# Patient Record
Sex: Female | Born: 1945 | Race: White | Hispanic: No | Marital: Married | Smoking: Former smoker
Health system: Southern US, Community
[De-identification: ages and names within clinical notes are randomized; demographics above are authoritative.]

## PROBLEM LIST (undated history)

## (undated) DIAGNOSIS — I10 Essential (primary) hypertension: Secondary | ICD-10-CM

## (undated) DIAGNOSIS — Z9889 Other specified postprocedural states: Secondary | ICD-10-CM

## (undated) DIAGNOSIS — M1990 Unspecified osteoarthritis, unspecified site: Secondary | ICD-10-CM

## (undated) DIAGNOSIS — J302 Other seasonal allergic rhinitis: Secondary | ICD-10-CM

## (undated) DIAGNOSIS — G5600 Carpal tunnel syndrome, unspecified upper limb: Secondary | ICD-10-CM

## (undated) DIAGNOSIS — Z923 Personal history of irradiation: Secondary | ICD-10-CM

## (undated) DIAGNOSIS — T8859XA Other complications of anesthesia, initial encounter: Secondary | ICD-10-CM

## (undated) DIAGNOSIS — R112 Nausea with vomiting, unspecified: Secondary | ICD-10-CM

## (undated) DIAGNOSIS — G2581 Restless legs syndrome: Principal | ICD-10-CM

## (undated) DIAGNOSIS — D649 Anemia, unspecified: Secondary | ICD-10-CM

## (undated) DIAGNOSIS — M5417 Radiculopathy, lumbosacral region: Secondary | ICD-10-CM

## (undated) DIAGNOSIS — F419 Anxiety disorder, unspecified: Secondary | ICD-10-CM

## (undated) DIAGNOSIS — E785 Hyperlipidemia, unspecified: Secondary | ICD-10-CM

## (undated) DIAGNOSIS — E1140 Type 2 diabetes mellitus with diabetic neuropathy, unspecified: Principal | ICD-10-CM

## (undated) DIAGNOSIS — K219 Gastro-esophageal reflux disease without esophagitis: Secondary | ICD-10-CM

## (undated) HISTORY — DX: Hyperlipidemia, unspecified: E78.5

## (undated) HISTORY — DX: Radiculopathy, lumbosacral region: M54.17

## (undated) HISTORY — PX: KNEE SURGERY: SHX244

## (undated) HISTORY — DX: Essential (primary) hypertension: I10

## (undated) HISTORY — DX: Carpal tunnel syndrome, unspecified upper limb: G56.00

## (undated) HISTORY — PX: LUMBAR DISC SURGERY: SHX700

## (undated) HISTORY — DX: Type 2 diabetes mellitus with diabetic neuropathy, unspecified: E11.40

## (undated) HISTORY — PX: LAMINECTOMY: SHX219

## (undated) HISTORY — DX: Restless legs syndrome: G25.81

---

## 1978-08-31 HISTORY — PX: OTHER SURGICAL HISTORY: SHX169

## 1996-08-31 HISTORY — PX: FINGER SURGERY: SHX640

## 1998-05-08 ENCOUNTER — Other Ambulatory Visit: Admission: RE | Admit: 1998-05-08 | Discharge: 1998-05-08 | Payer: Self-pay | Admitting: Obstetrics and Gynecology

## 1999-05-30 ENCOUNTER — Other Ambulatory Visit: Admission: RE | Admit: 1999-05-30 | Discharge: 1999-05-30 | Payer: Self-pay | Admitting: Obstetrics and Gynecology

## 1999-06-30 ENCOUNTER — Encounter: Payer: Self-pay | Admitting: Obstetrics and Gynecology

## 1999-06-30 ENCOUNTER — Encounter: Admission: RE | Admit: 1999-06-30 | Discharge: 1999-06-30 | Payer: Self-pay | Admitting: Obstetrics and Gynecology

## 2000-06-07 ENCOUNTER — Other Ambulatory Visit: Admission: RE | Admit: 2000-06-07 | Discharge: 2000-06-07 | Payer: Self-pay | Admitting: Obstetrics and Gynecology

## 2001-06-15 ENCOUNTER — Other Ambulatory Visit: Admission: RE | Admit: 2001-06-15 | Discharge: 2001-06-15 | Payer: Self-pay | Admitting: Obstetrics and Gynecology

## 2001-06-21 ENCOUNTER — Encounter: Admission: RE | Admit: 2001-06-21 | Discharge: 2001-06-21 | Payer: Self-pay | Admitting: Obstetrics and Gynecology

## 2001-06-21 ENCOUNTER — Encounter: Payer: Self-pay | Admitting: Obstetrics and Gynecology

## 2002-07-03 ENCOUNTER — Encounter: Payer: Self-pay | Admitting: Obstetrics and Gynecology

## 2002-07-03 ENCOUNTER — Encounter: Admission: RE | Admit: 2002-07-03 | Discharge: 2002-07-03 | Payer: Self-pay | Admitting: Obstetrics and Gynecology

## 2002-08-03 ENCOUNTER — Other Ambulatory Visit: Admission: RE | Admit: 2002-08-03 | Discharge: 2002-08-03 | Payer: Self-pay | Admitting: Obstetrics and Gynecology

## 2003-09-18 ENCOUNTER — Other Ambulatory Visit: Payer: Self-pay

## 2004-01-16 ENCOUNTER — Other Ambulatory Visit: Admission: RE | Admit: 2004-01-16 | Discharge: 2004-01-16 | Payer: Self-pay | Admitting: Obstetrics and Gynecology

## 2004-02-08 ENCOUNTER — Encounter: Admission: RE | Admit: 2004-02-08 | Discharge: 2004-02-08 | Payer: Self-pay | Admitting: Obstetrics and Gynecology

## 2005-02-17 ENCOUNTER — Encounter: Admission: RE | Admit: 2005-02-17 | Discharge: 2005-02-17 | Payer: Self-pay | Admitting: Obstetrics and Gynecology

## 2005-04-09 ENCOUNTER — Encounter: Admission: RE | Admit: 2005-04-09 | Discharge: 2005-04-09 | Payer: Self-pay | Admitting: Family Medicine

## 2006-03-29 ENCOUNTER — Encounter: Admission: RE | Admit: 2006-03-29 | Discharge: 2006-03-29 | Payer: Self-pay | Admitting: Obstetrics and Gynecology

## 2007-04-08 ENCOUNTER — Encounter: Admission: RE | Admit: 2007-04-08 | Discharge: 2007-04-08 | Payer: Self-pay | Admitting: Obstetrics and Gynecology

## 2007-04-26 ENCOUNTER — Other Ambulatory Visit: Payer: Self-pay

## 2007-04-26 ENCOUNTER — Ambulatory Visit: Payer: Self-pay | Admitting: General Practice

## 2007-05-11 ENCOUNTER — Inpatient Hospital Stay: Payer: Self-pay | Admitting: General Practice

## 2008-04-09 ENCOUNTER — Encounter: Admission: RE | Admit: 2008-04-09 | Discharge: 2008-04-09 | Payer: Self-pay | Admitting: Family Medicine

## 2008-04-23 ENCOUNTER — Encounter: Admission: RE | Admit: 2008-04-23 | Discharge: 2008-04-23 | Payer: Self-pay | Admitting: Family Medicine

## 2009-04-29 ENCOUNTER — Encounter: Admission: RE | Admit: 2009-04-29 | Discharge: 2009-04-29 | Payer: Self-pay | Admitting: Obstetrics and Gynecology

## 2010-06-10 ENCOUNTER — Encounter: Admission: RE | Admit: 2010-06-10 | Discharge: 2010-06-10 | Payer: Self-pay | Admitting: Obstetrics and Gynecology

## 2010-09-21 ENCOUNTER — Encounter: Payer: Self-pay | Admitting: Family Medicine

## 2011-02-23 ENCOUNTER — Encounter: Payer: Self-pay | Admitting: Rheumatology

## 2011-03-01 ENCOUNTER — Encounter: Payer: Self-pay | Admitting: Rheumatology

## 2011-07-16 ENCOUNTER — Other Ambulatory Visit: Payer: Self-pay | Admitting: Family Medicine

## 2011-07-16 DIAGNOSIS — Z1231 Encounter for screening mammogram for malignant neoplasm of breast: Secondary | ICD-10-CM

## 2011-08-12 ENCOUNTER — Ambulatory Visit: Payer: Self-pay

## 2011-09-22 ENCOUNTER — Ambulatory Visit: Payer: Self-pay

## 2011-10-01 ENCOUNTER — Ambulatory Visit
Admission: RE | Admit: 2011-10-01 | Discharge: 2011-10-01 | Disposition: A | Discharge status: Home / Self Care | Payer: BC Managed Care – PPO | Source: Ambulatory Visit | Attending: Family Medicine | Admitting: Family Medicine

## 2011-10-01 DIAGNOSIS — Z1231 Encounter for screening mammogram for malignant neoplasm of breast: Secondary | ICD-10-CM

## 2011-10-08 ENCOUNTER — Other Ambulatory Visit: Payer: Self-pay | Admitting: Family Medicine

## 2011-10-08 DIAGNOSIS — R928 Other abnormal and inconclusive findings on diagnostic imaging of breast: Secondary | ICD-10-CM

## 2011-10-20 ENCOUNTER — Ambulatory Visit
Admission: RE | Admit: 2011-10-20 | Discharge: 2011-10-20 | Disposition: A | Discharge status: Home / Self Care | Payer: BC Managed Care – PPO | Source: Ambulatory Visit | Attending: Family Medicine | Admitting: Family Medicine

## 2011-10-20 DIAGNOSIS — R928 Other abnormal and inconclusive findings on diagnostic imaging of breast: Secondary | ICD-10-CM

## 2012-05-09 DIAGNOSIS — M47817 Spondylosis without myelopathy or radiculopathy, lumbosacral region: Secondary | ICD-10-CM | POA: Diagnosis not present

## 2012-05-09 DIAGNOSIS — M5430 Sciatica, unspecified side: Secondary | ICD-10-CM | POA: Diagnosis not present

## 2012-05-16 DIAGNOSIS — M5430 Sciatica, unspecified side: Secondary | ICD-10-CM | POA: Diagnosis not present

## 2012-05-16 DIAGNOSIS — M47817 Spondylosis without myelopathy or radiculopathy, lumbosacral region: Secondary | ICD-10-CM | POA: Diagnosis not present

## 2012-05-17 DIAGNOSIS — H4011X0 Primary open-angle glaucoma, stage unspecified: Secondary | ICD-10-CM | POA: Diagnosis not present

## 2012-07-07 ENCOUNTER — Other Ambulatory Visit: Payer: Self-pay | Admitting: Orthopedic Surgery

## 2012-07-07 DIAGNOSIS — M545 Low back pain, unspecified: Secondary | ICD-10-CM

## 2012-07-07 DIAGNOSIS — M79605 Pain in left leg: Secondary | ICD-10-CM

## 2012-07-13 DIAGNOSIS — E119 Type 2 diabetes mellitus without complications: Secondary | ICD-10-CM | POA: Diagnosis not present

## 2012-07-18 ENCOUNTER — Ambulatory Visit
Admission: RE | Admit: 2012-07-18 | Discharge: 2012-07-18 | Disposition: A | Discharge status: Home / Self Care | Payer: Medicare Other | Source: Ambulatory Visit | Attending: Orthopedic Surgery | Admitting: Orthopedic Surgery

## 2012-07-18 DIAGNOSIS — M5126 Other intervertebral disc displacement, lumbar region: Secondary | ICD-10-CM | POA: Diagnosis not present

## 2012-07-18 DIAGNOSIS — M79605 Pain in left leg: Secondary | ICD-10-CM

## 2012-07-18 DIAGNOSIS — M48061 Spinal stenosis, lumbar region without neurogenic claudication: Secondary | ICD-10-CM | POA: Diagnosis not present

## 2012-07-18 DIAGNOSIS — M545 Low back pain, unspecified: Secondary | ICD-10-CM

## 2012-07-18 MED ORDER — GADOBENATE DIMEGLUMINE 529 MG/ML IV SOLN
15.0000 mL | Freq: Once | INTRAVENOUS | Status: AC | PRN
  Administered 2012-07-18: 15 mL via INTRAVENOUS

## 2012-08-04 DIAGNOSIS — E1142 Type 2 diabetes mellitus with diabetic polyneuropathy: Secondary | ICD-10-CM | POA: Diagnosis not present

## 2012-08-04 DIAGNOSIS — M48061 Spinal stenosis, lumbar region without neurogenic claudication: Secondary | ICD-10-CM | POA: Diagnosis not present

## 2012-08-04 DIAGNOSIS — G2581 Restless legs syndrome: Secondary | ICD-10-CM | POA: Diagnosis not present

## 2012-08-12 DIAGNOSIS — M48061 Spinal stenosis, lumbar region without neurogenic claudication: Secondary | ICD-10-CM | POA: Diagnosis not present

## 2012-09-21 DIAGNOSIS — I10 Essential (primary) hypertension: Secondary | ICD-10-CM | POA: Diagnosis not present

## 2012-09-21 DIAGNOSIS — E119 Type 2 diabetes mellitus without complications: Secondary | ICD-10-CM | POA: Diagnosis not present

## 2012-09-21 DIAGNOSIS — M549 Dorsalgia, unspecified: Secondary | ICD-10-CM | POA: Diagnosis not present

## 2012-09-28 DIAGNOSIS — Z96659 Presence of unspecified artificial knee joint: Secondary | ICD-10-CM | POA: Diagnosis not present

## 2012-09-28 DIAGNOSIS — I10 Essential (primary) hypertension: Secondary | ICD-10-CM | POA: Diagnosis not present

## 2012-09-28 DIAGNOSIS — K219 Gastro-esophageal reflux disease without esophagitis: Secondary | ICD-10-CM | POA: Diagnosis not present

## 2012-09-28 DIAGNOSIS — M549 Dorsalgia, unspecified: Secondary | ICD-10-CM | POA: Diagnosis not present

## 2012-09-28 DIAGNOSIS — E119 Type 2 diabetes mellitus without complications: Secondary | ICD-10-CM | POA: Diagnosis not present

## 2012-09-28 DIAGNOSIS — M5126 Other intervertebral disc displacement, lumbar region: Secondary | ICD-10-CM | POA: Diagnosis not present

## 2012-09-28 DIAGNOSIS — Z79899 Other long term (current) drug therapy: Secondary | ICD-10-CM | POA: Diagnosis not present

## 2012-09-28 DIAGNOSIS — F411 Generalized anxiety disorder: Secondary | ICD-10-CM | POA: Diagnosis not present

## 2012-09-28 DIAGNOSIS — G609 Hereditary and idiopathic neuropathy, unspecified: Secondary | ICD-10-CM | POA: Diagnosis not present

## 2012-09-29 DIAGNOSIS — E119 Type 2 diabetes mellitus without complications: Secondary | ICD-10-CM | POA: Diagnosis not present

## 2012-09-29 DIAGNOSIS — I10 Essential (primary) hypertension: Secondary | ICD-10-CM | POA: Diagnosis not present

## 2012-09-29 DIAGNOSIS — F411 Generalized anxiety disorder: Secondary | ICD-10-CM | POA: Diagnosis not present

## 2012-09-29 DIAGNOSIS — M5126 Other intervertebral disc displacement, lumbar region: Secondary | ICD-10-CM | POA: Diagnosis not present

## 2012-09-29 DIAGNOSIS — K219 Gastro-esophageal reflux disease without esophagitis: Secondary | ICD-10-CM | POA: Diagnosis not present

## 2012-09-29 DIAGNOSIS — G609 Hereditary and idiopathic neuropathy, unspecified: Secondary | ICD-10-CM | POA: Diagnosis not present

## 2013-01-10 DIAGNOSIS — Z0000 Encounter for general adult medical examination without abnormal findings: Secondary | ICD-10-CM | POA: Diagnosis not present

## 2013-01-10 DIAGNOSIS — E1142 Type 2 diabetes mellitus with diabetic polyneuropathy: Secondary | ICD-10-CM | POA: Diagnosis not present

## 2013-01-10 DIAGNOSIS — E782 Mixed hyperlipidemia: Secondary | ICD-10-CM | POA: Diagnosis not present

## 2013-01-10 DIAGNOSIS — J309 Allergic rhinitis, unspecified: Secondary | ICD-10-CM | POA: Diagnosis not present

## 2013-01-10 DIAGNOSIS — F411 Generalized anxiety disorder: Secondary | ICD-10-CM | POA: Diagnosis not present

## 2013-01-10 DIAGNOSIS — E1149 Type 2 diabetes mellitus with other diabetic neurological complication: Secondary | ICD-10-CM | POA: Diagnosis not present

## 2013-01-10 DIAGNOSIS — I10 Essential (primary) hypertension: Secondary | ICD-10-CM | POA: Diagnosis not present

## 2013-01-17 DIAGNOSIS — K219 Gastro-esophageal reflux disease without esophagitis: Secondary | ICD-10-CM | POA: Diagnosis not present

## 2013-01-17 DIAGNOSIS — Z1211 Encounter for screening for malignant neoplasm of colon: Secondary | ICD-10-CM | POA: Diagnosis not present

## 2013-01-17 DIAGNOSIS — D649 Anemia, unspecified: Secondary | ICD-10-CM | POA: Diagnosis not present

## 2013-01-17 DIAGNOSIS — R1319 Other dysphagia: Secondary | ICD-10-CM | POA: Diagnosis not present

## 2013-01-26 ENCOUNTER — Other Ambulatory Visit: Payer: Self-pay

## 2013-01-26 DIAGNOSIS — Z1231 Encounter for screening mammogram for malignant neoplasm of breast: Secondary | ICD-10-CM

## 2013-02-02 ENCOUNTER — Encounter: Payer: Self-pay | Admitting: Neurology

## 2013-02-02 ENCOUNTER — Ambulatory Visit (INDEPENDENT_AMBULATORY_CARE_PROVIDER_SITE_OTHER): Payer: Medicare Other | Admitting: Neurology

## 2013-02-02 VITALS — BP 109/71 | HR 80 | Temp 99.2°F | Ht 65.0 in | Wt 174.0 lb

## 2013-02-02 DIAGNOSIS — E1149 Type 2 diabetes mellitus with other diabetic neurological complication: Secondary | ICD-10-CM | POA: Diagnosis not present

## 2013-02-02 DIAGNOSIS — IMO0002 Reserved for concepts with insufficient information to code with codable children: Secondary | ICD-10-CM

## 2013-02-02 DIAGNOSIS — E1142 Type 2 diabetes mellitus with diabetic polyneuropathy: Secondary | ICD-10-CM

## 2013-02-02 DIAGNOSIS — E1140 Type 2 diabetes mellitus with diabetic neuropathy, unspecified: Secondary | ICD-10-CM

## 2013-02-02 DIAGNOSIS — M5417 Radiculopathy, lumbosacral region: Secondary | ICD-10-CM

## 2013-02-02 HISTORY — DX: Radiculopathy, lumbosacral region: M54.17

## 2013-02-02 HISTORY — DX: Type 2 diabetes mellitus with diabetic neuropathy, unspecified: E11.40

## 2013-02-02 MED ORDER — GABAPENTIN 600 MG PO TABS: 600.0000 mg | ORAL_TABLET | Freq: Four times a day (QID) | ORAL | Status: DC

## 2013-02-02 NOTE — Patient Instructions (Signed)
Peripheral Neuropathy Peripheral neuropathy is a common disorder of your nerves resulting from damage. CAUSES  This disorder may be caused by a disease of the nerves or illness. Many neuropathies have well known causes such as:  Diabetes. This is one of the most common causes.   Uremia.   AIDS.   Nutritional deficiencies.   Other causes include mechanical pressures. These may be from:   Compression.   Injury.   Contusions or bruises.   Fracture or dislocated bones.   Pressure involving the nerves close to the surface. Nerves such as the ulnar, or radial can be injured by prolonged use of crutches.  Other injuries may come from:  Tumor.   Hemorrhage or bleeding into a nerve.   Exposure to cold or radiation.   Certain medicines or toxic substances (rare).   Vascular or collagen disorders such as:   Atherosclerosis.   Systemic lupus erythematosus.   Scleroderma.   Sarcoidosis.   Rheumatoid arthritis.   Polyarteritis nodosa.   A large number of cases are of unknown cause.  SYMPTOMS  Common problems include:  Weakness.   Numbness.   Abnormal sensations (paresthesia) such as:   Burning.   Tickling.   Pricking.   Tingling.   Pain in the arms, hands, legs and/or feet.  TREATMENT  Therapy for this disorder differs depending on the cause. It may vary from medical treatment with medications or physical therapy among others.   For example, therapy for this disorder caused by diabetes involves control of the diabetes.   In cases where a tumor or ruptured disc is the cause, therapy may involve surgery. This would be to remove the tumor or to repair the ruptured disc.   In entrapment or compression neuropathy, treatment may consist of splinting or surgical decompression of the ulnar or median nerves. A common example of entrapment neuropathy is carpal tunnel syndrome. This has become more common because of the increasing use of computers.   Peroneal and  radial compression neuropathies may require avoidance of pressure.   Physical therapy and/or splints may be useful in preventing contractures. This is a condition in which shortened muscles around joints cause abnormal and sometimes painful positioning of the joints.  Document Released: 08/07/2002 Document Revised: 04/29/2011 Document Reviewed: 08/17/2005 Exit[employer] Patient Information 2012 [employer]. 

## 2013-02-02 NOTE — Progress Notes (Addendum)
[facility]  Provider:  Dr Vickey Huger Referring Provider: No ref. provider found Primary Care Physician:  Astrid Divine, MD  Chief Complaint  Patient presents with  . Follow-up    polyneuropathy,rm 10    HPI:  ABBIGAILE ROCKMAN is a 67 y.o. female here as a  Revisit .  Mrs. Screws  primary neurologic diagnosis is polyneuropathy. I have seen the patient in 2004 for back pain and lumbal radiculopathy, and neeeded no follow up  after a microdiscectomy at [facility] the same year  .    She has been seen again since 2012 in this office. An EMG and nerve conduction study in early 2012 showed mild to moderate bilateral median neuropathy across the wrist,  consistent with carpal tunnel syndrome.  She also was prescribed gabapentin,and has continued to take it for  relief of symptoms. Her dose has increased. During the first  Quarter of 2013,  the patient reported that neuropathy now affected her lower extremities a little worse and  gabapentin was increased to 600 mg 4 times daily. At the time her HbA1c level was 6.2 ,   A MRI of her lower back was ordered, she was diagnosed with a bulging disc between L5 and S1 and in December 2013 had planned to see a neurosurgeon at [facility]. She underwent neurosurgery on January 29th 2014.  Her back problems have done very well since surgery. Her lower extremity neuropathy has not been affected by the surgery. The bottom of her left foot is  numb since the surgery. Since she is retired and has no longer repetitive injuries to her wrist, carpal tunnel syndrome has resolved. She still takes cup attention but for the lower extremity neuropathy..     Review of Systems: Out of a complete 14 system review, the patient complains of only the following symptoms, and all other reviewed systems are negative.  numbness , pin and needle dysesthesias, tingling : in lower extremities /feet   History   Social History  . Marital Status: Married    Spouse Name: N/A    Number of Children: 2  . Years of Education: N/A   Occupational History  . 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 Smoking status: Never Smoker   . Smokeless tobacco: Not on file  . Alcohol Use: Yes     Comment: socially  . Drug Use: No  . Sexually Active: Not on file   Other Topics Concern  . Not on file   Social History Narrative  . No narrative on file    Family History  Problem Relation Age of Onset  . Diabetes Son     Past Medical History  Diagnosis Date  . Hypertension   . Hyperlipemia   . CTS (carpal tunnel syndrome)     bil, by EMG/[place]    Past Surgical History  Procedure Laterality Date  . Kidney stones  1980  . Lumbar disc surgery    . Laminectomy      l4-5  . Knee surgery Bilateral     partial right-2008,left full replacement-2006-Dr Hooten,[facility]  . Finger surgery Right 1998    Hooten    Current Outpatient Prescriptions  Medication Sig Dispense Refill  . aspirin 81 MG tablet Take 81 mg by mouth daily.      . celecoxib (CELEBREX) 200 MG capsule Take 200 mg by mouth daily.      Marland Kitchen  escitalopram (LEXAPRO) 10 MG tablet Take 10 mg by mouth daily.      Marland Kitchen gabapentin (NEURONTIN) 800 MG tablet Take 800 mg by mouth 3 (three) times daily.      Marland Kitchen lisinopril-hydrochlorothiazide (PRINZIDE,ZESTORETIC) 20-12.5 MG per tablet Take 1 tablet by mouth daily.      . metFORMIN (GLUCOPHAGE) 500 MG tablet Take 500 mg by mouth 2 (two) times daily. At bedtime      . Multiple Vitamin (MULTIVITAMIN) capsule Take 1 capsule by mouth daily.      . pantoprazole (PROTONIX) 40 MG tablet Take 40 mg by mouth daily.      . rosuvastatin (CRESTOR) 10 MG tablet Take 10 mg by mouth daily.      . fluticasone (FLONASE) 50 MCG/ACT nasal spray        No current facility-administered medications for this visit.    Allergies as of 02/02/2013 - Review Complete 02/02/2013  Allergen Reaction Noted  . Codeine   02/02/2013  . Latex  02/02/2013  . Morphine and related  02/02/2013  . Nickel  02/02/2013  . Penicillins  02/02/2013    Vitals: BP 109/71  Pulse 80  Temp(Src) 99.2 F (37.3 C) (Oral)  Ht 5\' 5"  (1.651 m)  Wt 174 lb (78.926 kg)  BMI 28.96 kg/m2 Last Weight:  Wt Readings from Last 1 Encounters:  02/02/13 174 lb (78.926 kg)   Last Height:   Ht Readings from Last 1 Encounters:  02/02/13 5\' 5"  (1.651 m)   Vision Screening:  See vitals  Physical exam:  General: The patient is awake, alert and appears not in acute distress. The patient is well groomed. Head: Normocephalic, atraumatic. Neck is supple. Mallampati 2   Cardiovascular:  Regular rate and rhythm, without  murmurs or carotid bruit, and without distended neck veins. Respiratory: Lungs are clear to auscultation. Skin:  Without evidence of edema, or rash Trunk: BMI is elevated and patient  has normal posture.  Neurologic exam : The patient is awake and alert, oriented to place and time.  Memory subjective  described as intact. There is a normal attention span & concentration ability.  Speech is fluent without  dysarthria, dysphonia or aphasia. Mood and affect are appropriate.  Cranial nerves: Pupils are equal and briskly reactive to light. Funduscopic exam without  evidence of pallor or edema. Extraocular movements  in vertical and horizontal planes intact and without nystagmus. Visual fields by finger perimetry are intact. Hearing to finger rub intact.  Facial sensation intact to fine touch. Facial motor strength is symmetric and tongue and uvula move midline.  Motor exam:   Normal tone and normal muscle bulk and symmetric normal strength in all extremities. She had initially not provided full strength with left foot dorsi-flexion, but repeat testing was normal.  Good bilateral grip strength.   Sensory:  Fine touch, pinprick and vibration were tested in all extremities. Feet are numb at the toes, the left  Foot lateral and  plantar  is numb ( s1 )  Proprioception is tested in the upper extremities only. .  Coordination: Rapid alternating movements in the fingers/hands is tested and normal. Finger-to-nose maneuver tested and normal without evidence of ataxia, dysmetria or tremor.  Gait and station: Patient walks without assistive device-Strength within normal limits. Stance is stable and normal. Tandem gait is un fragmented. Romberg testing is normal.    Deep tendon reflexes: in the upper and lower extremities are symmetric and intact. Babinski maneuver response is down going.   Assessment:  After physical and neurologic examination, review of laboratory studies, imaging, neurophysiology testing and pre-existing records, assessment will be reviewed on the problem list.   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 Interestingly the patient will presented already 10 years ago for some peripheral neuropathy signs recovered fully until she had her right knee replacement surgery since and she has also had recurrent lower back problems and a left-sided full knee replacement. She is recovering from a repeat surgery for  L5-S1 radiculopathy very well.   Continued treatment with Neurontin-Gabapentin.     Plan:  Treatment plan and additional workup will be reviewed under Problem List.  Refills for gabapentin. Encouraged to try Metanx.  RV in 12 month .

## 2013-02-16 ENCOUNTER — Other Ambulatory Visit: Payer: Self-pay | Admitting: Obstetrics and Gynecology

## 2013-02-16 DIAGNOSIS — Z124 Encounter for screening for malignant neoplasm of cervix: Secondary | ICD-10-CM | POA: Diagnosis not present

## 2013-02-16 DIAGNOSIS — Z01419 Encounter for gynecological examination (general) (routine) without abnormal findings: Secondary | ICD-10-CM | POA: Diagnosis not present

## 2013-02-16 DIAGNOSIS — E2839 Other primary ovarian failure: Secondary | ICD-10-CM

## 2013-02-28 ENCOUNTER — Ambulatory Visit: Payer: Medicare Other

## 2013-03-06 ENCOUNTER — Ambulatory Visit
Admission: RE | Admit: 2013-03-06 | Discharge: 2013-03-06 | Disposition: A | Discharge status: Home / Self Care | Payer: Medicare Other | Source: Ambulatory Visit

## 2013-03-06 ENCOUNTER — Ambulatory Visit
Admission: RE | Admit: 2013-03-06 | Discharge: 2013-03-06 | Disposition: A | Discharge status: Home / Self Care | Payer: Medicare Other | Source: Ambulatory Visit | Attending: Obstetrics and Gynecology | Admitting: Obstetrics and Gynecology

## 2013-03-06 DIAGNOSIS — Z1231 Encounter for screening mammogram for malignant neoplasm of breast: Secondary | ICD-10-CM

## 2013-03-06 DIAGNOSIS — Z780 Asymptomatic menopausal state: Secondary | ICD-10-CM | POA: Diagnosis not present

## 2013-03-06 DIAGNOSIS — E2839 Other primary ovarian failure: Secondary | ICD-10-CM

## 2013-03-15 ENCOUNTER — Telehealth: Payer: Self-pay | Admitting: Neurology

## 2013-03-15 DIAGNOSIS — M5417 Radiculopathy, lumbosacral region: Secondary | ICD-10-CM

## 2013-03-15 DIAGNOSIS — E1140 Type 2 diabetes mellitus with diabetic neuropathy, unspecified: Secondary | ICD-10-CM

## 2013-03-15 MED ORDER — GABAPENTIN 800 MG PO TABS: 800.0000 mg | ORAL_TABLET | Freq: Four times a day (QID) | ORAL | Status: DC

## 2013-03-15 NOTE — Telephone Encounter (Signed)
Per last OV in [facility]: Continue  Neurontin but increase to 800 mg , alternating with 600mg  for a few weeks then 800mg  QID.   I called the patient.  She verified she is taking 800mg  QID.  It appears 600mg  QID was entered in Epic in error.  I have updated and resent Rx for the dose patient has been taking.  Patient is aware Rx has been sent.

## 2013-03-22 DIAGNOSIS — Z1211 Encounter for screening for malignant neoplasm of colon: Secondary | ICD-10-CM | POA: Diagnosis not present

## 2013-03-22 DIAGNOSIS — D126 Benign neoplasm of colon, unspecified: Secondary | ICD-10-CM | POA: Diagnosis not present

## 2013-06-20 DIAGNOSIS — Z23 Encounter for immunization: Secondary | ICD-10-CM | POA: Diagnosis not present

## 2013-08-01 DIAGNOSIS — E119 Type 2 diabetes mellitus without complications: Secondary | ICD-10-CM | POA: Diagnosis not present

## 2013-08-08 DIAGNOSIS — E1142 Type 2 diabetes mellitus with diabetic polyneuropathy: Secondary | ICD-10-CM | POA: Diagnosis not present

## 2013-08-08 DIAGNOSIS — I10 Essential (primary) hypertension: Secondary | ICD-10-CM | POA: Diagnosis not present

## 2013-08-08 DIAGNOSIS — E782 Mixed hyperlipidemia: Secondary | ICD-10-CM | POA: Diagnosis not present

## 2013-08-08 DIAGNOSIS — E1149 Type 2 diabetes mellitus with other diabetic neurological complication: Secondary | ICD-10-CM | POA: Diagnosis not present

## 2013-08-08 DIAGNOSIS — R809 Proteinuria, unspecified: Secondary | ICD-10-CM | POA: Diagnosis not present

## 2013-08-08 DIAGNOSIS — E1129 Type 2 diabetes mellitus with other diabetic kidney complication: Secondary | ICD-10-CM | POA: Diagnosis not present

## 2013-09-06 DIAGNOSIS — H2510 Age-related nuclear cataract, unspecified eye: Secondary | ICD-10-CM | POA: Diagnosis not present

## 2013-09-11 ENCOUNTER — Ambulatory Visit: Payer: Self-pay | Admitting: Ophthalmology

## 2013-09-11 DIAGNOSIS — Z01812 Encounter for preprocedural laboratory examination: Secondary | ICD-10-CM | POA: Diagnosis not present

## 2013-09-11 DIAGNOSIS — I10 Essential (primary) hypertension: Secondary | ICD-10-CM | POA: Diagnosis not present

## 2013-09-11 DIAGNOSIS — H2510 Age-related nuclear cataract, unspecified eye: Secondary | ICD-10-CM | POA: Diagnosis not present

## 2013-09-11 DIAGNOSIS — Z79899 Other long term (current) drug therapy: Secondary | ICD-10-CM | POA: Diagnosis not present

## 2013-09-11 DIAGNOSIS — Z01810 Encounter for preprocedural cardiovascular examination: Secondary | ICD-10-CM | POA: Diagnosis not present

## 2013-09-11 LAB — POTASSIUM: Potassium: 3.9 mmol/L (ref 3.5–5.1)

## 2013-09-18 ENCOUNTER — Ambulatory Visit: Payer: Self-pay | Admitting: Ophthalmology

## 2013-09-18 DIAGNOSIS — Z7982 Long term (current) use of aspirin: Secondary | ICD-10-CM | POA: Diagnosis not present

## 2013-09-18 DIAGNOSIS — M1990 Unspecified osteoarthritis, unspecified site: Secondary | ICD-10-CM | POA: Diagnosis not present

## 2013-09-18 DIAGNOSIS — Z96659 Presence of unspecified artificial knee joint: Secondary | ICD-10-CM | POA: Diagnosis not present

## 2013-09-18 DIAGNOSIS — E119 Type 2 diabetes mellitus without complications: Secondary | ICD-10-CM | POA: Diagnosis not present

## 2013-09-18 DIAGNOSIS — G589 Mononeuropathy, unspecified: Secondary | ICD-10-CM | POA: Diagnosis not present

## 2013-09-18 DIAGNOSIS — E7800 Pure hypercholesterolemia, unspecified: Secondary | ICD-10-CM | POA: Diagnosis not present

## 2013-09-18 DIAGNOSIS — Z880 Allergy status to penicillin: Secondary | ICD-10-CM | POA: Diagnosis not present

## 2013-09-18 DIAGNOSIS — K219 Gastro-esophageal reflux disease without esophagitis: Secondary | ICD-10-CM | POA: Diagnosis not present

## 2013-09-18 DIAGNOSIS — I10 Essential (primary) hypertension: Secondary | ICD-10-CM | POA: Diagnosis not present

## 2013-09-18 DIAGNOSIS — H2510 Age-related nuclear cataract, unspecified eye: Secondary | ICD-10-CM | POA: Diagnosis not present

## 2013-09-18 DIAGNOSIS — Z87891 Personal history of nicotine dependence: Secondary | ICD-10-CM | POA: Diagnosis not present

## 2013-09-18 DIAGNOSIS — Z91040 Latex allergy status: Secondary | ICD-10-CM | POA: Diagnosis not present

## 2013-09-18 DIAGNOSIS — Z885 Allergy status to narcotic agent status: Secondary | ICD-10-CM | POA: Diagnosis not present

## 2013-09-18 DIAGNOSIS — H269 Unspecified cataract: Secondary | ICD-10-CM | POA: Diagnosis not present

## 2013-10-16 DIAGNOSIS — E119 Type 2 diabetes mellitus without complications: Secondary | ICD-10-CM | POA: Diagnosis not present

## 2013-10-16 DIAGNOSIS — S92919A Unspecified fracture of unspecified toe(s), initial encounter for closed fracture: Secondary | ICD-10-CM | POA: Diagnosis not present

## 2013-11-30 ENCOUNTER — Telehealth: Payer: Self-pay | Admitting: *Deleted

## 2013-11-30 NOTE — Telephone Encounter (Signed)
Patient was called to change her appointment time per Dr. Brett Fairy.and left a message to call the office and ask for Dr. Edwena Felty assistant to schedule.

## 2013-12-01 NOTE — Telephone Encounter (Signed)
Patient was notified and okay with coming in at 9:00 am instead of 1 pm to see Dr. Brett Fairy.

## 2014-02-02 ENCOUNTER — Encounter (INDEPENDENT_AMBULATORY_CARE_PROVIDER_SITE_OTHER): Payer: Self-pay

## 2014-02-02 ENCOUNTER — Ambulatory Visit (INDEPENDENT_AMBULATORY_CARE_PROVIDER_SITE_OTHER): Payer: Medicare Other | Admitting: Neurology

## 2014-02-02 ENCOUNTER — Encounter: Payer: Self-pay | Admitting: Neurology

## 2014-02-02 VITALS — BP 123/75 | HR 71 | Resp 14 | Ht 65.5 in | Wt 170.0 lb

## 2014-02-02 DIAGNOSIS — E1149 Type 2 diabetes mellitus with other diabetic neurological complication: Secondary | ICD-10-CM | POA: Diagnosis not present

## 2014-02-02 DIAGNOSIS — E1140 Type 2 diabetes mellitus with diabetic neuropathy, unspecified: Secondary | ICD-10-CM

## 2014-02-02 DIAGNOSIS — M5136 Other intervertebral disc degeneration, lumbar region: Secondary | ICD-10-CM | POA: Insufficient documentation

## 2014-02-02 DIAGNOSIS — M51369 Other intervertebral disc degeneration, lumbar region without mention of lumbar back pain or lower extremity pain: Secondary | ICD-10-CM

## 2014-02-02 DIAGNOSIS — E1142 Type 2 diabetes mellitus with diabetic polyneuropathy: Secondary | ICD-10-CM

## 2014-02-02 DIAGNOSIS — M5137 Other intervertebral disc degeneration, lumbosacral region: Secondary | ICD-10-CM | POA: Diagnosis not present

## 2014-02-02 DIAGNOSIS — IMO0002 Reserved for concepts with insufficient information to code with codable children: Secondary | ICD-10-CM | POA: Diagnosis not present

## 2014-02-02 DIAGNOSIS — M5417 Radiculopathy, lumbosacral region: Secondary | ICD-10-CM

## 2014-02-02 MED ORDER — GABAPENTIN 800 MG PO TABS: 800.0000 mg | ORAL_TABLET | Freq: Four times a day (QID) | ORAL | Status: DC

## 2014-02-02 NOTE — Progress Notes (Signed)
[facility]  Provider:  Dr Mckaila Duffus Referring Provider: Kelton Pillar, MD Primary Care Physician:  Osborne Casco, MD  Chief Complaint  Patient presents with  . Follow-up    Room 11  . Peripheral Neuropathy  . Back Pain    HPI:  Theresa Ferguson is a 68 y.o. female here as a  Revisit .  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 I have seen the patient in 2004 for back pain and lumbal radiculopathy, and neeeded no follow up for a while  after a microdiscectomy at [place] the same year  .    She has been seen again since 2012 in this office.  Meanwhile she had been diagnosed with diabetes in 2005 . An EMG and nerve conduction study in early 2012 showed mild to moderate bilateral median neuropathy across the wrist,  consistent with carpal tunnel syndrome. She also was prescribed gabapentin,and has continued to take it for relief of symptoms. Her dose has increased to 600 mg 4 times daily. At the time her HbA1c level was 6.2 ,  A MRI of her lower back was ordered, she was diagnosed with a bulging disc between L5 and S1 and referred neurosurgeon at [facility].  She underwent neurosurgery on January 29th 2014.  Her back problems have improved  well since surgery . She has no new symptoms.  Her lower extremity neuropathy has not been affected by the surgery.  The bottom of her left foot is  numb since the surgery. 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 .      Review of Systems: Out of a complete 14 system review, the patient complains of only the following symptoms, and all other reviewed systems are negative.  numbness , pin and needle dysesthesias, tingling : in lower extremities /feet   History   Social History  . Marital Status: Married    Spouse Name: Jeneen Rinks    Number of Children: 2  . Years of Education: College    Occupational History  . 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 Smoking status: Former Research scientist (life sciences)  . Smokeless tobacco: Never Used  . Alcohol Use: Yes     Comment: socially  . Drug Use: No  . Sexual Activity: Not on file   Other Topics Concern  . Not on file   Social History Narrative   Patient is married Jeneen Rinks) and lives at home with her husband.   Patient has two adult children.   Patient is retired.   Patient has a college education.   Patient is right-handed.   Patient drinks two cups of coffee daily.    Family History  Problem Relation Age of Onset  . Diabetes Son     Past Medical History  Diagnosis Date  . Hypertension   . Hyperlipemia   . CTS (carpal tunnel syndrome)     bil, by EMG/[place]  . Neuropathy in diabetes 02/02/2013  . Radiculopathy of lumbosacral region 02/02/2013    Past Surgical History  Procedure Laterality Date  . Kidney stones  1980  . Lumbar disc surgery    . Laminectomy      l4-5  . Knee surgery Bilateral     partial right-2008,left full replacement-2006-Dr Hooten,[facility]  . Finger surgery Right 1998  [facility]    Current Outpatient Prescriptions  Medication Sig Dispense Refill  . aspirin 81 MG tablet Take 81 mg by mouth daily.      . celecoxib (CELEBREX) 200 MG capsule Take 200 mg by mouth daily.      Marland Kitchen escitalopram (LEXAPRO) 10 MG tablet Take 10 mg by mouth daily.      . fluticasone (FLONASE) 50 MCG/ACT nasal spray       . gabapentin (NEURONTIN) 800 MG tablet Take 1 tablet (800 mg total) by mouth QID.  360 tablet  3  . lisinopril-hydrochlorothiazide (PRINZIDE,ZESTORETIC) 20-12.5 MG per tablet Take 1 tablet by mouth daily.      . metFORMIN (GLUCOPHAGE) 500 MG tablet Take 500 mg by mouth 2 (two) times daily. At bedtime      . Multiple Vitamin (MULTIVITAMIN) capsule Take 1 capsule by mouth daily.      . pantoprazole (PROTONIX) 40 MG tablet Take 40 mg by mouth daily.      .  rosuvastatin (CRESTOR) 10 MG tablet Take 10 mg by mouth daily.       No current facility-administered medications for this visit.    Allergies as of 02/02/2014 - Review Complete 02/02/2014  Allergen Reaction Noted  . Codeine  02/02/2013  . Latex  02/02/2013  . Morphine and related  02/02/2013  . Nickel  02/02/2013  . Penicillins  02/02/2013    Vitals: BP 123/75  Pulse 71  Resp 14  Ht 5' 5.5" (1.664 m)  Wt 170 lb (77.111 kg)  BMI 27.85 kg/m2 Last Weight:  Wt Readings from Last 1 Encounters:  02/02/14 170 lb (77.111 kg)   Last Height:   Ht Readings from Last 1 Encounters:  02/02/14 5' 5.5" (1.664 m)   Vision Screening:  See vitals  Physical exam:  General: The patient is awake, alert and appears not in acute distress. The patient is well groomed. Head: Normocephalic, atraumatic. Neck is supple. Mallampati 2 , 16 inches neck.   Cardiovascular:  Regular rate and rhythm, without murmurs or carotid bruit, and without distended neck veins. Respiratory: Lungs are clear to auscultation. Skin:  Without evidence of edema, or rash Trunk: BMI is elevated and patient  has normal posture.  Neurologic exam : The patient is awake and alert, oriented to place and time.   Memory subjective  described as intact. There is a normal attention span & concentration ability.  Speech is fluent without  dysarthria, dysphonia or aphasia. Mood and affect are appropriate.  Cranial nerves: Pupils are equal and briskly reactive to light. Funduscopic exam without  evidence of pallor or edema. Extraocular movements  in vertical and horizontal planes intact and without nystagmus. Visual fields by finger perimetry are intact. Hearing to finger rub intact.  Facial sensation intact to fine touch. Facial motor strength is symmetric and tongue and uvula move midline.  Motor exam:   Normal tone and normal muscle bulk and symmetric normal strength in all extremities. She had initially not provided full strength  with left foot dorsi-flexion, but repeat testing was normal.  Good bilateral grip strength.   Sensory:  Fine touch, pinprick and vibration were tested in all extremities. Feet are numb at the toes, the left foot's lateral aspect  and plantar area is numb ( S1 ) . Coordination: Rapid alternating movements in the fingers/hands is tested and normal. Finger-to-nose maneuver tested and normal without evidence of ataxia, dysmetria or tremor.  Gait and station: Patient walks without assistive device-Strength within normal limits.  Stance is stable and normal. Tandem gait is un fragmented. Romberg testing is normal.  Deep tendon reflexes: in the upper and lower extremities are symmetric and intact. Babinski maneuver response is down going.  Assessment:  After physical and neurologic examination, review of laboratory studies, imaging, neurophysiology testing and pre-existing records, assessment will be reviewed on the problem list.   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 Interestingly the patient presented already 10 years ago for some peripheral neuropathy signs , at that time recovered fully -until she had her right knee replacement surgery . RLS questionnaire  endorsed mild impairment . GDS zero, Epworth 4 and FSS 23.    Continued treatment with Neurontin-Gabapentin.   Plan:  Treatment plan and additional workup :    Refills for gabapentin. Encouraged to continue B complex.  RV in 12 month .

## 2014-02-07 DIAGNOSIS — E1149 Type 2 diabetes mellitus with other diabetic neurological complication: Secondary | ICD-10-CM | POA: Diagnosis not present

## 2014-02-15 DIAGNOSIS — Z0000 Encounter for general adult medical examination without abnormal findings: Secondary | ICD-10-CM | POA: Diagnosis not present

## 2014-02-15 DIAGNOSIS — Z23 Encounter for immunization: Secondary | ICD-10-CM | POA: Diagnosis not present

## 2014-02-15 DIAGNOSIS — E1142 Type 2 diabetes mellitus with diabetic polyneuropathy: Secondary | ICD-10-CM | POA: Diagnosis not present

## 2014-02-15 DIAGNOSIS — E782 Mixed hyperlipidemia: Secondary | ICD-10-CM | POA: Diagnosis not present

## 2014-02-15 DIAGNOSIS — E1129 Type 2 diabetes mellitus with other diabetic kidney complication: Secondary | ICD-10-CM | POA: Diagnosis not present

## 2014-02-15 DIAGNOSIS — J309 Allergic rhinitis, unspecified: Secondary | ICD-10-CM | POA: Diagnosis not present

## 2014-02-15 DIAGNOSIS — E1149 Type 2 diabetes mellitus with other diabetic neurological complication: Secondary | ICD-10-CM | POA: Diagnosis not present

## 2014-02-15 DIAGNOSIS — R809 Proteinuria, unspecified: Secondary | ICD-10-CM | POA: Diagnosis not present

## 2014-02-15 DIAGNOSIS — I10 Essential (primary) hypertension: Secondary | ICD-10-CM | POA: Diagnosis not present

## 2014-03-15 DIAGNOSIS — H02839 Dermatochalasis of unspecified eye, unspecified eyelid: Secondary | ICD-10-CM | POA: Diagnosis not present

## 2014-05-10 DIAGNOSIS — H02839 Dermatochalasis of unspecified eye, unspecified eyelid: Secondary | ICD-10-CM | POA: Diagnosis not present

## 2014-07-12 DIAGNOSIS — M65341 Trigger finger, right ring finger: Secondary | ICD-10-CM | POA: Diagnosis not present

## 2014-07-12 DIAGNOSIS — M67431 Ganglion, right wrist: Secondary | ICD-10-CM | POA: Diagnosis not present

## 2014-07-18 DIAGNOSIS — S61219A Laceration without foreign body of unspecified finger without damage to nail, initial encounter: Secondary | ICD-10-CM | POA: Diagnosis not present

## 2014-08-06 ENCOUNTER — Other Ambulatory Visit: Payer: Self-pay

## 2014-08-06 DIAGNOSIS — Z1231 Encounter for screening mammogram for malignant neoplasm of breast: Secondary | ICD-10-CM

## 2014-08-10 ENCOUNTER — Ambulatory Visit
Admission: RE | Admit: 2014-08-10 | Discharge: 2014-08-10 | Disposition: A | Discharge status: Home / Self Care | Payer: Medicare Other | Source: Ambulatory Visit

## 2014-08-10 ENCOUNTER — Encounter (INDEPENDENT_AMBULATORY_CARE_PROVIDER_SITE_OTHER): Payer: Self-pay

## 2014-08-10 DIAGNOSIS — Z1231 Encounter for screening mammogram for malignant neoplasm of breast: Secondary | ICD-10-CM | POA: Diagnosis not present

## 2014-08-13 DIAGNOSIS — E1149 Type 2 diabetes mellitus with other diabetic neurological complication: Secondary | ICD-10-CM | POA: Diagnosis not present

## 2014-08-13 DIAGNOSIS — Z23 Encounter for immunization: Secondary | ICD-10-CM | POA: Diagnosis not present

## 2014-08-13 DIAGNOSIS — E782 Mixed hyperlipidemia: Secondary | ICD-10-CM | POA: Diagnosis not present

## 2014-08-13 DIAGNOSIS — N182 Chronic kidney disease, stage 2 (mild): Secondary | ICD-10-CM | POA: Diagnosis not present

## 2014-08-13 DIAGNOSIS — E1121 Type 2 diabetes mellitus with diabetic nephropathy: Secondary | ICD-10-CM | POA: Diagnosis not present

## 2014-08-13 DIAGNOSIS — I129 Hypertensive chronic kidney disease with stage 1 through stage 4 chronic kidney disease, or unspecified chronic kidney disease: Secondary | ICD-10-CM | POA: Diagnosis not present

## 2014-08-17 DIAGNOSIS — H40003 Preglaucoma, unspecified, bilateral: Secondary | ICD-10-CM | POA: Diagnosis not present

## 2014-08-21 DIAGNOSIS — H2512 Age-related nuclear cataract, left eye: Secondary | ICD-10-CM | POA: Diagnosis not present

## 2014-09-20 ENCOUNTER — Ambulatory Visit: Payer: Self-pay | Admitting: Ophthalmology

## 2014-09-20 DIAGNOSIS — Z01812 Encounter for preprocedural laboratory examination: Secondary | ICD-10-CM | POA: Diagnosis not present

## 2014-09-20 DIAGNOSIS — I10 Essential (primary) hypertension: Secondary | ICD-10-CM | POA: Diagnosis not present

## 2014-09-20 DIAGNOSIS — Z01810 Encounter for preprocedural cardiovascular examination: Secondary | ICD-10-CM | POA: Diagnosis not present

## 2014-09-20 DIAGNOSIS — H2512 Age-related nuclear cataract, left eye: Secondary | ICD-10-CM | POA: Diagnosis not present

## 2014-09-20 LAB — POTASSIUM: Potassium: 4 mmol/L (ref 3.5–5.1)

## 2014-09-24 ENCOUNTER — Ambulatory Visit: Payer: Self-pay | Admitting: Ophthalmology

## 2014-09-24 DIAGNOSIS — Z87442 Personal history of urinary calculi: Secondary | ICD-10-CM | POA: Diagnosis not present

## 2014-09-24 DIAGNOSIS — Z880 Allergy status to penicillin: Secondary | ICD-10-CM | POA: Diagnosis not present

## 2014-09-24 DIAGNOSIS — E780 Pure hypercholesterolemia: Secondary | ICD-10-CM | POA: Diagnosis not present

## 2014-09-24 DIAGNOSIS — M1990 Unspecified osteoarthritis, unspecified site: Secondary | ICD-10-CM | POA: Diagnosis not present

## 2014-09-24 DIAGNOSIS — G629 Polyneuropathy, unspecified: Secondary | ICD-10-CM | POA: Diagnosis not present

## 2014-09-24 DIAGNOSIS — H2512 Age-related nuclear cataract, left eye: Secondary | ICD-10-CM | POA: Diagnosis not present

## 2014-09-24 DIAGNOSIS — Z885 Allergy status to narcotic agent status: Secondary | ICD-10-CM | POA: Diagnosis not present

## 2014-09-24 DIAGNOSIS — I10 Essential (primary) hypertension: Secondary | ICD-10-CM | POA: Diagnosis not present

## 2014-09-24 DIAGNOSIS — K219 Gastro-esophageal reflux disease without esophagitis: Secondary | ICD-10-CM | POA: Diagnosis not present

## 2014-09-24 DIAGNOSIS — E119 Type 2 diabetes mellitus without complications: Secondary | ICD-10-CM | POA: Diagnosis not present

## 2014-11-21 DIAGNOSIS — S59901A Unspecified injury of right elbow, initial encounter: Secondary | ICD-10-CM | POA: Diagnosis not present

## 2014-11-21 DIAGNOSIS — M25521 Pain in right elbow: Secondary | ICD-10-CM | POA: Diagnosis not present

## 2014-11-21 DIAGNOSIS — M25421 Effusion, right elbow: Secondary | ICD-10-CM | POA: Diagnosis not present

## 2014-12-08 DIAGNOSIS — M25531 Pain in right wrist: Secondary | ICD-10-CM | POA: Diagnosis not present

## 2014-12-22 NOTE — Op Note (Signed)
PATIENT NAME:  Theresa Ferguson, Theresa Ferguson MR#:  [id number] DATE OF BIRTH:  14-Mar-1946  DATE OF PROCEDURE:  09/18/2013  PREOPERATIVE DIAGNOSIS:  Cataract, right eye.   POSTOPERATIVE DIAGNOSIS:  Cataract, right eye.   PROCEDURE PERFORMED:  Extracapsular cataract extraction using phacoemulsification with placement of Alcon SN6CWS 14.5-diopter posterior chamber lens, serial number [id number].   ANESTHESIA:  4% lidocaine, 0.75% Marcaine 50-50 mixture with 10 units/mL of Hylenex added given as peribulbar.   ANESTHESIOLOGIST:  Dr. Myra Gianotti.   COMPLICATIONS:  None.   ESTIMATED BLOOD LOSS:  Less than 1 mL.   DESCRIPTION OF PROCEDURE:  The patient was brought to the operating room and was given IV sedation and a peribulbar block. She was prepped and draped in the usual fashion. The vertical rectus muscles were imbricated using 5-0 silk sutures, bridle sutures. A limbal peritomy was carried out. Hemostasis was obtained at the superior limbus, and a partial-thickness scleral groove was made. This was dissected anteriorly in clear cornea with an Alcon crescent knife. The anterior chamber was entered superonasally through clear cornea with a paracentesis knife and through the lamellar dissection with a 2.6-mm keratome. DisCoVisc was used to replace the aqueous, and continuous tear circular capsulorrhexis was carried out. Hydrodissection was used to loosen the nucleus, and phacoemulsification was carried out in divide and conquer technique. Ultrasound time was 1 minute 45 seconds with average power of 25.8%, CDE 47.40. Irrigation and aspiration was used to remove the residual cortex. The capsular bag was inflated with DisCoVisc, and the intraocular lens was inserted into the bag using a Librarian, academic. Irrigation and aspiration was used to remove the residual DisCoVisc. The wound was inflated with balanced salt, and Miochol was injected via the paracentesis tract. The wound was checked for leaks. None were found. One-tenth of  a milliliter of Vigamox was injected in the anterior chamber. The Vigamox had been cut one-tenth of a milliliter to four-tenths of a milliliter of balanced salt and contained a tenth of a milligram of drug. The wound was checked again for leaks. None were found. The bridle sutures were removed. Two drops of Vigamox were placed in the eye, and the patient was discharged to the recovery room in good condition.    ____________________________ Loura Back Melba Araki, MD sad:ms D: 09/18/2013 10:40:34 ET T: 09/18/2013 18:41:04 ET JOB#: [id number]  cc: Remo Lipps A. Eudelia Hiltunen, MD, <Dictator> Martie Lee MD ELECTRONICALLY SIGNED 09/25/2013 13:35

## 2014-12-24 DIAGNOSIS — S52131A Displaced fracture of neck of right radius, initial encounter for closed fracture: Secondary | ICD-10-CM | POA: Diagnosis not present

## 2014-12-30 NOTE — Op Note (Signed)
PATIENT NAME:  Theresa Ferguson, Theresa Ferguson MR#:  [id number] DATE OF BIRTH:  08-08-1946  DATE OF PROCEDURE:  09/24/2014  PREOPERATIVE DIAGNOSIS: Nuclear sclerotic cataract, left eye.    POSTOPERATIVE DIAGNOSIS: Nuclear sclerotic cataract, left eye.    PROCEDURE PERFORMED: Extracapsular cataract extraction using phacoemulsification with placement of Alcon SN6CWS 15.5 diopter posterior chamber lens, serial number [id number].   ANESTHESIA: 4% lidocaine and 0.75% Marcaine in a 50/50 mixture with 10 units/mL of Hylenex added given as a peribulbar.   ANESTHESIOLOGIST: Dr. Ronelle Nigh.   COMPLICATIONS: None.   ESTIMATED BLOOD LOSS: Less than 1 mL.   DESCRIPTION OF PROCEDURE:  The patient was brought to the operating room and given a peribulbar block.  The patient was then prepped and draped in the usual fashion.  The vertical rectus muscles were imbricated using 5-0 silk sutures.  These sutures were then clamped to the sterile drapes as bridle sutures.  A limbal peritomy was performed extending two clock hours and hemostasis was obtained with cautery.  A partial thickness scleral groove was made at the surgical limbus and dissected anteriorly in a lamellar dissection using an Alcon crescent knife.  The anterior chamber was entered supero-temporally with a Superblade and through the lamellar dissection with a 2.6 mm keratome.  DisCoVisc was used to replace the aqueous and a continuous tear capsulorrhexis was carried out.  Hydrodissection and hydrodelineation were carried out with balanced salt and a 27 gauge canula.  The nucleus was rotated to confirm the effectiveness of the hydrodissection.  Phacoemulsification was carried out using a divide-and-conquer technique.  Total ultrasound time was 1 minute and 25 seconds with an average power of 24.8 percent. CDE of 39.02.    Irrigation/aspiration was used to remove the residual cortex.  DisCoVisc was used to inflate the capsule and the internal incision was enlarged to 3  mm with the crescent knife.  The intraocular lens was folded and inserted into the capsular bag using an AcrySert delivery system.  Irrigation/aspiration was used to remove the residual DisCoVisc.  Miostat was injected into the anterior chamber through the paracentesis track to inflate the anterior chamber and induce miosis.  0.1 mL of Vigamox containing 0.1 mg of drug was injected via the paracentesis tract. The wound was checked for leaks and none were found. The conjunctiva was closed with cautery and the bridle sutures were removed.  Two drops of 0.3% Vigamox were placed on the eye.   An eye shield was placed on the eye.  The patient was discharged to the recovery room in good condition.   ____________________________ Theresa Back Carel Carrier, MD sad:bu D: 09/24/2014 12:53:56 ET T: 09/24/2014 14:27:53 ET JOB#: [id number]  cc: Theresa Lipps A. Evalisse Prajapati, MD, <Dictator> Theresa Lee MD ELECTRONICALLY SIGNED 10/01/2014 11:42

## 2015-02-05 ENCOUNTER — Encounter: Payer: Self-pay | Admitting: Neurology

## 2015-02-05 ENCOUNTER — Ambulatory Visit (INDEPENDENT_AMBULATORY_CARE_PROVIDER_SITE_OTHER): Payer: Medicare Other | Admitting: Neurology

## 2015-02-05 VITALS — BP 118/60 | HR 80 | Resp 20 | Ht 64.96 in | Wt 163.0 lb

## 2015-02-05 DIAGNOSIS — D509 Iron deficiency anemia, unspecified: Secondary | ICD-10-CM

## 2015-02-05 DIAGNOSIS — G2581 Restless legs syndrome: Secondary | ICD-10-CM | POA: Diagnosis not present

## 2015-02-05 DIAGNOSIS — G629 Polyneuropathy, unspecified: Secondary | ICD-10-CM

## 2015-02-05 DIAGNOSIS — M5417 Radiculopathy, lumbosacral region: Secondary | ICD-10-CM | POA: Diagnosis not present

## 2015-02-05 DIAGNOSIS — E0849 Diabetes mellitus due to underlying condition with other diabetic neurological complication: Secondary | ICD-10-CM | POA: Diagnosis not present

## 2015-02-05 HISTORY — DX: Restless legs syndrome: G25.81

## 2015-02-05 MED ORDER — GABAPENTIN 800 MG PO TABS: 800.0000 mg | ORAL_TABLET | Freq: Four times a day (QID) | ORAL | Status: DC

## 2015-02-05 NOTE — Progress Notes (Signed)
[facility]  SLEEP MEDICINE CLINIC   Provider:  Dr Jasiyah Poland Referring Provider: Kelton Pillar, MD Primary Care Physician:  Osborne Casco, MD  Chief Complaint  Patient presents with  . Follow-up    neuropathy, rm 11, alone    HPI:  Theresa Ferguson is a 69 y.o. female here as a  Revisit .  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 I have seen the patient in 2004 for back pain and lumbal radiculopathy, and neeeded no follow up for a while  after a microdiscectomy at [facility] the same year . She has been seen again since 2012 in this office.   Meanwhile she had been diagnosed with diabetes in 2005 . An EMG and nerve conduction study in early 2012 showed mild to moderate bilateral median neuropathy across the wrist,  consistent with carpal tunnel syndrome. She also was prescribed gabapentin,and has continued to take it for relief of symptoms. Her dose has increased to 600 mg 4 times daily. At the time her HbA1c level was 6.2 ,  A MRI of her lower back was ordered, she was diagnosed with a bulging disc between L5 and S1 and referred neurosurgeon at [facility].  She underwent neurosurgery on January 29th 2014. Her back problems have improved  well since surgery . She has no new symptoms.  Her lower extremity neuropathy has not been affected by the surgery.  The bottom of her left foot is  numb since the surgery. 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  Interval history from 02-05-15, Theresa Ferguson reports that she again has symptoms of foot and leg pain that keep her from sleeping at night. She feels as if there is constantly some sandpaper between her toes but it has even manifested as vacuuming the floors at night. She also often rubs or feet or massages her legs and that might sometimes even keeps her husband up. This sounds like there  is to conditions present one is a polyneuropathy peripheral neuropathy in her case and another one is probably a component of restless legs. Her husband suffers from restless legs as well. She reports that when she sits for a while without moving she feels uncomfortably stiff and has discomfort that there is discomfort with initiating movement. Her sleep takes place between 9 Pm and takes a while to go to sleep, usually sleeping after 11 Pm and gets up at 7.30,  spontaneously . She sleeps through alarms, but her dog will wake her.   If she naps during the day,  frequently her night time sleep was interrupted.       Review of Systems: Out of a complete 14 system review, the patient complains of only the following symptoms, and all other reviewed systems are negative.  numbness , pin and needle dysesthesias, tingling : in lower extremities /feet  Sand paper feeling. Some times has an irresistible urge to move.   History   Social History  . Marital Status: Married    Spouse Name: Theresa Ferguson  . Number of Children: 2  . Years of Education: College   Occupational History  . 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 Smoking status: Former Research scientist (life sciences)  . Smokeless tobacco: Never Used  . Alcohol Use: Yes     Comment:  socially  . Drug Use: No  . Sexual Activity: Not on file   Other Topics Concern  . Not on file   Social History Narrative   Patient is married Theresa Ferguson) and lives at home with her husband.   Patient has two adult children.   Patient is retired.   Patient has a college education.   Patient is right-handed.   Patient drinks two cups of coffee daily.    Family History  Problem Relation Age of Onset  . Diabetes Son     Past Medical History  Diagnosis Date  . Hypertension   . Hyperlipemia   . CTS (carpal tunnel syndrome)     bil, by EMG/[place]  . Neuropathy in diabetes 02/02/2013  . Radiculopathy of lumbosacral region  02/02/2013  . RLS (restless legs syndrome) 02/05/2015    Past Surgical History  Procedure Laterality Date  . Kidney stones  1980  . Lumbar disc surgery    . Laminectomy      l4-5  . Knee surgery Bilateral     partial right-2008,left full replacement-2006-Dr Hooten,[facility]  . Finger surgery Right 1998    Hooten    Current Outpatient Prescriptions  Medication Sig Dispense Refill  . aspirin 81 MG tablet Take 81 mg by mouth daily.    . celecoxib (CELEBREX) 200 MG capsule Take 200 mg by mouth daily.    Marland Kitchen escitalopram (LEXAPRO) 10 MG tablet Take 10 mg by mouth daily.    . fluticasone (FLONASE) 50 MCG/ACT nasal spray     . gabapentin (NEURONTIN) 800 MG tablet Take 1 tablet (800 mg total) by mouth QID. 360 tablet 3  . Liraglutide 18 MG/3ML SOPN Inject 0.2 mLs into the skin.    Marland Kitchen lisinopril-hydrochlorothiazide (PRINZIDE,ZESTORETIC) 20-12.5 MG per tablet Take 1 tablet by mouth daily.    . metFORMIN (GLUCOPHAGE) 500 MG tablet Take 500 mg by mouth 2 (two) times daily. At bedtime    . Multiple Vitamin (MULTIVITAMIN) capsule Take 1 capsule by mouth daily.    . pantoprazole (PROTONIX) 40 MG tablet Take 40 mg by mouth daily.    . rosuvastatin (CRESTOR) 10 MG tablet Take 10 mg by mouth daily.     No current facility-administered medications for this visit.    Allergies as of 02/05/2015 - Review Complete 02/05/2015  Allergen Reaction Noted  . Codeine  02/02/2013  . Latex  02/02/2013  . Morphine and related  02/02/2013  . Nickel  02/02/2013  . Penicillins  02/02/2013    Vitals: BP 118/60 mmHg  Pulse 80  Resp 20  Ht 5' 4.96" (1.65 m)  Wt 163 lb (73.936 kg)  BMI 27.16 kg/m2 Last Weight:  Wt Readings from Last 1 Encounters:  02/05/15 163 lb (73.936 kg)   Last Height:   Ht Readings from Last 1 Encounters:  02/05/15 5' 4.96" (1.65 m)   Vision Screening:  See vitals  Physical exam:  General: The patient is awake, alert and appears not in acute distress. The patient is well  groomed. Head: Normocephalic, atraumatic. Neck is supple. Mallampati 2 , 16 inches neck.   Cardiovascular:  Regular rate and rhythm, without murmurs or carotid bruit, and without distended neck veins. Respiratory: Lungs are clear to auscultation. Skin:  Without evidence of edema, or rash Trunk: BMI is elevated and patient  has normal posture.  Neurologic exam : The patient is awake and alert, oriented to place and time.   Memory subjective  described as intact. There is a normal attention  span & concentration ability.  Speech is fluent without  dysarthria, dysphonia or aphasia. Mood and affect are appropriate.  Cranial nerves: Pupils are equal and briskly reactive to light. Funduscopic exam without  evidence of pallor or edema. Extraocular movements  in vertical and horizontal planes intact and without nystagmus. Visual fields by finger perimetry are intact.Hearing to finger rub intact.  Facial sensation intact to fine touch. Facial motor strength is symmetric and tongue and uvula move midline. Motor exam:  Normal tone and normal muscle bulk and symmetric normal strength in all extremities. She had initially not provided full strength with left foot dorsi-flexion, but repeat testing was normal.  Good bilateral grip strength.  Sensory:  Fine touch, pinprick and vibration were tested in all extremities. Feet are numb at the toes, the left foot's lateral aspect  and plantar area is numb ( S1 ) . Coordination: Rapid alternating movements in the fingers/hands is tested and normal. Finger-to-nose maneuver tested and normal without evidence of ataxia, dysmetria or tremor. Gait and station: Patient walks without assistive device-Strength within normal limits. Stance is stable and normal.  Tandem gait is un fragmented. Romberg testing is normal. Deep tendon reflexes: in the upper and lower extremities are symmetric and intact. Babinski maneuver response is down going.  Assessment:  After physical and  neurologic examination, review of laboratory studies, imaging, neurophysiology testing and pre-existing records, assessment will be reviewed on the problem list.   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 Interestingly the patient presented already 10 years ago for some peripheral neuropathy signs , at that time recovered fully -until she had her right knee replacement surgery . RLS questionnaire  endorsed mild impairment . GDS zero, Epworth 4 and FSS 23.   Continued treatment with Neurontin-Gabapentin. I will obtain Ferritin, TIBC and Sed rate. i have asked her to send her husband my way for RLS study.   Plan:  Treatment plan and additional workup :  Refills for gabapentin. Encouraged to continue B complex.  RV in 12 month .

## 2015-02-05 NOTE — Addendum Note (Signed)
Addended by: Larey Seat on: 02/05/2015 02:13 PM   Modules accepted: Orders

## 2015-02-06 LAB — IRON AND TIBC
Iron Saturation: 22 % (ref 15–55)
Iron: 71 ug/dL (ref 27–139)
Total Iron Binding Capacity: 322 ug/dL (ref 250–450)
UIBC: 251 ug/dL (ref 118–369)

## 2015-02-06 LAB — FERRITIN: Ferritin: 93 ng/mL (ref 15–150)

## 2015-02-11 ENCOUNTER — Telehealth: Payer: Self-pay

## 2015-02-11 NOTE — Telephone Encounter (Signed)
-----   Message from Larey Seat, MD sent at 02/07/2015  5:05 PM EDT ----- Erasmo Downer, Normal iron and ferritin level. Not to be enrolled in RLS study with IV iron use. Please keep a list of patients that were in or out.

## 2015-02-11 NOTE — Telephone Encounter (Signed)
Patient called back. I relayed to her the lab work was normal.

## 2015-02-11 NOTE — Telephone Encounter (Signed)
Called pt to tell her that her labs were normal. No answer. Left a message asking her to call back.

## 2015-02-12 DIAGNOSIS — Z0000 Encounter for general adult medical examination without abnormal findings: Secondary | ICD-10-CM | POA: Diagnosis not present

## 2015-02-12 DIAGNOSIS — F39 Unspecified mood [affective] disorder: Secondary | ICD-10-CM | POA: Diagnosis not present

## 2015-02-12 DIAGNOSIS — K219 Gastro-esophageal reflux disease without esophagitis: Secondary | ICD-10-CM | POA: Diagnosis not present

## 2015-02-12 DIAGNOSIS — Z1389 Encounter for screening for other disorder: Secondary | ICD-10-CM | POA: Diagnosis not present

## 2015-02-12 DIAGNOSIS — E1149 Type 2 diabetes mellitus with other diabetic neurological complication: Secondary | ICD-10-CM | POA: Diagnosis not present

## 2015-02-12 DIAGNOSIS — N182 Chronic kidney disease, stage 2 (mild): Secondary | ICD-10-CM | POA: Diagnosis not present

## 2015-02-12 DIAGNOSIS — M129 Arthropathy, unspecified: Secondary | ICD-10-CM | POA: Diagnosis not present

## 2015-02-12 DIAGNOSIS — J309 Allergic rhinitis, unspecified: Secondary | ICD-10-CM | POA: Diagnosis not present

## 2015-02-12 DIAGNOSIS — I129 Hypertensive chronic kidney disease with stage 1 through stage 4 chronic kidney disease, or unspecified chronic kidney disease: Secondary | ICD-10-CM | POA: Diagnosis not present

## 2015-02-12 DIAGNOSIS — E782 Mixed hyperlipidemia: Secondary | ICD-10-CM | POA: Diagnosis not present

## 2015-02-12 DIAGNOSIS — E1121 Type 2 diabetes mellitus with diabetic nephropathy: Secondary | ICD-10-CM | POA: Diagnosis not present

## 2015-02-12 DIAGNOSIS — Z794 Long term (current) use of insulin: Secondary | ICD-10-CM | POA: Diagnosis not present

## 2015-03-20 DIAGNOSIS — N760 Acute vaginitis: Secondary | ICD-10-CM | POA: Diagnosis not present

## 2015-03-20 DIAGNOSIS — Z124 Encounter for screening for malignant neoplasm of cervix: Secondary | ICD-10-CM | POA: Diagnosis not present

## 2015-03-20 DIAGNOSIS — N950 Postmenopausal bleeding: Secondary | ICD-10-CM | POA: Diagnosis not present

## 2015-03-20 DIAGNOSIS — Z01419 Encounter for gynecological examination (general) (routine) without abnormal findings: Secondary | ICD-10-CM | POA: Diagnosis not present

## 2015-03-20 DIAGNOSIS — N930 Postcoital and contact bleeding: Secondary | ICD-10-CM | POA: Diagnosis not present

## 2015-05-13 ENCOUNTER — Encounter: Payer: Self-pay | Admitting: Neurology

## 2015-05-13 ENCOUNTER — Ambulatory Visit (INDEPENDENT_AMBULATORY_CARE_PROVIDER_SITE_OTHER): Payer: Medicare Other | Admitting: Neurology

## 2015-05-13 VITALS — BP 146/82 | HR 78 | Resp 20 | Ht 64.0 in | Wt 165.0 lb

## 2015-05-13 DIAGNOSIS — E0849 Diabetes mellitus due to underlying condition with other diabetic neurological complication: Secondary | ICD-10-CM | POA: Diagnosis not present

## 2015-05-13 DIAGNOSIS — J302 Other seasonal allergic rhinitis: Secondary | ICD-10-CM | POA: Diagnosis not present

## 2015-05-13 DIAGNOSIS — M5417 Radiculopathy, lumbosacral region: Secondary | ICD-10-CM

## 2015-05-13 DIAGNOSIS — E0842 Diabetes mellitus due to underlying condition with diabetic polyneuropathy: Secondary | ICD-10-CM | POA: Insufficient documentation

## 2015-05-13 DIAGNOSIS — G2581 Restless legs syndrome: Secondary | ICD-10-CM | POA: Diagnosis not present

## 2015-05-13 MED ORDER — GABAPENTIN 800 MG PO TABS: 800.0000 mg | ORAL_TABLET | Freq: Four times a day (QID) | ORAL | Status: DC

## 2015-05-13 NOTE — Progress Notes (Signed)
[facility]  SLEEP MEDICINE CLINIC   Provider:  Dr Zarin Knupp Referring Provider: Kelton Pillar, MD Primary Care Physician:  Osborne Casco, MD  Chief Complaint  Patient presents with  . Follow-up    RLS, rm 11, alone    HPI:  Theresa Ferguson is a 69 y.o. female here as a  Revisit .  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 I have seen the patient in 2004 for back pain and lumbal radiculopathy, and she neeeded no follow up for a while  after a microdiscectomy at [place] the same year . She has been seen again since 2012 in this office.   Meanwhile she had been diagnosed with diabetes in 2005 . An EMG and nerve conduction study in early 2012 showed mild to moderate bilateral median neuropathy across the wrist,  consistent with carpal tunnel syndrome. She also was prescribed gabapentin,and has continued to take it for relief of symptoms. Her dose has increased to 600 mg 4 times daily. At the time her HbA1c level was 6.2 ,  A MRI of her lower back was ordered, she was diagnosed with a bulging disc between L5 and S1 and referred neurosurgeon at [facility].  She underwent another neurosurgery on January 29th 2014. Her back problems have improved  well since surgery . She has no new symptoms.  Her lower extremity neuropathy has not been affected by the surgery. The bottom of her left foot is  numb since the surgery. 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  Interval history from 02-05-15, Theresa Ferguson reports that she again has symptoms of foot and leg pain that keep her from sleeping at night. She feels as if there is constantly some sandpaper between her toes but it has even manifested as vacuuming the floors at night. She also often rubs or feet or massages her legs and that might sometimes even keeps her husband up. This sounds like  there is to conditions present one is a polyneuropathy peripheral neuropathy in her case and another one is probably a component of restless legs. Her husband suffers from restless legs as well. She reports that when she sits for a while without moving she feels uncomfortably stiff and has discomfort that there is discomfort with initiating movement. Her sleep takes place between 9 Pm and takes a while to go to sleep, usually sleeping after 11 Pm and gets up at 7.30,  spontaneously . She sleeps through alarms, but her dog will wake her.   If she naps during the day,  frequently her night time sleep was interrupted.  05-13-15 Theresa Ferguson is here today to follow-up on her restless leg condition, she underwent iron and ferritin level studies on 07 February 2015. Ferritin was 93 so not critically low her total iron binding capacity was 322, iron saturation was 22% and iron content was 71 mcg/dL. She also endorsed today the [facility] restless leg questionnaire which is a quality of life assessment. She states that she has a mild impairment if at all. She's had satisfied with her sleep during the last 7 days. Me today refills for her medications. She described a new problem, a muscle spasm- tightness in the right lower leg, anterior-lateral shin.       Review of Systems: Out of a complete 14 system review, the patient complains of only the following symptoms,  and all other reviewed systems are negative.  numbness , pin and needle dysesthesias, tingling :  in lower extremities /feet  Sand paper feeling. Some times has an irresistible urge to move, but much better on medication..   Social History   Social History  . Marital Status: Married    Spouse Name: Jeneen Rinks  . Number of Children: 2  . Years of Education: College   Occupational History  . 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 Smoking status: Former Research scientist (life sciences)  . Smokeless tobacco: Never  Used  . Alcohol Use: Yes     Comment: socially  . Drug Use: No  . Sexual Activity: Not on file   Other Topics Concern  . Not on file   Social History Narrative   Patient is married Jeneen Rinks) and lives at home with her husband.   Patient has two adult children.   Patient is retired.   Patient has a college education.   Patient is right-handed.   Patient drinks two cups of coffee daily.    Family History  Problem Relation Age of Onset  . Diabetes Son     Past Medical History  Diagnosis Date  . Hypertension   . Hyperlipemia   . CTS (carpal tunnel syndrome)     bil, by EMG/[place]  . Neuropathy in diabetes 02/02/2013  . Radiculopathy of lumbosacral region 02/02/2013  . RLS (restless legs syndrome) 02/05/2015    Past Surgical History  Procedure Laterality Date  . Kidney stones  1980  . Lumbar disc surgery    . Laminectomy      l4-5  . Knee surgery Bilateral     partial right-2008,left full replacement-2006-Dr Hooten,[facility]  . Finger surgery Right 1998    Hooten    Current Outpatient Prescriptions  Medication Sig Dispense Refill  . aspirin 81 MG tablet Take 81 mg by mouth daily.    . celecoxib (CELEBREX) 200 MG capsule Take 200 mg by mouth daily.    Marland Kitchen escitalopram (LEXAPRO) 10 MG tablet Take 10 mg by mouth daily.    . fluticasone (FLONASE) 50 MCG/ACT nasal spray     . gabapentin (NEURONTIN) 800 MG tablet Take 1 tablet (800 mg total) by mouth QID. 360 tablet 3  . Liraglutide 18 MG/3ML SOPN Inject 0.2 mLs into the skin.    Marland Kitchen lisinopril-hydrochlorothiazide (PRINZIDE,ZESTORETIC) 20-12.5 MG per tablet Take 1 tablet by mouth daily.    . metFORMIN (GLUCOPHAGE) 500 MG tablet Take 500 mg by mouth 2 (two) times daily. At bedtime    . Multiple Vitamin (MULTIVITAMIN) capsule Take 1 capsule by mouth daily.    . pantoprazole (PROTONIX) 40 MG tablet Take 40 mg by mouth daily.    . rosuvastatin (CRESTOR) 10 MG tablet Take 10 mg by mouth daily.     No current facility-administered  medications for this visit.    Allergies as of 05/13/2015 - Review Complete 05/13/2015  Allergen Reaction Noted  . Codeine  02/02/2013  . Latex  02/02/2013  . Morphine and related  02/02/2013  . Nickel  02/02/2013  . Penicillins  02/02/2013    Vitals: BP 146/82 mmHg  Pulse 78  Resp 20  Ht 5\' 4"  (1.626 m)  Wt 165 lb (74.844 kg)  BMI 28.31 kg/m2 Last Weight:  Wt Readings from Last 1 Encounters:  05/13/15 165 lb (74.844 kg)   Last Height:   Ht Readings from Last  1 Encounters:  05/13/15 5\' 4"  (1.626 m)   Vision Screening:  See vitals  Physical exam:  General: The patient is awake, alert and appears not in acute distress. The patient is well groomed. Head: Normocephalic, atraumatic. Neck is supple. Mallampati 2 , 16 inches neck.   Cardiovascular:  Regular rate and rhythm, without murmurs or carotid bruit, and without distended neck veins. Respiratory: Lungs are clear to auscultation. Skin:  Without evidence of edema, or rash Trunk: The patient  has normal posture.  Neurologic exam : The patient is awake and alert, oriented to place and time.   Memory subjective  described as intact. There is a normal attention span & concentration ability.  Speech is fluent without  dysarthria, dysphonia or aphasia. Mood and affect are appropriate.  Cranial nerves: Pupils are equal and briskly reactive to light. Funduscopic exam without  evidence of pallor or edema.  Extraocular movements  in vertical and horizontal planes intact and without nystagmus. Visual fields by finger perimetry are intact.Hearing to finger rub intact.  Facial sensation intact to fine touch. Facial motor strength is symmetric and tongue and uvula move midline. Motor exam:  Normal tone and normal muscle bulk and symmetric normal strength in all extremities.  She had initially not provided full strength with left foot dorsi-flexion, but repeat testing was normal. No foot drop.  Good bilateral grip strength. Equal  shoulder shrug. Sensory:  Fine touch, pinprick and vibration were tested in all extremities. Feet are numb at the toes, the left foot's lateral aspect and plantar area is numb ( S1 ) .lateral shin right leg "tension feeling" . Coordination: Rapid alternating movements in the fingers/hands is tested and normal. Finger-to-nose maneuver tested and normal without evidence of ataxia, dysmetria or tremor. Gait and station: Patient walks without assistive device-Strength within normal limits. Stance is stable and normal.  Tandem gait is un fragmented. Romberg testing is normal. Deep tendon reflexes: in the upper and lower extremities are symmetric and intact. Babinski maneuver response is down going.  Assessment:  This is a  20 minute RV with more than 50% of the face to face time dedicated to discussion of the diagnosis, continued therapies and side effects.  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 Interestingly the patient presented already 10 years ago for some peripheral neuropathy signs , at that time recovered fully -until she had her right knee replacement surgery . RLS questionnaire  endorsed mild impairment . GDS 1 , Epworth 5 and FSS 26.   Continued treatment with Neurontin-Gabapentin. I have asked her to send her husband my way for RLS study ( she described his RLS symptoms as disturbing her sleep, Dr Erling Cruz ordered a compound medication and magnesium supplement ) .   Plan:  Treatment plan and additional workup :  Refills for gabapentin in the treatment of neuropathy and restless legs. Iron studies were compared today the patient has low normal iron. Encouraged to continue B complex.  Rhinitis Ms. irritated mucous lining. I recommend when necessary use of Nasacort or Nasonex. RV in 12 month with me , Dr.  Brett Fairy.  Cc: Dr. Kelton Pillar , MD

## 2015-05-29 DIAGNOSIS — Z961 Presence of intraocular lens: Secondary | ICD-10-CM | POA: Diagnosis not present

## 2015-06-07 DIAGNOSIS — Z23 Encounter for immunization: Secondary | ICD-10-CM | POA: Diagnosis not present

## 2015-08-14 DIAGNOSIS — I129 Hypertensive chronic kidney disease with stage 1 through stage 4 chronic kidney disease, or unspecified chronic kidney disease: Secondary | ICD-10-CM | POA: Diagnosis not present

## 2015-08-14 DIAGNOSIS — N182 Chronic kidney disease, stage 2 (mild): Secondary | ICD-10-CM | POA: Diagnosis not present

## 2015-08-14 DIAGNOSIS — Z7984 Long term (current) use of oral hypoglycemic drugs: Secondary | ICD-10-CM | POA: Diagnosis not present

## 2015-08-14 DIAGNOSIS — E782 Mixed hyperlipidemia: Secondary | ICD-10-CM | POA: Diagnosis not present

## 2015-08-14 DIAGNOSIS — E1121 Type 2 diabetes mellitus with diabetic nephropathy: Secondary | ICD-10-CM | POA: Diagnosis not present

## 2016-02-04 DIAGNOSIS — M1612 Unilateral primary osteoarthritis, left hip: Secondary | ICD-10-CM | POA: Diagnosis not present

## 2016-02-04 DIAGNOSIS — M47816 Spondylosis without myelopathy or radiculopathy, lumbar region: Secondary | ICD-10-CM | POA: Diagnosis not present

## 2016-02-20 ENCOUNTER — Other Ambulatory Visit: Payer: Self-pay | Admitting: Orthopedic Surgery

## 2016-02-20 DIAGNOSIS — M545 Low back pain: Secondary | ICD-10-CM

## 2016-02-28 ENCOUNTER — Ambulatory Visit
Admission: RE | Admit: 2016-02-28 | Discharge: 2016-02-28 | Disposition: A | Discharge status: Home / Self Care | Payer: Medicare Other | Source: Ambulatory Visit | Attending: Orthopedic Surgery | Admitting: Orthopedic Surgery

## 2016-02-28 DIAGNOSIS — M4806 Spinal stenosis, lumbar region: Secondary | ICD-10-CM | POA: Diagnosis not present

## 2016-02-28 DIAGNOSIS — M545 Low back pain: Secondary | ICD-10-CM

## 2016-03-24 DIAGNOSIS — M25552 Pain in left hip: Secondary | ICD-10-CM | POA: Diagnosis not present

## 2016-03-24 DIAGNOSIS — M1612 Unilateral primary osteoarthritis, left hip: Secondary | ICD-10-CM | POA: Diagnosis not present

## 2016-03-24 DIAGNOSIS — M47816 Spondylosis without myelopathy or radiculopathy, lumbar region: Secondary | ICD-10-CM | POA: Diagnosis not present

## 2016-03-26 DIAGNOSIS — M1612 Unilateral primary osteoarthritis, left hip: Secondary | ICD-10-CM | POA: Diagnosis not present

## 2016-03-26 DIAGNOSIS — M25552 Pain in left hip: Secondary | ICD-10-CM | POA: Diagnosis not present

## 2016-04-09 DIAGNOSIS — M25552 Pain in left hip: Secondary | ICD-10-CM | POA: Diagnosis not present

## 2016-04-17 DIAGNOSIS — E782 Mixed hyperlipidemia: Secondary | ICD-10-CM | POA: Diagnosis not present

## 2016-04-17 DIAGNOSIS — N182 Chronic kidney disease, stage 2 (mild): Secondary | ICD-10-CM | POA: Diagnosis not present

## 2016-04-17 DIAGNOSIS — F39 Unspecified mood [affective] disorder: Secondary | ICD-10-CM | POA: Diagnosis not present

## 2016-04-17 DIAGNOSIS — M129 Arthropathy, unspecified: Secondary | ICD-10-CM | POA: Diagnosis not present

## 2016-04-17 DIAGNOSIS — Z7984 Long term (current) use of oral hypoglycemic drugs: Secondary | ICD-10-CM | POA: Diagnosis not present

## 2016-04-17 DIAGNOSIS — K219 Gastro-esophageal reflux disease without esophagitis: Secondary | ICD-10-CM | POA: Diagnosis not present

## 2016-04-17 DIAGNOSIS — Z0000 Encounter for general adult medical examination without abnormal findings: Secondary | ICD-10-CM | POA: Diagnosis not present

## 2016-04-17 DIAGNOSIS — E1149 Type 2 diabetes mellitus with other diabetic neurological complication: Secondary | ICD-10-CM | POA: Diagnosis not present

## 2016-04-17 DIAGNOSIS — J309 Allergic rhinitis, unspecified: Secondary | ICD-10-CM | POA: Diagnosis not present

## 2016-04-17 DIAGNOSIS — I129 Hypertensive chronic kidney disease with stage 1 through stage 4 chronic kidney disease, or unspecified chronic kidney disease: Secondary | ICD-10-CM | POA: Diagnosis not present

## 2016-04-17 DIAGNOSIS — E1121 Type 2 diabetes mellitus with diabetic nephropathy: Secondary | ICD-10-CM | POA: Diagnosis not present

## 2016-05-12 ENCOUNTER — Ambulatory Visit: Payer: Medicare Other | Admitting: Neurology

## 2016-05-12 DIAGNOSIS — Z124 Encounter for screening for malignant neoplasm of cervix: Secondary | ICD-10-CM | POA: Diagnosis not present

## 2016-05-12 DIAGNOSIS — Z1231 Encounter for screening mammogram for malignant neoplasm of breast: Secondary | ICD-10-CM | POA: Diagnosis not present

## 2016-05-12 DIAGNOSIS — Z01419 Encounter for gynecological examination (general) (routine) without abnormal findings: Secondary | ICD-10-CM | POA: Diagnosis not present

## 2016-05-14 ENCOUNTER — Other Ambulatory Visit: Payer: Self-pay | Admitting: Neurology

## 2016-05-14 DIAGNOSIS — E0849 Diabetes mellitus due to underlying condition with other diabetic neurological complication: Secondary | ICD-10-CM

## 2016-05-14 DIAGNOSIS — M5417 Radiculopathy, lumbosacral region: Secondary | ICD-10-CM

## 2016-05-19 ENCOUNTER — Ambulatory Visit (INDEPENDENT_AMBULATORY_CARE_PROVIDER_SITE_OTHER): Payer: Medicare Other | Admitting: Neurology

## 2016-05-19 ENCOUNTER — Encounter: Payer: Self-pay | Admitting: Neurology

## 2016-05-19 DIAGNOSIS — M5417 Radiculopathy, lumbosacral region: Secondary | ICD-10-CM | POA: Diagnosis not present

## 2016-05-19 DIAGNOSIS — E0849 Diabetes mellitus due to underlying condition with other diabetic neurological complication: Secondary | ICD-10-CM

## 2016-05-19 MED ORDER — GABAPENTIN 800 MG PO TABS: ORAL_TABLET | ORAL | 3 refills | Status: DC

## 2016-05-19 NOTE — Patient Instructions (Signed)
Microdiskectomy  Your spine is made up of bones called vertebrae. Oval-shaped disks filled with a thick liquid sit between the vertebrae. The disks function like cushions and keep the bones from rubbing together. However, an injury or normal aging can cause a disk to bulge out (herniated disk). The herniated disk can press on a nerve root, which is painful. The pain may be in the lower back, or it might shoot down a leg. There also may be tingling or numbness. One way to stop the pain is with a minimally invasive surgery called microdiskectomy. The part of the disk that sticks out from the spine is removed. Only a small surgical cut (incision) is made in the back. Microdiskectomy is easier on the muscles and other tissues than traditional surgery.  LET YOUR CAREGIVER KNOW ABOUT:  Any allergies.  The last time you had anything to eat or drink. This includes water, gum, and candy.  All medicines you are taking, including herbs, eyedrops, over-the-counter medicines and creams.  Blood thinners (anticoagulants), aspirin, or other drugs that could affect blood clotting.  Any history of blood clots.  Any history of bleeding or other blood problems.  Use of steroids (by mouth or as creams).  Previous problems with anesthesia.  Family history of anesthetic complications.  Possibility of pregnancy, if this applies.  Previous surgery.  Smoking history.  Any recent symptoms of colds or infections.  Other health problems. RISKS AND COMPLICATIONS   Leaking of spinal fluid. If this happens, you will need to stay in bed for a few days.  Bleeding.  Pain.  Infection near the incision.  Nerve damage.  Blood clot in a leg. The clot can move to the lungs. This can be very serious.  A herniated disk might come back (recur) and require additional surgery. BEFORE THE PROCEDURE   A medical evaluation will be done. This may include:  A physical exam.  Blood tests.  A test that checks  heart rhythm (electrocardiography).  Imaging tests such as a myelogram, chest X-ray, or MRI.  Ask your caregiver about changing or stopping your regular medicines.  You may need to stop using aspirin and nonsteroidal anti-inflammatory drugs (NSAIDs). This includes prescription drugs and over-the-counter drugs. If possible, do this 10 to 14 days before the procedure or as directed. You may also need to stop taking vitamin E.  If you take blood thinners, ask your caregiver when you should stop taking them.  You may need a stool-softening medicine a few days before the procedure.  Stop smoking 2 weeks before the procedure if you smoke. Smoking can slow down the healing process.  The day before the procedure, eat only a light dinner. Do not eat or drink anything for at least 8 hours before the procedure. Ask if it is okay to take any needed medicines with a sip of water.  You may talk with the person who will be in charge of the anesthetic medicine during the procedure.  Arrive at least 1 to 2 hours before the procedure or as directed. PROCEDURE  Small monitors will be put on your body. They are used to check your heart, blood pressure, and oxygen level.  An intravenous (IV) access tube will be inserted in your vein. Medicine will be able to flow directly into your body through the IV.  You might be given a medicine to help you relax (sedative).  You will be given a medicine that makes you sleep (general anesthetic).  Your back will  be cleaned with a special solution to kill germs on the skin.  Once you are asleep, the surgeon will make a small incision in the middle of your lower back. It is often just 1 to 2 inches long.  Muscles in the back are not cut. They are moved to the side.  Often, a small piece of bone needs to be removed. This creates a better "window" so the surgeon can see the herniated disk.  The surgeon uses a microscope to check the disk and nerves near it.  The  part of the disk that is causing problems is removed.  The area under the skin is closed with stitches. In time, these will go away on their own.  The skin is closed with small stitches (sutures) or staples.  A small bandage (dressing) is put over the incision.  The procedure usually takes about 1 hour. AFTER THE PROCEDURE   You will stay in a recovery area until the anesthesia has worn off. Your blood pressure and pulse will be checked every so often.  Some pain is normal after this surgery. You will probably be given pain medicine while in the recovery area.  You may be able to go home the same day as the surgery. Once you get up and start walking, you will be able to go home. Sometimes, an overnight stay is needed.   This information is not intended to replace advice given to you by your health care provider. Make sure you discuss any questions you have with your health care provider.   Document Released: 08/05/2009 Document Revised: 11/09/2011 Document Reviewed: 01/30/2015 Elsevier Interactive Patient Education [employer].

## 2016-05-19 NOTE — Progress Notes (Signed)
[facility]  SLEEP MEDICINE CLINIC   Provider:  Dr Marselino Slayton Referring Provider: Kelton Pillar, MD Primary Care Physician:  Osborne Casco, MD  Chief Complaint  Patient presents with  . Follow-up    having back issues    HPI:  Theresa Ferguson is a 70 y.o. female here as a  Revisit .  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 I have seen the patient in 2004 for back pain and lumbal radiculopathy, and she neeeded no follow up for a while  after a microdiscectomy at [place] the same year . She has been seen again since 2012 in this office.   Meanwhile she had been diagnosed with diabetes in 2005 . An EMG and nerve conduction study in early 2012 showed mild to moderate bilateral median neuropathy across the wrist,  consistent with carpal tunnel syndrome. She also was prescribed gabapentin,and has continued to take it for relief of symptoms. Her dose has increased to 600 mg 4 times daily. At the time her HbA1c level was 6.2 ,  A MRI of her lower back was ordered, she was diagnosed with a bulging disc between L5 and S1 and referred neurosurgeon at [facility].  She underwent another neurosurgery on January 29th 2014. Her back problems have improved  well since surgery . She has no new symptoms.  Her lower extremity neuropathy has not been affected by the surgery. The bottom of her left foot is  numb since the surgery. 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 Interval history from 05/19/2016. Theresa Ferguson, who has undergone 2 microdiscectomy with Dr. Pamala Hurry at [facility] is scheduled to see her neurosurgeon tomorrow ( Dr Lacinda Axon)  for a third intervention. She states that her back pain was aggravated after a journey to [place]. A journey she truly enjoyed that she was physically very active climbing on uneven ground etc. Most interesting  however, her feet bother her one bit during that trip. She used the same shoes that she would wear at home, she keeps active here in [place] as well. She is diabetic and is well controlled.   Interval history from 02-05-15, Theresa Ferguson reports that she again has symptoms of foot and leg pain that keep her from sleeping at night. She feels as if there is constantly some sandpaper between her toes but it has even manifested as vacuuming the floors at night. She also often rubs or feet or massages her legs and that might sometimes even keeps her husband up. This sounds like there is to conditions present one is a polyneuropathy peripheral neuropathy in her case and another one is probably a component of restless legs. Her husband suffers from restless legs as well. She reports that when she sits for a while without moving she feels uncomfortably stiff and has discomfort that there is discomfort with initiating movement. Her sleep takes place between 9 Pm and takes a while to go to sleep, usually sleeping after 11 Pm and gets up at 7.30,  spontaneously . She sleeps through alarms, but her dog will wake her.   If she naps during the day,  frequently her night time sleep was interrupted.  05-13-15 Theresa Ferguson is here today to follow-up on her restless leg condition, she underwent iron and ferritin level studies on 07 February 2015. Ferritin was 93 so not critically low her total iron  binding capacity was 322, iron saturation was 22% and iron content was 71 mcg/dL. She also endorsed today the [facility] restless leg questionnaire which is a quality of life assessment. She states that she has a mild impairment if at all. She's had satisfied with her sleep during the last 7 days. Me today refills for her medications. She described a new problem, a muscle spasm- tightness in the right lower leg, anterior-lateral shin.   Review of Systems: Out of a complete 14 system review, the patient complains of only the following  symptoms, and all other reviewed systems are negative. Numbness , pin and needle dysesthesias, tingling :  in lower extremities /feet  Sand paper feeling. Some times has an irresistible urge to move, but much better on medication..   Social History   Social History  . Marital status: Married    Spouse name: Jeneen Rinks  . Number of children: 2  . Years of education: College   Occupational History  . 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 Smoking status: Former Research scientist (life sciences)  . Smokeless tobacco: Never Used  . Alcohol use Yes     Comment: socially  . Drug use: No  . Sexual activity: Not on file   Other Topics Concern  . Not on file   Social History Narrative   Patient is married Jeneen Rinks) and lives at home with her husband.   Patient has two adult children.   Patient is retired.   Patient has a college education.   Patient is right-handed.   Patient drinks two cups of coffee daily.    Family History  Problem Relation Age of Onset  . Diabetes Son     Past Medical History:  Diagnosis Date  . CTS (carpal tunnel syndrome)    bil, by EMG/[place]  . Hyperlipemia   . Hypertension   . Neuropathy in diabetes ([place]) 02/02/2013  . Radiculopathy of lumbosacral region 02/02/2013  . RLS (restless legs syndrome) 02/05/2015    Past Surgical History:  Procedure Laterality Date  . FINGER SURGERY Right 1998   [facility]  . kidney stones  1980  . KNEE SURGERY Bilateral    partial right-2008,left full replacement-2006-Dr [facility]  . LAMINECTOMY     l4-5  . LUMBAR DISC SURGERY      Current Outpatient Prescriptions  Medication Sig Dispense Refill  . aspirin 81 MG tablet Take 81 mg by mouth daily.    . celecoxib (CELEBREX) 200 MG capsule Take 200 mg by mouth daily.    Marland Kitchen escitalopram (LEXAPRO) 10 MG tablet Take 10 mg by mouth daily.    . fluticasone (FLONASE) 50 MCG/ACT nasal spray     . gabapentin (NEURONTIN) 800 MG tablet TAKE ONE TABLET  FOUR TIMES A DAY 360 tablet 3  . Liraglutide 18 MG/3ML SOPN Inject 0.2 mLs into the skin.    Marland Kitchen lisinopril-hydrochlorothiazide (PRINZIDE,ZESTORETIC) 20-12.5 MG per tablet Take 1 tablet by mouth daily.    . metFORMIN (GLUCOPHAGE) 500 MG tablet Take 500 mg by mouth 2 (two) times daily. At bedtime    . Multiple Vitamin (MULTIVITAMIN) capsule Take 1 capsule by mouth daily.    . pantoprazole (PROTONIX) 40 MG tablet Take 40 mg by mouth daily.    . rosuvastatin (CRESTOR) 10 MG tablet Take 10 mg by mouth daily.     No current facility-administered medications for this visit.     Allergies as of  05/19/2016 - Review Complete 05/19/2016  Allergen Reaction Noted  . Codeine  02/02/2013  . Latex  02/02/2013  . Morphine and related  02/02/2013  . Nickel  02/02/2013  . Penicillins  02/02/2013    Vitals: BP 132/70   Pulse 64   Resp 20   Ht 5\' 5"  (1.651 m)   Wt 158 lb (71.7 kg)   BMI 26.29 kg/m  Last Weight:  Wt Readings from Last 1 Encounters:  05/19/16 158 lb (71.7 kg)   Last Height:   Ht Readings from Last 1 Encounters:  05/19/16 5\' 5"  (1.651 m)   Vision Screening:  See vitals  Physical exam:  General: The patient is awake, alert and appears not in acute distress. The patient is well groomed. Head: Normocephalic, atraumatic. Neck is supple. Mallampati 2 , 16 inches neck.   Cardiovascular:  Regular rate and rhythm, without murmurs or carotid bruit, and without distended neck veins. Respiratory: Lungs are clear to auscultation. Skin:  Without evidence of edema, or rash Trunk: The patient  has normal posture.  Neurologic exam : The patient is awake and alert, oriented to place and time.   Memory subjective  described as intact. There is a normal attention span & concentration ability.  Speech is fluent without  dysarthria, dysphonia or aphasia. Mood and affect are appropriate.  Cranial nerves: Pupils are equal and briskly reactive to light. Funduscopic exam without  evidence of  pallor or edema.  Extraocular movements  in vertical and horizontal planes intact and without nystagmus. Visual fields by finger perimetry are intact.Hearing to finger rub intact.  Facial sensation intact to fine touch. Facial motor strength is symmetric and tongue and uvula move midline. Motor exam:  Normal tone and  muscle bulk and symmetric normal strength in all extremities.  She had initially not provided full strength with left foot dorsi-flexion, but repeat testing was normal. No foot drop.  Good bilateral grip strength. Equal shoulder shrug. Sensory:  Fine touch, pinprick and vibration were tested in all extremities. Feet are numb at the toes, the left foot's lateral aspect and plantar area is numb ( S1 ) .lateral shin right leg "tension feeling" .  Coordination: Rapid alternating movements in the fingers/hands is tested and normal. Finger-to-nose maneuver tested and normal without evidence of ataxia, dysmetria or tremor. Gait and station: Patient walks without assistive device-Strength within normal limits. Stance is stable and normal.  Tandem gait is un fragmented. Romberg testing is normal. Deep tendon reflexes: in the upper and lower extremities are symmetric and intact.   Assessment:  This is a  20 minute RV with more than 50% of the face to face time dedicated to discussion of the diagnosis, continued therapies and side effects.  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 Interestingly the patient presented already 10 years ago for some peripheral neuropathy signs , at that time recovered fully -until she had her right knee replacement surgery . RLS questionnaire  endorsed mild impairment .  Continued treatment with Neurontin-Gabapentin. I have asked her to send her husband my way for RLS study ( she  described his RLS symptoms as disturbing her sleep, Dr Erling Cruz ordered a compound medication and magnesium supplement ) .   Plan:  Treatment plan and additional workup :  She will see Dr Pamala Hurry and Marsa Aris at [facility] neurosurgery.  Refills for gabapentin in the treatment of  neuropathy and restless legs. Iron studies were compared today the patient has low normal iron. Encouraged to continue B complex.  Rhinitis Ms. irritated mucous lining. I recommend when necessary use of Nasacort or Nasonex. RV in 12 month with me , Dr. Brett Fairy.  Cc: Dr. Kelton Pillar , MD

## 2016-05-20 DIAGNOSIS — M545 Low back pain: Secondary | ICD-10-CM | POA: Diagnosis not present

## 2016-05-26 DIAGNOSIS — Z23 Encounter for immunization: Secondary | ICD-10-CM | POA: Diagnosis not present

## 2016-06-10 DIAGNOSIS — M5440 Lumbago with sciatica, unspecified side: Secondary | ICD-10-CM | POA: Diagnosis not present

## 2016-06-10 DIAGNOSIS — M5136 Other intervertebral disc degeneration, lumbar region: Secondary | ICD-10-CM | POA: Diagnosis not present

## 2016-06-29 DIAGNOSIS — M25552 Pain in left hip: Secondary | ICD-10-CM | POA: Diagnosis not present

## 2016-10-19 DIAGNOSIS — M5136 Other intervertebral disc degeneration, lumbar region: Secondary | ICD-10-CM | POA: Diagnosis not present

## 2016-10-19 DIAGNOSIS — N182 Chronic kidney disease, stage 2 (mild): Secondary | ICD-10-CM | POA: Diagnosis not present

## 2016-10-19 DIAGNOSIS — E1121 Type 2 diabetes mellitus with diabetic nephropathy: Secondary | ICD-10-CM | POA: Diagnosis not present

## 2016-10-19 DIAGNOSIS — E782 Mixed hyperlipidemia: Secondary | ICD-10-CM | POA: Diagnosis not present

## 2016-10-19 DIAGNOSIS — I129 Hypertensive chronic kidney disease with stage 1 through stage 4 chronic kidney disease, or unspecified chronic kidney disease: Secondary | ICD-10-CM | POA: Diagnosis not present

## 2016-11-16 DIAGNOSIS — M25552 Pain in left hip: Secondary | ICD-10-CM | POA: Diagnosis not present

## 2016-11-24 DIAGNOSIS — M546 Pain in thoracic spine: Secondary | ICD-10-CM | POA: Diagnosis not present

## 2016-11-24 DIAGNOSIS — R0781 Pleurodynia: Secondary | ICD-10-CM | POA: Diagnosis not present

## 2016-12-07 ENCOUNTER — Other Ambulatory Visit: Payer: Self-pay | Admitting: Family Medicine

## 2016-12-07 DIAGNOSIS — I129 Hypertensive chronic kidney disease with stage 1 through stage 4 chronic kidney disease, or unspecified chronic kidney disease: Secondary | ICD-10-CM | POA: Diagnosis not present

## 2016-12-07 DIAGNOSIS — R109 Unspecified abdominal pain: Secondary | ICD-10-CM | POA: Diagnosis not present

## 2016-12-07 DIAGNOSIS — R1084 Generalized abdominal pain: Secondary | ICD-10-CM

## 2016-12-11 ENCOUNTER — Ambulatory Visit
Admission: RE | Admit: 2016-12-11 | Discharge: 2016-12-11 | Disposition: A | Discharge status: Home / Self Care | Payer: Medicare Other | Source: Ambulatory Visit | Attending: Family Medicine | Admitting: Family Medicine

## 2016-12-11 DIAGNOSIS — R1084 Generalized abdominal pain: Secondary | ICD-10-CM

## 2016-12-11 MED ORDER — IOPAMIDOL (ISOVUE-300) INJECTION 61%
100.0000 mL | Freq: Once | INTRAVENOUS | Status: AC | PRN
  Administered 2016-12-11: 100 mL via INTRAVENOUS

## 2016-12-23 DIAGNOSIS — E119 Type 2 diabetes mellitus without complications: Secondary | ICD-10-CM | POA: Diagnosis not present

## 2016-12-23 DIAGNOSIS — M7662 Achilles tendinitis, left leg: Secondary | ICD-10-CM | POA: Diagnosis not present

## 2016-12-23 DIAGNOSIS — M79672 Pain in left foot: Secondary | ICD-10-CM | POA: Diagnosis not present

## 2016-12-23 DIAGNOSIS — M7732 Calcaneal spur, left foot: Secondary | ICD-10-CM | POA: Diagnosis not present

## 2017-01-18 DIAGNOSIS — I129 Hypertensive chronic kidney disease with stage 1 through stage 4 chronic kidney disease, or unspecified chronic kidney disease: Secondary | ICD-10-CM | POA: Diagnosis not present

## 2017-01-18 DIAGNOSIS — N182 Chronic kidney disease, stage 2 (mild): Secondary | ICD-10-CM | POA: Diagnosis not present

## 2017-01-18 DIAGNOSIS — D509 Iron deficiency anemia, unspecified: Secondary | ICD-10-CM | POA: Diagnosis not present

## 2017-01-20 DIAGNOSIS — M7662 Achilles tendinitis, left leg: Secondary | ICD-10-CM | POA: Diagnosis not present

## 2017-01-21 DIAGNOSIS — D509 Iron deficiency anemia, unspecified: Secondary | ICD-10-CM | POA: Diagnosis not present

## 2017-01-28 DIAGNOSIS — M7662 Achilles tendinitis, left leg: Secondary | ICD-10-CM | POA: Diagnosis not present

## 2017-02-01 DIAGNOSIS — M7662 Achilles tendinitis, left leg: Secondary | ICD-10-CM | POA: Diagnosis not present

## 2017-02-04 DIAGNOSIS — M7662 Achilles tendinitis, left leg: Secondary | ICD-10-CM | POA: Diagnosis not present

## 2017-02-10 DIAGNOSIS — M7662 Achilles tendinitis, left leg: Secondary | ICD-10-CM | POA: Diagnosis not present

## 2017-02-15 DIAGNOSIS — M7662 Achilles tendinitis, left leg: Secondary | ICD-10-CM | POA: Diagnosis not present

## 2017-02-18 DIAGNOSIS — M7662 Achilles tendinitis, left leg: Secondary | ICD-10-CM | POA: Diagnosis not present

## 2017-02-22 DIAGNOSIS — M7662 Achilles tendinitis, left leg: Secondary | ICD-10-CM | POA: Diagnosis not present

## 2017-02-22 DIAGNOSIS — M7732 Calcaneal spur, left foot: Secondary | ICD-10-CM | POA: Diagnosis not present

## 2017-04-12 DIAGNOSIS — S82845A Nondisplaced bimalleolar fracture of left lower leg, initial encounter for closed fracture: Secondary | ICD-10-CM | POA: Diagnosis not present

## 2017-04-13 DIAGNOSIS — S82845A Nondisplaced bimalleolar fracture of left lower leg, initial encounter for closed fracture: Secondary | ICD-10-CM | POA: Diagnosis not present

## 2017-04-19 DIAGNOSIS — S82845A Nondisplaced bimalleolar fracture of left lower leg, initial encounter for closed fracture: Secondary | ICD-10-CM | POA: Diagnosis not present

## 2017-05-05 DIAGNOSIS — S82845D Nondisplaced bimalleolar fracture of left lower leg, subsequent encounter for closed fracture with routine healing: Secondary | ICD-10-CM | POA: Diagnosis not present

## 2017-05-19 ENCOUNTER — Ambulatory Visit: Payer: Medicare Other | Admitting: Adult Health

## 2017-05-26 DIAGNOSIS — S82845D Nondisplaced bimalleolar fracture of left lower leg, subsequent encounter for closed fracture with routine healing: Secondary | ICD-10-CM | POA: Diagnosis not present

## 2017-06-09 DIAGNOSIS — S82845D Nondisplaced bimalleolar fracture of left lower leg, subsequent encounter for closed fracture with routine healing: Secondary | ICD-10-CM | POA: Diagnosis not present

## 2017-06-14 ENCOUNTER — Encounter: Payer: Self-pay | Admitting: Adult Health

## 2017-06-14 ENCOUNTER — Ambulatory Visit (INDEPENDENT_AMBULATORY_CARE_PROVIDER_SITE_OTHER): Payer: Medicare Other | Admitting: Adult Health

## 2017-06-14 VITALS — BP 136/74 | HR 84 | Ht 65.0 in | Wt 162.0 lb

## 2017-06-14 DIAGNOSIS — G2581 Restless legs syndrome: Secondary | ICD-10-CM | POA: Diagnosis not present

## 2017-06-14 DIAGNOSIS — G629 Polyneuropathy, unspecified: Secondary | ICD-10-CM | POA: Diagnosis not present

## 2017-06-14 MED ORDER — GABAPENTIN 800 MG PO TABS: ORAL_TABLET | ORAL | 3 refills | Status: DC

## 2017-06-14 NOTE — Patient Instructions (Signed)
Your Plan:  Continue Gabapentin 800 mg in the morning and at lunch. Take 1600 mg at bedtime Use compound cream for breakthrough pain If your symptoms worsen or you develop new symptoms please let us know.    Thank you for coming to see [place] at [facility]. I hope we have been able to provide you high quality care today.  You may receive a patient satisfaction survey over the next few weeks. We would appreciate your feedback and comments so that we may continue to improve ourselves and the health of our patients.

## 2017-06-14 NOTE — Progress Notes (Signed)
PATIENT: JOSETTA WIGAL DOB: [birth year]  REASON FOR VISIT: follow up HISTORY FROM: patient  HISTORY OF PRESENT ILLNESS: Today 06/14/17 Ms. Guzzetta is a 71 year old female with a history of neuropathy and restless leg syndrome. She returns today for follow-up. She is currently taking gabapentin 800 mg in the morning and at noon and 600 mg in the evening. She reports that some weeks her pain is horrible and other weeks she does not even notice it. She states that she has numbness in the feet that extends to the ankle. She describes her discomfort as a burning tingling sensation in the feet. She describes it as if she is being stung by a bee. She states that she did break her left ankle and just recently got out of her boot. She states that she was coming downstairs and her left hip gave out and she fell. She denies any significant changes to her gait or balance due to her neuropathy. She does not use a cane or walker. She returns today for an evaluation.   REVIEW OF SYSTEMS: Out of a complete 14 system review of symptoms, the patient complains only of the following symptoms, and all other reviewed systems are negative.  Muscle cramps, runny nose  ALLERGIES: Allergies  Allergen Reactions  . Codeine   . Latex   . Morphine And Related   . Nickel   . Penicillins     HOME MEDICATIONS: Outpatient Medications Prior to Visit  Medication Sig Dispense Refill  . aspirin 81 MG tablet Take 81 mg by mouth daily.    . celecoxib (CELEBREX) 200 MG capsule Take 200 mg by mouth daily.    Marland Kitchen escitalopram (LEXAPRO) 10 MG tablet Take 10 mg by mouth daily.    . fluticasone (FLONASE) 50 MCG/ACT nasal spray     . gabapentin (NEURONTIN) 800 MG tablet Take one by mouth at morning . Lunch and 2 at night. 360 tablet 3  . lisinopril-hydrochlorothiazide (PRINZIDE,ZESTORETIC) 20-12.5 MG per tablet Take 1 tablet by mouth daily.    . metFORMIN (GLUCOPHAGE) 500 MG tablet Take 500 mg by mouth 2 (two) times daily. At  bedtime    . Multiple Vitamin (MULTIVITAMIN) capsule Take 1 capsule by mouth daily.    . pantoprazole (PROTONIX) 40 MG tablet Take 40 mg by mouth daily.    . rosuvastatin (CRESTOR) 10 MG tablet Take 10 mg by mouth daily.    . Liraglutide 18 MG/3ML SOPN Inject 0.2 mLs into the skin.     No facility-administered medications prior to visit.     PAST MEDICAL HISTORY: Past Medical History:  Diagnosis Date  . CTS (carpal tunnel syndrome)    bil, by EMG/[place]  . Hyperlipemia   . Hypertension   . Neuropathy in diabetes ([place]) 02/02/2013  . Radiculopathy of lumbosacral region 02/02/2013  . RLS (restless legs syndrome) 02/05/2015    PAST SURGICAL HISTORY: Past Surgical History:  Procedure Laterality Date  . FINGER SURGERY Right 1998   [facility]  . kidney stones  1980  . KNEE SURGERY Bilateral    partial right-2008,left full replacement-2006-Dr [facility]  . LAMINECTOMY     l4-5  . LUMBAR DISC SURGERY      FAMILY HISTORY: Family History  Problem Relation Age of Onset  . Diabetes Son     SOCIAL HISTORY: Social History   Social History  . Marital status: Married    Spouse name: Jeneen Rinks  . Number of children: 2  . Years of education: [employer]  Occupational History  . 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 Smoking status: Former Research scientist (life sciences)  . Smokeless tobacco: Never Used  . Alcohol use Yes     Comment: socially  . Drug use: No  . Sexual activity: Not on file   Other Topics Concern  . Not on file   Social History Narrative   Patient is married Jeneen Rinks) and lives at home with her husband.   Patient has two adult children.   Patient is retired.   Patient has a college education.   Patient is right-handed.   Patient drinks two cups of coffee daily.      PHYSICAL EXAM  Vitals:   06/14/17 0823  BP: 136/74  Pulse: 84  Weight: 162 lb (73.5 kg)  Height: 5\' 5"  (1.651 m)   Body mass index is 26.96  kg/m.  Generalized: Well developed, in no acute distress   Neurological examination  Mentation: Alert oriented to time, place, history taking. Follows all commands speech and language fluent Cranial nerve II-XII: Pupils were equal round reactive to light. Extraocular movements were full, visual field were full on confrontational test. Facial sensation and strength were normal. Uvula tongue midline. Head turning and shoulder shrug  were normal and symmetric. Motor: The motor testing reveals 5 over 5 strength of all 4 extremities. Good symmetric motor tone is noted throughout.  Sensory: Sensory testing is intact to soft touch on all 4 extremities. Pinprick sensation decreased in the extremities in a stocking like pattern. No evidence of extinction is noted.  Coordination: Cerebellar testing reveals good finger-nose-finger and heel-to-shin bilaterally.  Gait and station: Gait is normal. Tandem gait is normal. Romberg is negative. No drift is seen.  Reflexes: Deep tendon reflexes are symmetric and normal bilaterally.   DIAGNOSTIC DATA (LABS, IMAGING, TESTING) - I reviewed patient records, labs, notes, testing and imaging myself where available.     ASSESSMENT AND PLAN 71 y.o. year old female  has a past medical history of CTS (carpal tunnel syndrome); Hyperlipemia; Hypertension; Neuropathy in diabetes ([place]) (02/02/2013); Radiculopathy of lumbosacral region (02/02/2013); and RLS (restless legs syndrome) (02/05/2015). here with:  1. Restless leg syndrome 2. Neuropathy  The patient will continue on gabapentin 800 mg in the morning and at lunch and 600 mg at bedtime. I will give her a prescription for compound cream that she can use  for breakthrough pain. She is advised that if her symptoms worsen or she develops new symptoms she should let us know. She will follow-up in 6 months or sooner if needed.     Ward Givens, MSN, NP-C 06/14/2017, 8:38 AM [facility] [address],  [place], [place] [address] [phone]

## 2017-06-16 DIAGNOSIS — Z23 Encounter for immunization: Secondary | ICD-10-CM | POA: Diagnosis not present

## 2017-07-27 DIAGNOSIS — E1121 Type 2 diabetes mellitus with diabetic nephropathy: Secondary | ICD-10-CM | POA: Diagnosis not present

## 2017-07-27 DIAGNOSIS — J309 Allergic rhinitis, unspecified: Secondary | ICD-10-CM | POA: Diagnosis not present

## 2017-07-27 DIAGNOSIS — Z0000 Encounter for general adult medical examination without abnormal findings: Secondary | ICD-10-CM | POA: Diagnosis not present

## 2017-07-27 DIAGNOSIS — I129 Hypertensive chronic kidney disease with stage 1 through stage 4 chronic kidney disease, or unspecified chronic kidney disease: Secondary | ICD-10-CM | POA: Diagnosis not present

## 2017-07-27 DIAGNOSIS — Z1389 Encounter for screening for other disorder: Secondary | ICD-10-CM | POA: Diagnosis not present

## 2017-07-27 DIAGNOSIS — D509 Iron deficiency anemia, unspecified: Secondary | ICD-10-CM | POA: Diagnosis not present

## 2017-07-27 DIAGNOSIS — E1149 Type 2 diabetes mellitus with other diabetic neurological complication: Secondary | ICD-10-CM | POA: Diagnosis not present

## 2017-07-27 DIAGNOSIS — K219 Gastro-esophageal reflux disease without esophagitis: Secondary | ICD-10-CM | POA: Diagnosis not present

## 2017-07-27 DIAGNOSIS — M129 Arthropathy, unspecified: Secondary | ICD-10-CM | POA: Diagnosis not present

## 2017-07-27 DIAGNOSIS — F39 Unspecified mood [affective] disorder: Secondary | ICD-10-CM | POA: Diagnosis not present

## 2017-07-27 DIAGNOSIS — E782 Mixed hyperlipidemia: Secondary | ICD-10-CM | POA: Diagnosis not present

## 2017-07-27 DIAGNOSIS — N182 Chronic kidney disease, stage 2 (mild): Secondary | ICD-10-CM | POA: Diagnosis not present

## 2017-08-11 ENCOUNTER — Other Ambulatory Visit: Payer: Self-pay | Admitting: Neurology

## 2017-08-11 DIAGNOSIS — M5417 Radiculopathy, lumbosacral region: Secondary | ICD-10-CM

## 2017-08-11 DIAGNOSIS — E0849 Diabetes mellitus due to underlying condition with other diabetic neurological complication: Secondary | ICD-10-CM

## 2017-08-12 ENCOUNTER — Telehealth: Payer: Self-pay | Admitting: Adult Health

## 2017-08-12 ENCOUNTER — Other Ambulatory Visit: Payer: Self-pay | Admitting: Neurology

## 2017-08-12 DIAGNOSIS — G629 Polyneuropathy, unspecified: Secondary | ICD-10-CM

## 2017-08-12 DIAGNOSIS — G2581 Restless legs syndrome: Secondary | ICD-10-CM

## 2017-08-12 MED ORDER — GABAPENTIN 800 MG PO TABS: ORAL_TABLET | ORAL | 3 refills | Status: DC

## 2017-08-12 NOTE — Telephone Encounter (Signed)
Prescription was sent today

## 2017-08-12 NOTE — Telephone Encounter (Signed)
Nicole Kindred from [facility] calling stating she was told that a 1 year prescription was called in for pt's gabapentin (NEURONTIN) 800 MG tablet but the pharmacy never received it.  Nicole Kindred @[facility]  pharmacy is asking for a new prescription to be called in

## 2017-10-21 DIAGNOSIS — M25552 Pain in left hip: Secondary | ICD-10-CM | POA: Diagnosis not present

## 2017-10-21 DIAGNOSIS — M1612 Unilateral primary osteoarthritis, left hip: Secondary | ICD-10-CM | POA: Diagnosis not present

## 2017-12-27 DIAGNOSIS — H16223 Keratoconjunctivitis sicca, not specified as Sjogren's, bilateral: Secondary | ICD-10-CM | POA: Diagnosis not present

## 2017-12-27 DIAGNOSIS — E119 Type 2 diabetes mellitus without complications: Secondary | ICD-10-CM | POA: Diagnosis not present

## 2018-01-11 DIAGNOSIS — H16223 Keratoconjunctivitis sicca, not specified as Sjogren's, bilateral: Secondary | ICD-10-CM | POA: Diagnosis not present

## 2018-01-25 DIAGNOSIS — Z7984 Long term (current) use of oral hypoglycemic drugs: Secondary | ICD-10-CM | POA: Diagnosis not present

## 2018-01-25 DIAGNOSIS — E1121 Type 2 diabetes mellitus with diabetic nephropathy: Secondary | ICD-10-CM | POA: Diagnosis not present

## 2018-01-25 DIAGNOSIS — E119 Type 2 diabetes mellitus without complications: Secondary | ICD-10-CM | POA: Diagnosis not present

## 2018-01-25 DIAGNOSIS — I129 Hypertensive chronic kidney disease with stage 1 through stage 4 chronic kidney disease, or unspecified chronic kidney disease: Secondary | ICD-10-CM | POA: Diagnosis not present

## 2018-01-25 DIAGNOSIS — E1149 Type 2 diabetes mellitus with other diabetic neurological complication: Secondary | ICD-10-CM | POA: Diagnosis not present

## 2018-01-25 DIAGNOSIS — E782 Mixed hyperlipidemia: Secondary | ICD-10-CM | POA: Diagnosis not present

## 2018-01-25 DIAGNOSIS — N182 Chronic kidney disease, stage 2 (mild): Secondary | ICD-10-CM | POA: Diagnosis not present

## 2018-01-25 DIAGNOSIS — R300 Dysuria: Secondary | ICD-10-CM | POA: Diagnosis not present

## 2018-02-11 DIAGNOSIS — Z1231 Encounter for screening mammogram for malignant neoplasm of breast: Secondary | ICD-10-CM | POA: Diagnosis not present

## 2018-02-11 DIAGNOSIS — Z01419 Encounter for gynecological examination (general) (routine) without abnormal findings: Secondary | ICD-10-CM | POA: Diagnosis not present

## 2018-02-11 DIAGNOSIS — Z124 Encounter for screening for malignant neoplasm of cervix: Secondary | ICD-10-CM | POA: Diagnosis not present

## 2018-03-15 DIAGNOSIS — D509 Iron deficiency anemia, unspecified: Secondary | ICD-10-CM | POA: Diagnosis not present

## 2018-03-15 DIAGNOSIS — Z1211 Encounter for screening for malignant neoplasm of colon: Secondary | ICD-10-CM | POA: Diagnosis not present

## 2018-03-15 DIAGNOSIS — Z86010 Personal history of colonic polyps: Secondary | ICD-10-CM | POA: Diagnosis not present

## 2018-03-18 DIAGNOSIS — D509 Iron deficiency anemia, unspecified: Secondary | ICD-10-CM | POA: Diagnosis not present

## 2018-04-22 DIAGNOSIS — Z1211 Encounter for screening for malignant neoplasm of colon: Secondary | ICD-10-CM | POA: Diagnosis not present

## 2018-04-22 DIAGNOSIS — Z86010 Personal history of colonic polyps: Secondary | ICD-10-CM | POA: Diagnosis not present

## 2018-05-29 DIAGNOSIS — M7989 Other specified soft tissue disorders: Secondary | ICD-10-CM | POA: Diagnosis not present

## 2018-05-29 DIAGNOSIS — M79674 Pain in right toe(s): Secondary | ICD-10-CM | POA: Diagnosis not present

## 2018-06-14 ENCOUNTER — Ambulatory Visit (INDEPENDENT_AMBULATORY_CARE_PROVIDER_SITE_OTHER): Payer: Medicare Other | Admitting: Adult Health

## 2018-06-14 ENCOUNTER — Encounter: Payer: Self-pay | Admitting: Adult Health

## 2018-06-14 VITALS — BP 161/78 | HR 70 | Ht 65.0 in | Wt 163.4 lb

## 2018-06-14 DIAGNOSIS — G2581 Restless legs syndrome: Secondary | ICD-10-CM

## 2018-06-14 DIAGNOSIS — G629 Polyneuropathy, unspecified: Secondary | ICD-10-CM | POA: Diagnosis not present

## 2018-06-14 MED ORDER — NONFORMULARY OR COMPOUNDED ITEM: 5 refills | Status: DC

## 2018-06-14 MED ORDER — GABAPENTIN 800 MG PO TABS: ORAL_TABLET | ORAL | 3 refills | Status: DC

## 2018-06-14 NOTE — Patient Instructions (Signed)
Your Plan:  Continue Gabapentin  [facility] If your symptoms worsen or you develop new symptoms please let us know.   Thank you for coming to see [place] at [facility]. I hope we have been able to provide you high quality care today.  You may receive a patient satisfaction survey over the next few weeks. We would appreciate your feedback and comments so that we may continue to improve ourselves and the health of our patients.

## 2018-06-14 NOTE — Progress Notes (Signed)
PATIENT: Theresa Ferguson DOB: 06-Jan-1946  REASON FOR VISIT: follow up HISTORY FROM: patient  HISTORY OF PRESENT ILLNESS: Today 06/14/18:  Theresa Ferguson is a 72 year old female with a history of neuropathy and restless leg syndrome.  She returns today for follow-up.  She is taking 800 mg of gabapentin in the morning and at noon and 1600 mg at bedtime.  She reports that she has good days and bad days.  She states that she has about 4 bad days as of this.  She states sometimes been tingling sensation travels up the leg.  She denies any significant changes with her gait or balance.  She reports that she was unable to get the compounded cream from [facility] because of the cost  She states that her pharmacy does compounded cream and is asking that I sent her prescription there.  She returns today for follow-up.  HISTORY 06/14/17 Theresa Ferguson is a 72 year old female with a history of neuropathy and restless leg syndrome. She returns today for follow-up. She is currently taking gabapentin 800 mg in the morning and at noon and 600 mg in the evening. She reports that some weeks her pain is horrible and other weeks she does not even notice it. She states that she has numbness in the feet that extends to the ankle. She describes her discomfort as a burning tingling sensation in the feet. She describes it as if she is being stung by a bee. She states that she did break her left ankle and just recently got out of her boot. She states that she was coming downstairs and her left hip gave out and she fell. She denies any significant changes to her gait or balance due to her neuropathy. She does not use a cane or walker. She returns today for an evaluation.  REVIEW OF SYSTEMS: Out of a complete 14 system review of symptoms, the patient complains only of the following symptoms, and all other reviewed systems are negative.  ALLERGIES: Allergies  Allergen Reactions  . Codeine   . Latex   . Morphine And  Related   . Nickel   . Penicillins     HOME MEDICATIONS: Outpatient Medications Prior to Visit  Medication Sig Dispense Refill  . aspirin 81 MG tablet Take 81 mg by mouth daily.    . celecoxib (CELEBREX) 200 MG capsule Take 200 mg by mouth daily.    Marland Kitchen escitalopram (LEXAPRO) 10 MG tablet Take 10 mg by mouth daily.    . fluticasone (FLONASE) 50 MCG/ACT nasal spray Place 1 spray into both nostrils as needed.     . gabapentin (NEURONTIN) 800 MG tablet Take one by mouth at morning . Lunch and 2 at night. 360 tablet 3  . lisinopril-hydrochlorothiazide (PRINZIDE,ZESTORETIC) 20-12.5 MG per tablet Take 1 tablet by mouth daily.    . metFORMIN (GLUCOPHAGE) 500 MG tablet Take 500 mg by mouth 2 (two) times daily. At bedtime    . Multiple Vitamin (MULTIVITAMIN) capsule Take 1 capsule by mouth daily.    . pantoprazole (PROTONIX) 40 MG tablet Take 40 mg by mouth daily.    . rosuvastatin (CRESTOR) 10 MG tablet Take 10 mg by mouth daily.     No facility-administered medications prior to visit.     PAST MEDICAL HISTORY: Past Medical History:  Diagnosis Date  . CTS (carpal tunnel syndrome)    bil, by EM[facility]/[place]  . Hyperlipemia   . Hypertension   . 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  Radiculopathy of lumbosacral region 02/02/2013  . RLS (restless legs syndrome) 02/05/2015    PAST SURGICAL HISTORY: Past Surgical History:  Procedure Laterality Date  . FINGER SURGERY Right 1998   [facility]  . kidney stones  1980  . KNEE SURGERY Bilateral    partial right-2008,left full replacement-2006-Dr [facility]oot[facility]  . LAMINECTOMY     l4-5  . LUMBAR DISC SURGERY      FAMILY HISTORY: Family History  Problem Relation Age of Onset  . Diabetes Son     SOCIAL HISTORY: Social History   Socioeconomic History  . Marital status: Married    Spouse name: Jeneen Rinks  . Number of children: 2  . Years of education: College  . Highest education level: Not on file  Occupational History  . 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 Financial resource strain: Not on file  . Food insecurity:    Worry: Not on file    Inability: Not on file  . Transportation needs:    Medical: Not on file    Non-medical: Not on file  Tobacco Use  . Smoking status: Former Research scientist (life sciences)  . Smokeless tobacco: Never Used  Substance and Sexual Activity  . Alcohol use: Yes    Comment: socially  . Drug use: No  . Sexual activity: Not on file  Lifestyle  . Physical activity:    Days per week: Not on file    Minutes per session: Not on file  . Stress: Not on file  Relationships  . Social connections:    Talks on phone: Not on file    Gets together: Not on file    Attends religious service: Not on file    Active member of club or organization: Not on file    Attends meetings of clubs or organizations: Not on file    Relationship status: Not on file  . Intimate partner violence:    Fear of current or ex partner: Not on file    Emotionally abused: Not on file    Physically abused: Not on file    Forced sexual activity: Not on file  Other Topics Concern  . Not on file  Social History Narrative   Patient is married Jeneen Rinks) and lives at home with her husband.   Patient has two adult children.   Patient is retired.   Patient has a college education.   Patient is right-handed.   Patient drinks two cups of coffee daily.      PHYSICAL EXAM  Vitals:   06/14/18 1009  BP: (!) 161/78  Pulse: 70  Weight: 163 lb 6.4 oz (74.1 kg)  Height: 5\' 5"  (1.651 m)   Body mass index is 27.19 kg/m.  Generalized: Well developed, in no acute distress   Neurological examination  Mentation: Alert oriented to time, place, history taking. Follows all commands speech and language fluent Cranial nerve II-XII: Pupils were equal round reactive to light. Extraocular movements were full, visual field were full on confrontational test. Facial sensation and strength were normal.  Uvula tongue midline. Head turning and shoulder shrug  were normal and symmetric. Motor: The motor testing reveals 5 over 5 strength of all 4 extremities. Good symmetric motor tone is noted throughout.  Sensory: Sensory testing is intact to soft touch on all 4 extremities. No evidence of extinction is noted.  Coordination: Cerebellar testing reveals good finger-nose-finger and heel-to-shin bilaterally.  Gait and station: Gait is  normal. Tandem gait is normal. Romberg is negative. No drift is seen.  Reflexes: Deep tendon reflexes are symmetric and normal bilaterally.   DIAGNOSTIC DATA (LABS, IMAGING, TESTING) - I reviewed patient records, labs, notes, testing and imaging myself where available.     ASSESSMENT AND PLAN 72 y.o. year old female  has a past medical history of CTS (carpal tunnel syndrome), Hyperlipemia, Hypertension, Neuropathy in diabetes ([place]) (02/02/2013), Radiculopathy of lumbosacral region (02/02/2013), and RLS (restless legs syndrome) (02/05/2015). here with:  1.  Peripheral neuropathy  The patient will continue on gabapentin taking 800 mg in the morning and at noon and 1600 mg at bedtime.  I will order a compounded cream and send it to her local pharmacy.  I have advised that if her symptoms worsen or she develops new symptoms she should let us know.  She will follow-up in 1 year or sooner if needed.  I spent 15 minutes with the patient. 50% of this time was spent reviewing her plan of care   Ward Givens, MSN, NP-C 06/14/2018, 10:39 AM [facility] [address] [place], [place] [address] [phone]

## 2018-06-16 NOTE — Progress Notes (Signed)
Compounded neuropathy cream Rx faxed to [facility] pharmacy.

## 2018-07-25 DIAGNOSIS — Z23 Encounter for immunization: Secondary | ICD-10-CM | POA: Diagnosis not present

## 2018-08-08 DIAGNOSIS — Z0000 Encounter for general adult medical examination without abnormal findings: Secondary | ICD-10-CM | POA: Diagnosis not present

## 2018-08-08 DIAGNOSIS — B029 Zoster without complications: Secondary | ICD-10-CM | POA: Diagnosis not present

## 2018-08-08 DIAGNOSIS — Z1389 Encounter for screening for other disorder: Secondary | ICD-10-CM | POA: Diagnosis not present

## 2018-08-08 DIAGNOSIS — K219 Gastro-esophageal reflux disease without esophagitis: Secondary | ICD-10-CM | POA: Diagnosis not present

## 2018-08-08 DIAGNOSIS — E782 Mixed hyperlipidemia: Secondary | ICD-10-CM | POA: Diagnosis not present

## 2018-08-08 DIAGNOSIS — M5136 Other intervertebral disc degeneration, lumbar region: Secondary | ICD-10-CM | POA: Diagnosis not present

## 2018-08-08 DIAGNOSIS — I129 Hypertensive chronic kidney disease with stage 1 through stage 4 chronic kidney disease, or unspecified chronic kidney disease: Secondary | ICD-10-CM | POA: Diagnosis not present

## 2018-08-08 DIAGNOSIS — Z1159 Encounter for screening for other viral diseases: Secondary | ICD-10-CM | POA: Diagnosis not present

## 2018-08-08 DIAGNOSIS — F39 Unspecified mood [affective] disorder: Secondary | ICD-10-CM | POA: Diagnosis not present

## 2018-08-08 DIAGNOSIS — D509 Iron deficiency anemia, unspecified: Secondary | ICD-10-CM | POA: Diagnosis not present

## 2018-08-08 DIAGNOSIS — E1149 Type 2 diabetes mellitus with other diabetic neurological complication: Secondary | ICD-10-CM | POA: Diagnosis not present

## 2018-08-08 DIAGNOSIS — N182 Chronic kidney disease, stage 2 (mild): Secondary | ICD-10-CM | POA: Diagnosis not present

## 2018-08-08 DIAGNOSIS — E1121 Type 2 diabetes mellitus with diabetic nephropathy: Secondary | ICD-10-CM | POA: Diagnosis not present

## 2018-10-03 DIAGNOSIS — M25531 Pain in right wrist: Secondary | ICD-10-CM | POA: Diagnosis not present

## 2018-10-06 DIAGNOSIS — Y999 Unspecified external cause status: Secondary | ICD-10-CM | POA: Diagnosis not present

## 2018-10-06 DIAGNOSIS — G8918 Other acute postprocedural pain: Secondary | ICD-10-CM | POA: Diagnosis not present

## 2018-10-06 DIAGNOSIS — S52571A Other intraarticular fracture of lower end of right radius, initial encounter for closed fracture: Secondary | ICD-10-CM | POA: Diagnosis not present

## 2018-10-06 DIAGNOSIS — S52501A Unspecified fracture of the lower end of right radius, initial encounter for closed fracture: Secondary | ICD-10-CM | POA: Diagnosis not present

## 2018-10-06 DIAGNOSIS — X58XXXA Exposure to other specified factors, initial encounter: Secondary | ICD-10-CM | POA: Diagnosis not present

## 2018-10-21 DIAGNOSIS — S52501D Unspecified fracture of the lower end of right radius, subsequent encounter for closed fracture with routine healing: Secondary | ICD-10-CM | POA: Diagnosis not present

## 2018-11-11 DIAGNOSIS — S52501D Unspecified fracture of the lower end of right radius, subsequent encounter for closed fracture with routine healing: Secondary | ICD-10-CM | POA: Diagnosis not present

## 2018-12-12 DIAGNOSIS — S52501D Unspecified fracture of the lower end of right radius, subsequent encounter for closed fracture with routine healing: Secondary | ICD-10-CM | POA: Diagnosis not present

## 2019-01-05 DIAGNOSIS — W540XXA Bitten by dog, initial encounter: Secondary | ICD-10-CM | POA: Diagnosis not present

## 2019-01-05 DIAGNOSIS — Z23 Encounter for immunization: Secondary | ICD-10-CM | POA: Diagnosis not present

## 2019-01-05 DIAGNOSIS — S61211A Laceration without foreign body of left index finger without damage to nail, initial encounter: Secondary | ICD-10-CM | POA: Diagnosis not present

## 2019-02-01 ENCOUNTER — Telehealth: Payer: Self-pay | Admitting: *Deleted

## 2019-02-01 NOTE — Telephone Encounter (Signed)
Link has been sent to the patient's e-mail.

## 2019-02-01 NOTE — Telephone Encounter (Addendum)
Spoke to pt. Rescheduled appt to Debbora Presto, NP on 02-22-19 at 0930.  Consented to [email or web address] visit Due to current COVID 19 pandemic, our office is severely reducing in office visits until further notice, in order to minimize the risk to our patients and healthcare providers.  Pt understands that although there may be some limitations with this type of visit, we will take all precautions to reduce any security or privacy concerns.  Pt understands that this will be treated like an in office visit and we will file with pt's insurance, and there may be a patient responsible charge related to this service. Email:  [email or web address]

## 2019-02-08 DIAGNOSIS — E1121 Type 2 diabetes mellitus with diabetic nephropathy: Secondary | ICD-10-CM | POA: Diagnosis not present

## 2019-02-08 DIAGNOSIS — D509 Iron deficiency anemia, unspecified: Secondary | ICD-10-CM | POA: Diagnosis not present

## 2019-02-21 ENCOUNTER — Ambulatory Visit: Payer: Medicare Other | Admitting: Adult Health

## 2019-02-22 ENCOUNTER — Other Ambulatory Visit: Payer: Self-pay

## 2019-02-22 ENCOUNTER — Encounter: Payer: Self-pay | Admitting: Family Medicine

## 2019-02-22 ENCOUNTER — Ambulatory Visit (INDEPENDENT_AMBULATORY_CARE_PROVIDER_SITE_OTHER): Payer: Medicare Other | Admitting: Family Medicine

## 2019-02-22 DIAGNOSIS — G629 Polyneuropathy, unspecified: Secondary | ICD-10-CM

## 2019-02-22 DIAGNOSIS — G2581 Restless legs syndrome: Secondary | ICD-10-CM | POA: Diagnosis not present

## 2019-02-22 MED ORDER — NONFORMULARY OR COMPOUNDED ITEM: 5 refills | Status: DC

## 2019-02-22 MED ORDER — GABAPENTIN 800 MG PO TABS: ORAL_TABLET | ORAL | 3 refills | Status: DC

## 2019-02-22 NOTE — Progress Notes (Signed)
PATIENT: Theresa Ferguson DOB: 1946/02/25  REASON FOR VISIT: follow up HISTORY FROM: patient  Virtual Visit via Telephone Note  I connected with Theresa Ferguson on 02/22/19 at  9:30 AM EDT by telephone and verified that I am speaking with the correct person using two identifiers.   I discussed the limitations, risks, security and privacy concerns of performing an evaluation and management service by telephone and the availability of in person appointments. I also discussed with the patient that there may be a patient responsible charge related to this service. The patient expressed understanding and agreed to proceed.   History of Present Illness:  02/22/19 Theresa Ferguson is a 73 y.o. female here today for follow up for RLS and neuropathy. She continues gabapentin 800mg  in the am and noon and 1600mg  at night. She is also using compounded cream as needed. During the summer months she is able to exercise more and does not need the cream as often. She has a pool at her home that she uses for exercise. She is doing well today and without complaints.  History (copied from [facility] note on 06/14/2018)  Theresa Ferguson is a 73 year old female with a history of neuropathy and restless leg syndrome.  She returns today for follow-up.  She is taking 800 mg of gabapentin in the morning and at noon and 1600 mg at bedtime.  She reports that she has good days and bad days.  She states that she has about 4 bad days as of this.  She states sometimes been tingling sensation travels up the leg.  She denies any significant changes with her gait or balance.  She reports that she was unable to get the compounded cream from [facility] because of the cost  She states that her pharmacy does compounded cream and is asking that I sent her prescription there.  She returns today for follow-up.  HISTORY 06/14/17 Theresa Ferguson is a 73 year old female with a history of neuropathy and restless leg syndrome. She  returns today for follow-up. She is currently taking gabapentin 800 mg in the morning and at noon and 600 mg in the evening. She reports that some weeks her pain is horrible and other weeks she does not even notice it. She states that she has numbness in the feet that extends to the ankle. She describes her discomfort as a burning tingling sensation in the feet. She describes it as if she is being stung by a bee. She states that she did break her left ankle and just recently got out of her boot. She states that she was coming downstairsandher left hip gave out and she fell. She denies any significant changes to her gait or balance due to her neuropathy. She does not use a cane or walker. She returns today for an evaluation.   Observations/Objective:  Generalized: Well developed, in no acute distress  Mentation: Alert oriented to time, place, history taking. Follows all commands speech and language fluent   Assessment and Plan:  73 y.o. year old female  has a past medical history of CTS (carpal tunnel syndrome), Hyperlipemia, Hypertension, Neuropathy in diabetes ([place]) (02/02/2013), Radiculopathy of lumbosacral region (02/02/2013), and RLS (restless legs syndrome) (02/05/2015). here with    ICD-10-CM   1. RLS (restless legs syndrome)  G25.81 NONFORMULARY OR COMPOUNDED ITEM    gabapentin (NEURONTIN) 800 MG tablet  2. Neuropathy  G62.9 NONFORMULARY OR COMPOUNDED ITEM    gabapentin (NEURONTIN) 800 MG tablet   She continues  to do well with current regimen of gabapentin 800 g in the morning, 800 mg at noon and 1600 mg in the evening.  She continues her compound cream as needed.  She was advised to continue a healthy lifestyle with well-balanced diet and regular exercise.  Adequate hydration discussed.  She was advised to follow-up in 1 year.  Appointment has been scheduled.  No orders of the defined types were placed in this encounter.   Meds ordered this encounter  Medications  . NONFORMULARY OR  COMPOUNDED ITEM    Sig: Amantadine 8%, Baclofen 2%, gabapentin 6%, amitriptyline 4%, bupivacaine 2%, clonidine 2%    Dispense:  1 each    Refill:  5    Order Specific Question:   Supervising Provider    Answer:   Melvenia Beam [id number]  . gabapentin (NEURONTIN) 800 MG tablet    Sig: Take one by mouth at morning . Lunch and 2 at night.    Dispense:  360 tablet    Refill:  3    Order Specific Question:   Supervising Provider    Answer:   Melvenia Beam [id number]     Follow Up Instructions:  I discussed the assessment and treatment plan with the patient. The patient was provided an opportunity to ask questions and all were answered. The patient agreed with the plan and demonstrated an understanding of the instructions.   The patient was advised to call back or seek an in-person evaluation if the symptoms worsen or if the condition fails to improve as anticipated.  I provided 15 minutes of non-face-to-face time during this encounter.  Patient is located at her place of residence during video conference.  Provider is located in the office.  Maryelizabeth Kaufmann, CMA helped to facilitate visit.   Debbora Presto, NP

## 2019-06-30 DIAGNOSIS — Z23 Encounter for immunization: Secondary | ICD-10-CM | POA: Diagnosis not present

## 2019-09-04 ENCOUNTER — Ambulatory Visit: Payer: Medicare Other | Attending: Internal Medicine

## 2019-09-04 DIAGNOSIS — Z20822 Contact with and (suspected) exposure to covid-19: Secondary | ICD-10-CM

## 2019-09-05 LAB — NOVEL CORONAVIRUS, NAA: SARS-CoV-2, NAA: NOT DETECTED

## 2019-09-20 DIAGNOSIS — I129 Hypertensive chronic kidney disease with stage 1 through stage 4 chronic kidney disease, or unspecified chronic kidney disease: Secondary | ICD-10-CM | POA: Diagnosis not present

## 2019-09-20 DIAGNOSIS — E1149 Type 2 diabetes mellitus with other diabetic neurological complication: Secondary | ICD-10-CM | POA: Diagnosis not present

## 2019-09-20 DIAGNOSIS — E782 Mixed hyperlipidemia: Secondary | ICD-10-CM | POA: Diagnosis not present

## 2019-09-20 DIAGNOSIS — N182 Chronic kidney disease, stage 2 (mild): Secondary | ICD-10-CM | POA: Diagnosis not present

## 2019-09-20 DIAGNOSIS — E1121 Type 2 diabetes mellitus with diabetic nephropathy: Secondary | ICD-10-CM | POA: Diagnosis not present

## 2019-09-25 ENCOUNTER — Ambulatory Visit: Payer: Medicare Other | Attending: Internal Medicine

## 2019-09-25 ENCOUNTER — Other Ambulatory Visit: Payer: Medicare Other

## 2019-09-25 DIAGNOSIS — Z20822 Contact with and (suspected) exposure to covid-19: Secondary | ICD-10-CM | POA: Diagnosis not present

## 2019-09-26 LAB — NOVEL CORONAVIRUS, NAA: SARS-CoV-2, NAA: NOT DETECTED

## 2019-09-27 DIAGNOSIS — E1121 Type 2 diabetes mellitus with diabetic nephropathy: Secondary | ICD-10-CM | POA: Diagnosis not present

## 2019-10-07 ENCOUNTER — Ambulatory Visit: Payer: Medicare Other | Attending: Internal Medicine

## 2019-10-07 DIAGNOSIS — Z23 Encounter for immunization: Secondary | ICD-10-CM | POA: Insufficient documentation

## 2019-10-07 NOTE — Progress Notes (Signed)
   Covid-19 Vaccination Clinic  Name:  ROMAINE KEMMER    MRN: [id number] DOB: 1945/11/05  10/07/2019  Ms. Lunt was observed post Covid-19 immunization for 15 minutes without incidence. She was provided with Vaccine Information Sheet and instruction to access the V-Safe system.   Ms. Hyett was instructed to call 911 with any severe reactions post vaccine: Marland Kitchen Difficulty breathing  . Swelling of your face and throat  . A fast heartbeat  . A bad rash all over your body  . Dizziness and weakness    Immunizations Administered    Name Date Dose VIS Date Route   Pfizer COVID-19 Vaccine 10/07/2019  8:45 AM 0.3 mL 08/11/2019 Intramuscular   Manufacturer: [place]   Lot: [id number]   [place]: [id number]

## 2019-10-18 DIAGNOSIS — H903 Sensorineural hearing loss, bilateral: Secondary | ICD-10-CM | POA: Diagnosis not present

## 2019-10-18 DIAGNOSIS — J320 Chronic maxillary sinusitis: Secondary | ICD-10-CM | POA: Diagnosis not present

## 2019-10-31 ENCOUNTER — Ambulatory Visit: Payer: Medicare Other | Attending: Internal Medicine

## 2019-10-31 DIAGNOSIS — Z23 Encounter for immunization: Secondary | ICD-10-CM | POA: Insufficient documentation

## 2019-10-31 NOTE — Progress Notes (Signed)
   Covid-19 Vaccination Clinic  Name:  Theresa Ferguson    MRN: [id number] DOB: [birth year]  10/31/2019  Theresa Ferguson was observed post Covid-19 immunization for 15 minutes without incident. She was provided with Vaccine Information Sheet and instruction to access the V-Safe system.   Theresa Ferguson was instructed to call 911 with any severe reactions post vaccine: Marland Kitchen Difficulty breathing  . Swelling of face and throat  . A fast heartbeat  . A bad rash all over body  . Dizziness and weakness   Immunizations Administered    Name Date Dose VIS Date Route   Pfizer COVID-19 Vaccine 10/31/2019 10:48 AM 0.3 mL 08/11/2019 Intramuscular   Manufacturer: [place]   Lot: [id number]   [place]: [id number]

## 2019-11-03 DIAGNOSIS — J349 Unspecified disorder of nose and nasal sinuses: Secondary | ICD-10-CM | POA: Diagnosis not present

## 2019-11-03 DIAGNOSIS — H9121 Sudden idiopathic hearing loss, right ear: Secondary | ICD-10-CM | POA: Diagnosis not present

## 2019-11-06 ENCOUNTER — Other Ambulatory Visit: Payer: Self-pay | Admitting: Otolaryngology

## 2019-11-06 DIAGNOSIS — H9319 Tinnitus, unspecified ear: Secondary | ICD-10-CM

## 2019-11-24 DIAGNOSIS — J301 Allergic rhinitis due to pollen: Secondary | ICD-10-CM | POA: Diagnosis not present

## 2019-12-01 ENCOUNTER — Ambulatory Visit
Admission: RE | Admit: 2019-12-01 | Discharge: 2019-12-01 | Disposition: A | Discharge status: Home / Self Care | Payer: Medicare Other | Source: Ambulatory Visit | Attending: Otolaryngology | Admitting: Otolaryngology

## 2019-12-01 ENCOUNTER — Other Ambulatory Visit: Payer: Self-pay

## 2019-12-01 DIAGNOSIS — H9193 Unspecified hearing loss, bilateral: Secondary | ICD-10-CM | POA: Diagnosis not present

## 2019-12-01 DIAGNOSIS — H9319 Tinnitus, unspecified ear: Secondary | ICD-10-CM

## 2019-12-01 DIAGNOSIS — H9311 Tinnitus, right ear: Secondary | ICD-10-CM | POA: Diagnosis not present

## 2019-12-01 MED ORDER — GADOBENATE DIMEGLUMINE 529 MG/ML IV SOLN
14.0000 mL | Freq: Once | INTRAVENOUS | Status: AC | PRN
  Administered 2019-12-01: 14 mL via INTRAVENOUS

## 2019-12-11 ENCOUNTER — Other Ambulatory Visit: Payer: Self-pay | Admitting: Otolaryngology

## 2019-12-11 DIAGNOSIS — H93A9 Pulsatile tinnitus, unspecified ear: Secondary | ICD-10-CM

## 2019-12-11 DIAGNOSIS — H93A1 Pulsatile tinnitus, right ear: Secondary | ICD-10-CM | POA: Diagnosis not present

## 2019-12-11 DIAGNOSIS — H9121 Sudden idiopathic hearing loss, right ear: Secondary | ICD-10-CM | POA: Diagnosis not present

## 2019-12-19 ENCOUNTER — Ambulatory Visit
Admission: RE | Admit: 2019-12-19 | Discharge: 2019-12-19 | Disposition: A | Discharge status: Home / Self Care | Payer: Medicare Other | Source: Ambulatory Visit | Attending: Otolaryngology | Admitting: Otolaryngology

## 2019-12-19 ENCOUNTER — Other Ambulatory Visit: Payer: Self-pay

## 2019-12-19 DIAGNOSIS — H93A9 Pulsatile tinnitus, unspecified ear: Secondary | ICD-10-CM | POA: Diagnosis not present

## 2019-12-19 DIAGNOSIS — H9191 Unspecified hearing loss, right ear: Secondary | ICD-10-CM | POA: Diagnosis not present

## 2019-12-19 LAB — POCT I-STAT CREATININE: Creatinine, Ser: 0.8 mg/dL (ref 0.44–1.00)

## 2019-12-19 MED ORDER — IOHEXOL 300 MG/ML  SOLN
75.0000 mL | Freq: Once | INTRAMUSCULAR | Status: AC | PRN
  Administered 2019-12-19: 75 mL via INTRAVENOUS

## 2020-01-02 ENCOUNTER — Other Ambulatory Visit: Payer: Self-pay | Admitting: Otolaryngology

## 2020-01-02 DIAGNOSIS — J340 Abscess, furuncle and carbuncle of nose: Secondary | ICD-10-CM | POA: Diagnosis not present

## 2020-01-02 DIAGNOSIS — H903 Sensorineural hearing loss, bilateral: Secondary | ICD-10-CM | POA: Diagnosis not present

## 2020-01-02 DIAGNOSIS — D37030 Neoplasm of uncertain behavior of the parotid salivary glands: Secondary | ICD-10-CM | POA: Diagnosis not present

## 2020-01-02 DIAGNOSIS — R599 Enlarged lymph nodes, unspecified: Secondary | ICD-10-CM

## 2020-01-02 DIAGNOSIS — H93A1 Pulsatile tinnitus, right ear: Secondary | ICD-10-CM | POA: Diagnosis not present

## 2020-02-26 ENCOUNTER — Telehealth: Payer: Self-pay | Admitting: *Deleted

## 2020-02-26 ENCOUNTER — Ambulatory Visit (INDEPENDENT_AMBULATORY_CARE_PROVIDER_SITE_OTHER): Payer: Medicare Other | Admitting: Family Medicine

## 2020-02-26 ENCOUNTER — Encounter: Payer: Self-pay | Admitting: Family Medicine

## 2020-02-26 ENCOUNTER — Other Ambulatory Visit: Payer: Self-pay

## 2020-02-26 VITALS — BP 124/74 | HR 75 | Ht 65.0 in | Wt 154.0 lb

## 2020-02-26 DIAGNOSIS — G629 Polyneuropathy, unspecified: Secondary | ICD-10-CM

## 2020-02-26 DIAGNOSIS — G2581 Restless legs syndrome: Secondary | ICD-10-CM | POA: Diagnosis not present

## 2020-02-26 DIAGNOSIS — R202 Paresthesia of skin: Secondary | ICD-10-CM | POA: Diagnosis not present

## 2020-02-26 MED ORDER — NONFORMULARY OR COMPOUNDED ITEM: 11 refills | Status: DC

## 2020-02-26 MED ORDER — GABAPENTIN 800 MG PO TABS: ORAL_TABLET | ORAL | 3 refills | Status: DC

## 2020-02-26 NOTE — Telephone Encounter (Signed)
Printed prescription faxed to [facility] [phone] in [place]. [place]

## 2020-02-26 NOTE — Progress Notes (Signed)
PATIENT: SAAVI MCEACHRON DOB: March 23, 1946  REASON FOR VISIT: follow up HISTORY FROM: patient  Chief Complaint  Patient presents with  . Follow-up    rm 5, RLS, pt reports leg and arm tingling     HISTORY OF PRESENT ILLNESS: Today 02/26/20 OZA OBERLE is a 74 y.o. female here today for follow up for RLS and neuropathy. She is doing ok. She continues to have numbness and tingling of bilateral feet. Symptoms wax and wane. She does still have restless leg symptoms as well. Some days are better than others. Neuropathy cream does seem to help. She has noticed tingling of both hands recently. Symptoms seem to have come about over the past 2-3 months. She is s/p internal fixation with titanium rod placement of right wrist in 10/2018 for fractured wrist. She has also had hand surgery for removal of nodules in the past. She has neck "tenderness" but no significant pain. She is followed by Dr Mardelle Matte. She is very active. PCP follows labs twice a year. She continues iron, B12 and magnesium supplements. Labs were last checked in 09/2019. Last A1C was 6.4.   [facility] in [place] is [employer], [employer].   HISTORY: (copied from my note on 02/22/2019)  KONNOR JORDEN is a 74 y.o. female here today for follow up for RLS and neuropathy. She continues gabapentin 800mg  in the am and noon and 1600mg  at night. She is also using compounded cream as needed. During the summer months she is able to exercise more and does not need the cream as often. She has a pool at her home that she uses for exercise. She is doing well today and without complaints.  History (copied from [employer] note on 06/14/2018)  Ms. Speirs a 74 year old female with a history of neuropathy and restless leg syndrome. She returns today for follow-up. She is taking 800 mg of gabapentin in the morning and at noon and 1600 mg at bedtime. She reports that she has good days and bad days. She states that she has about 4 bad days as  of this. She states sometimes been tingling sensation travels up the leg. She denies any significant changes with her gait or balance. She reports that she was unable to get the compounded cream from [facility] because of the costShe states that her pharmacy doescompounded cream and isasking that I sent her prescription there. She returns today for follow-up.  HISTORY10/15/18 Ms. Suits is a 74 year old female with a history of neuropathy and restless leg syndrome. She returns today for follow-up. She is currently taking gabapentin 800 mg in the morning and at noon and 600 mg in the evening. She reports that some weeks her pain is horrible and other weeks she does not even notice it. She states that she has numbness in the feet that extends to the ankle. She describes her discomfort as a burning tingling sensation in the feet. She describes it as if she is being stung by a bee. She states that she did break her left ankle and just recently got out of her boot. She states that she was coming downstairsandher left hip gave out and she fell. She denies any significant changes to her gait or balance due to her neuropathy. She does not use a cane or walker. She returns today for an evaluation.   REVIEW OF SYSTEMS: Out of a complete 14 system review of symptoms, the patient complains only of the following symptoms, restless legs, tingling of bilateral  hands and nueopathy and all other reviewed systems are negative.  ALLERGIES: Allergies  Allergen Reactions  . Codeine   . Latex   . Morphine And Related   . Nickel   . Penicillins     HOME MEDICATIONS: Outpatient Medications Prior to Visit  Medication Sig Dispense Refill  . aspirin 81 MG tablet Take 81 mg by mouth daily.    . celecoxib (CELEBREX) 200 MG capsule Take 200 mg by mouth daily.    Marland Kitchen escitalopram (LEXAPRO) 10 MG tablet Take 10 mg by mouth daily.    . ferrous sulfate ([facility] IRON) 325 (65 FE) MG tablet Take 325 mg by  mouth daily with breakfast.    . fluticasone (FLONASE) 50 MCG/ACT nasal spray Place 1 spray into both nostrils as needed.     Marland Kitchen lisinopril-hydrochlorothiazide (PRINZIDE,ZESTORETIC) 20-12.5 MG per tablet Take 1 tablet by mouth daily.    . magnesium oxide (MAG-OX) 400 MG tablet Take by mouth.    . metFORMIN (GLUCOPHAGE) 500 MG tablet Take 500 mg by mouth 2 (two) times daily. At bedtime    . Multiple Vitamin (MULTIVITAMIN) capsule Take 1 capsule by mouth daily.    . pantoprazole (PROTONIX) 40 MG tablet Take 40 mg by mouth daily.    . rosuvastatin (CRESTOR) 10 MG tablet Take 10 mg by mouth daily.    Marland Kitchen gabapentin (NEURONTIN) 800 MG tablet Take one by mouth at morning . Lunch and 2 at night. 360 tablet 3  . NONFORMULARY OR COMPOUNDED ITEM Amantadine 8%, Baclofen 2%, gabapentin 6%, amitriptyline 4%, bupivacaine 2%, clonidine 2% 1 each 5   No facility-administered medications prior to visit.    PAST MEDICAL HISTORY: Past Medical History:  Diagnosis Date  . CTS (carpal tunnel syndrome)    bil, by EMG/[place]  . Hyperlipemia   . Hypertension   . 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 Radiculopathy of lumbosacral region 02/02/2013  . RLS (restless legs syndrome) 02/05/2015    PAST SURGICAL HISTORY: Past Surgical History:  Procedure Laterality Date  . FINGER SURGERY Right 1998   [facility]  . kidney stones  1980  . KNEE SURGERY Bilateral    partial right-2008,left full replacement-2006-Dr [facility]  . LAMINECTOMY     l4-5  . LUMBAR DISC SURGERY      FAMILY HISTORY: Family History  Problem Relation Age of Onset  . Diabetes Son     SOCIAL HISTORY: Social History   Socioeconomic History  . Marital status: Married    Spouse name: Jeneen Rinks  . Number of children: 2  . Years of education: College  . Highest education level: Not on file  Occupational History  . 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 Smoking status: Former  Research scientist (life sciences)  . Smokeless tobacco: Never Used  Substance and Sexual Activity  . Alcohol use: Yes    Comment: socially  . Drug use: No  . Sexual activity: Not on file  Other Topics Concern  . Not on file  Social History Narrative   Patient is married Jeneen Rinks) and lives at home with her husband.   Patient has two adult children.   Patient is retired.   Patient has a college education.   Patient is right-handed.   Patient drinks two cups of coffee daily.   Social Determinants of Health   Financial Resource Strain:   . Difficulty of Paying Living Expenses:   Food Insecurity:   .  Worried About Charity fundraiser in the Last Year:   . Arboriculturist in the Last Year:   Transportation Needs:   . Film/video editor (Medical):   Marland Kitchen Lack of Transportation (Non-Medical):   Physical Activity:   . Days of Exercise per Week:   . Minutes of Exercise per Session:   Stress:   . Feeling of Stress :   Social Connections:   . Frequency of Communication with Friends and Family:   . Frequency of Social Gatherings with Friends and Family:   . Attends Religious Services:   . Active Member of Clubs or Organizations:   . Attends Archivist Meetings:   Marland Kitchen Marital Status:   Intimate Partner Violence:   . Fear of Current or Ex-Partner:   . Emotionally Abused:   Marland Kitchen Physically Abused:   . Sexually Abused:       PHYSICAL EXAM  Vitals:   02/26/20 0927  BP: 124/74  Pulse: 75  Weight: 154 lb (69.9 kg)  Height: 5\' 5"  (1.651 m)   Body mass index is 25.63 kg/m.  Generalized: Well developed, in no acute distress  Cardiology: normal rate and rhythm, no murmur noted Respiratory: clear to auscultation bilaterally  Neurological examination  Mentation: Alert oriented to time, place, history taking. Follows all commands speech and language fluent Cranial nerve II-XII: Pupils were equal round reactive to light. Extraocular movements were full, visual field were full . Motor: The motor  testing reveals 5 over 5 strength of all 4 extremities. Good symmetric motor tone is noted throughout.  Sensory: Sensory testing is intact to soft touch on all 4 extremities. Positive Tinel and Phalen signs. No evidence of extinction is noted.  Coordination: Cerebellar testing reveals good finger-nose-finger and heel-to-shin bilaterally.  Gait and station: Gait is normal. Romberg is negative. No drift is seen.  Reflexes: Deep tendon reflexes are symmetric and normal bilaterally.   DIAGNOSTIC DATA (LABS, IMAGING, TESTING) - I reviewed patient records, labs, notes, testing and imaging myself where available.  No flowsheet data found.   No results found for: WBC, HGB, HCT, MCV, PLT    Component Value Date/Time   K 4.0 09/20/2014 1223   CREATININE 0.80 12/19/2019 1454   No results found for: CHOL, HDL, LDLCALC, LDLDIRECT, TRIG, CHOLHDL No results found for: HGBA1C No results found for: VITAMINB12 No results found for: TSH     ASSESSMENT AND PLAN 74 y.o. year old female  has a past medical history of CTS (carpal tunnel syndrome), Hyperlipemia, Hypertension, Neuropathy in diabetes ([place]) (02/02/2013), Radiculopathy of lumbosacral region (02/02/2013), and RLS (restless legs syndrome) (02/05/2015). here with     ICD-10-CM   1. RLS (restless legs syndrome)  G25.81 gabapentin (NEURONTIN) 800 MG tablet    NONFORMULARY OR COMPOUNDED ITEM  2. Neuropathy  G62.9 gabapentin (NEURONTIN) 800 MG tablet    NONFORMULARY OR COMPOUNDED ITEM  3. Paresthesia of both hands  R20.2      [name] is doing well today. She continues gabapentin 800mg  in am, 800mg  early afternoon and 1600mg  at bedtime. She also uses neuropathy cream as needed. She does not wish to change therapy at this time. We have discussed using alpha lipoic acid OTC. She was advised to consider carpal tunnel brace as well. She will follow up with ortho as directed. She will call for worsening symptoms. May consider switching to Lyrica or adding  oxcarbamazepine or Requip if needed pending symptoms. She will follow up annually, sooner if needed. She  verbalizes understanding and agreement with this plan.   No orders of the defined types were placed in this encounter.    Meds ordered this encounter  Medications  . gabapentin (NEURONTIN) 800 MG tablet    Sig: Take one by mouth at morning . Lunch and 2 at night.    Dispense:  360 tablet    Refill:  3    Order Specific Question:   Supervising Provider    Answer:   Melvenia Beam [id number]  . NONFORMULARY OR COMPOUNDED ITEM    Sig: Amantadine 8%, Baclofen 2%, gabapentin 6%, amitriptyline 4%, bupivacaine 2%, clonidine 2%    Dispense:  1 each    Refill:  11    Order Specific Question:   Supervising Provider    Answer:   Melvenia Beam [[id number]]      I spent 15 minutes with the patient. 50% of this time was spent counseling and educating patient on plan of care and medications.    Debbora Presto, FNP-C 02/26/2020, 10:15 AM [facility] [address], [place], [place] [address] [phone]

## 2020-02-26 NOTE — Patient Instructions (Addendum)
We will continue gabapentin and neuropathy cream as prescribed. Try Alpha Lipoic Acid for neuropathy pain if you want.    Follow up in 1 year, sooner if needed    Restless Legs Syndrome Restless legs syndrome is a condition that causes uncomfortable feelings or sensations in the legs, especially while sitting or lying down. The sensations usually cause an overwhelming urge to move the legs. The arms can also sometimes be affected. The condition can range from mild to severe. The symptoms often interfere with a person's ability to sleep. What are the causes? The cause of this condition is not known. What increases the risk? The following factors may make you more likely to develop this condition:  Being older than 50.  Pregnancy.  Being a woman. In general, the condition is more common in women than in men.  A family history of the condition.  Having iron deficiency.  Overuse of caffeine, nicotine, or alcohol.  Certain medical conditions, such as kidney disease, Parkinson's disease, or nerve damage.  Certain medicines, such as those for high blood pressure, nausea, colds, allergies, depression, and some heart conditions. What are the signs or symptoms? The main symptom of this condition is uncomfortable sensations in the legs, such as:  Pulling.  Tingling.  Prickling.  Throbbing.  Crawling.  Burning. Usually, the sensations:  Affect both sides of the body.  Are worse when you sit or lie down.  Are worse at night. These may wake you up or make it difficult to fall asleep.  Make you have a strong urge to move your legs.  Are temporarily relieved by moving your legs. The arms can also be affected, but this is rare. People who have this condition often have tiredness during the day because of their lack of sleep at night. How is this diagnosed? This condition may be diagnosed based on:  Your symptoms.  Blood tests. In some cases, you may be monitored in a  sleep lab by a specialist (a sleep study). This can detect any disruptions in your sleep. How is this treated? This condition is treated by managing the symptoms. This may include:  Lifestyle changes, such as exercising, using relaxation techniques, and avoiding caffeine, alcohol, or tobacco.  Medicines. Anti-seizure medicines may be tried first. Follow these instructions at home:     General instructions  Take over-the-counter and prescription medicines only as told by your health care provider.  Use methods to help relieve the uncomfortable sensations, such as: ? Massaging your legs. ? Walking or stretching. ? Taking a cold or hot bath.  Keep all follow-up visits as told by your health care provider. This is important. Lifestyle  Practice good sleep habits. For example, go to bed and get up at the same time every day. Most adults should get 7-9 hours of sleep each night.  Exercise regularly. Try to get at least 30 minutes of exercise most days of the week.  Practice ways of relaxing, such as yoga or meditation.  Avoid caffeine and alcohol.  Do not use any products that contain nicotine or tobacco, such as cigarettes and e-cigarettes. If you need help quitting, ask your health care provider. Contact a health care provider if:  Your symptoms get worse or they do not improve with treatment. Summary  Restless legs syndrome is a condition that causes uncomfortable feelings or sensations in the legs, especially while sitting or lying down.  The symptoms often interfere with a person's ability to sleep.  This condition  is treated by managing the symptoms. You may need to make lifestyle changes or take medicines. This information is not intended to replace advice given to you by your health care provider. Make sure you discuss any questions you have with your health care provider. Document Revised: 09/06/2017 Document Reviewed: 09/06/2017 Elsevier Patient Education  2020  [employer].   Neuropathic Pain Neuropathic pain is pain caused by damage to the nerves that are responsible for certain sensations in your body (sensory nerves). The pain can be caused by:  Damage to the sensory nerves that send signals to your spinal cord and brain (peripheral nervous system).  Damage to the sensory nerves in your brain or spinal cord (central nervous system). Neuropathic pain can make you more sensitive to pain. Even a minor sensation can feel very painful. This is usually a long-term condition that can be difficult to treat. The type of pain differs from person to person. It may:  Start suddenly (acute), or it may develop slowly and last for a long time (chronic).  Come and go as damaged nerves heal, or it may stay at the same level for years.  Cause emotional distress, loss of sleep, and a lower quality of life. What are the causes? The most common cause of this condition is diabetes. Many other diseases and conditions can also cause neuropathic pain. Causes of neuropathic pain can be classified as:  Toxic. This is caused by medicines and chemicals. The most common cause of toxic neuropathic pain is damage from cancer treatments (chemotherapy).  Metabolic. This can be caused by: ? Diabetes. This is the most common disease that damages the nerves. ? Lack of vitamin B from long-term alcohol abuse.  Traumatic. Any injury that cuts, crushes, or stretches a nerve can cause damage and pain. A common example is feeling pain after losing an arm or leg (phantom limb pain).  Compression-related. If a sensory nerve gets trapped or compressed for a long period of time, the blood supply to the nerve can be cut off.  Vascular. Many blood vessel diseases can cause neuropathic pain by decreasing blood supply and oxygen to nerves.  Autoimmune. This type of pain results from diseases in which the body's defense system (immune system) mistakenly attacks sensory nerves. Examples of  autoimmune diseases that can cause neuropathic pain include lupus and multiple sclerosis.  Infectious. Many types of viral infections can damage sensory nerves and cause pain. Shingles infection is a common cause of this type of pain.  Inherited. Neuropathic pain can be a symptom of many diseases that are passed down through families (genetic). What increases the risk? You are more likely to develop this condition if:  You have diabetes.  You smoke.  You drink too much alcohol.  You are taking certain medicines, including medicines that kill cancer cells (chemotherapy) or that treat immune system disorders. What are the signs or symptoms? The main symptom is pain. Neuropathic pain is often described as:  Burning.  Shock-like.  Stinging.  Hot or cold.  Itching. How is this diagnosed? No single test can diagnose neuropathic pain. It is diagnosed based on:  Physical exam and your symptoms. Your health care provider will ask you about your pain. You may be asked to use a pain scale to describe how bad your pain is.  Tests. These may be done to see if you have a high sensitivity to pain and to help find the cause and location of any sensory nerve damage. They include: ?  Nerve conduction studies to test how well nerve signals travel through your sensory nerves (electrodiagnostic testing). ? Stimulating your sensory nerves through electrodes on your skin and measuring the response in your spinal cord and brain (somatosensory evoked potential).  Imaging studies, such as: ? X-rays. ? CT scan. ? MRI. How is this treated? Treatment for neuropathic pain may change over time. You may need to try different treatment options or a combination of treatments. Some options include:  Treating the underlying cause of the neuropathy, such as diabetes, kidney disease, or vitamin deficiencies.  Stopping medicines that can cause neuropathy, such as chemotherapy.  Medicine to relieve pain.  Medicines may include: ? Prescription or over-the-counter pain medicine. ? Anti-seizure medicine. ? Antidepressant medicines. ? Pain-relieving patches that are applied to painful areas of skin. ? A medicine to numb the area (local anesthetic), which can be injected as a nerve block.  Transcutaneous nerve stimulation. This uses electrical currents to block painful nerve signals. The treatment is painless.  Alternative treatments, such as: ? Acupuncture. ? Meditation. ? Massage. ? Physical therapy. ? Pain management programs. ? Counseling. Follow these instructions at home: Medicines   Take over-the-counter and prescription medicines only as told by your health care provider.  Do not drive or use heavy machinery while taking prescription pain medicine.  If you are taking prescription pain medicine, take actions to prevent or treat constipation. Your health care provider may recommend that you: ? Drink enough fluid to keep your urine pale yellow. ? Eat foods that are high in fiber, such as fresh fruits and vegetables, whole grains, and beans. ? Limit foods that are high in fat and processed sugars, such as fried or sweet foods. ? Take an over-the-counter or prescription medicine for constipation. Lifestyle   Have a good support system at home.  Consider joining a chronic pain support group.  Do not use any products that contain nicotine or tobacco, such as cigarettes and e-cigarettes. If you need help quitting, ask your health care provider.  Do not drink alcohol. General instructions  Learn as much as you can about your condition.  Work closely with all your health care providers to find the treatment plan that works best for you.  Ask your health care provider what activities are safe for you.  Keep all follow-up visits as told by your health care provider. This is important. Contact a health care provider if:  Your pain treatments are not working.  You are having  side effects from your medicines.  You are struggling with tiredness (fatigue), mood changes, depression, or anxiety. Summary  Neuropathic pain is pain caused by damage to the nerves that are responsible for certain sensations in your body (sensory nerves).  Neuropathic pain may come and go as damaged nerves heal, or it may stay at the same level for years.  Neuropathic pain is usually a long-term condition that can be difficult to treat. Consider joining a chronic pain support group. This information is not intended to replace advice given to you by your health care provider. Make sure you discuss any questions you have with your health care provider. Document Revised: 12/08/2018 Document Reviewed: 09/03/2017 Elsevier Patient Education  [employer].

## 2020-02-27 ENCOUNTER — Telehealth: Payer: Self-pay | Admitting: *Deleted

## 2020-02-27 NOTE — Telephone Encounter (Signed)
I called pt and she gave me [employer].  I faxed to them this am with fax confirmation [phone].

## 2020-03-07 ENCOUNTER — Ambulatory Visit
Admission: RE | Admit: 2020-03-07 | Discharge: 2020-03-07 | Disposition: A | Discharge status: Home / Self Care | Payer: Medicare Other | Source: Ambulatory Visit | Attending: Otolaryngology | Admitting: Otolaryngology

## 2020-03-07 ENCOUNTER — Other Ambulatory Visit: Payer: Self-pay

## 2020-03-07 DIAGNOSIS — R599 Enlarged lymph nodes, unspecified: Secondary | ICD-10-CM

## 2020-03-07 DIAGNOSIS — E041 Nontoxic single thyroid nodule: Secondary | ICD-10-CM | POA: Diagnosis not present

## 2020-03-12 ENCOUNTER — Other Ambulatory Visit: Payer: Self-pay | Admitting: Otolaryngology

## 2020-03-12 DIAGNOSIS — K118 Other diseases of salivary glands: Secondary | ICD-10-CM

## 2020-03-19 ENCOUNTER — Other Ambulatory Visit: Payer: Self-pay | Admitting: Family Medicine

## 2020-03-19 ENCOUNTER — Other Ambulatory Visit: Payer: Self-pay | Admitting: Radiology

## 2020-03-19 DIAGNOSIS — Z1389 Encounter for screening for other disorder: Secondary | ICD-10-CM | POA: Diagnosis not present

## 2020-03-19 DIAGNOSIS — E2839 Other primary ovarian failure: Secondary | ICD-10-CM

## 2020-03-19 DIAGNOSIS — N182 Chronic kidney disease, stage 2 (mild): Secondary | ICD-10-CM | POA: Diagnosis not present

## 2020-03-19 DIAGNOSIS — E782 Mixed hyperlipidemia: Secondary | ICD-10-CM | POA: Diagnosis not present

## 2020-03-19 DIAGNOSIS — D509 Iron deficiency anemia, unspecified: Secondary | ICD-10-CM | POA: Diagnosis not present

## 2020-03-19 DIAGNOSIS — Z79899 Other long term (current) drug therapy: Secondary | ICD-10-CM | POA: Diagnosis not present

## 2020-03-19 DIAGNOSIS — K219 Gastro-esophageal reflux disease without esophagitis: Secondary | ICD-10-CM | POA: Diagnosis not present

## 2020-03-19 DIAGNOSIS — E1149 Type 2 diabetes mellitus with other diabetic neurological complication: Secondary | ICD-10-CM | POA: Diagnosis not present

## 2020-03-19 DIAGNOSIS — I129 Hypertensive chronic kidney disease with stage 1 through stage 4 chronic kidney disease, or unspecified chronic kidney disease: Secondary | ICD-10-CM | POA: Diagnosis not present

## 2020-03-19 DIAGNOSIS — Z1231 Encounter for screening mammogram for malignant neoplasm of breast: Secondary | ICD-10-CM

## 2020-03-19 DIAGNOSIS — F39 Unspecified mood [affective] disorder: Secondary | ICD-10-CM | POA: Diagnosis not present

## 2020-03-19 DIAGNOSIS — J309 Allergic rhinitis, unspecified: Secondary | ICD-10-CM | POA: Diagnosis not present

## 2020-03-19 DIAGNOSIS — Z0000 Encounter for general adult medical examination without abnormal findings: Secondary | ICD-10-CM | POA: Diagnosis not present

## 2020-03-19 DIAGNOSIS — E1121 Type 2 diabetes mellitus with diabetic nephropathy: Secondary | ICD-10-CM | POA: Diagnosis not present

## 2020-03-20 ENCOUNTER — Other Ambulatory Visit: Payer: Self-pay

## 2020-03-20 ENCOUNTER — Ambulatory Visit
Admission: RE | Admit: 2020-03-20 | Discharge: 2020-03-20 | Disposition: A | Discharge status: Home / Self Care | Payer: Medicare Other | Source: Ambulatory Visit | Attending: Otolaryngology | Admitting: Otolaryngology

## 2020-03-20 DIAGNOSIS — K118 Other diseases of salivary glands: Secondary | ICD-10-CM | POA: Diagnosis not present

## 2020-03-20 NOTE — Procedures (Signed)
Interventional Radiology Procedure Note  Procedure:   US guided right parotid lesion FNA.   Complications: None Recommendations:  - Ok to shower tomorrow - Do not submerge for 7 days - Routine care   Signed,  Dulcy Fanny. Earleen Newport, DO

## 2020-03-21 LAB — CYTOLOGY - NON PAP

## 2020-03-28 ENCOUNTER — Other Ambulatory Visit (HOSPITAL_COMMUNITY): Payer: Self-pay | Admitting: Otolaryngology

## 2020-03-28 ENCOUNTER — Other Ambulatory Visit: Payer: Self-pay | Admitting: Otolaryngology

## 2020-03-28 DIAGNOSIS — D37030 Neoplasm of uncertain behavior of the parotid salivary glands: Secondary | ICD-10-CM | POA: Diagnosis not present

## 2020-03-28 DIAGNOSIS — K118 Other diseases of salivary glands: Secondary | ICD-10-CM

## 2020-03-28 DIAGNOSIS — H903 Sensorineural hearing loss, bilateral: Secondary | ICD-10-CM | POA: Diagnosis not present

## 2020-05-31 DIAGNOSIS — C801 Malignant (primary) neoplasm, unspecified: Secondary | ICD-10-CM

## 2020-05-31 HISTORY — DX: Malignant (primary) neoplasm, unspecified: C80.1

## 2020-06-06 ENCOUNTER — Ambulatory Visit
Admission: RE | Admit: 2020-06-06 | Discharge: 2020-06-06 | Disposition: A | Discharge status: Home / Self Care | Payer: Medicare Other | Source: Ambulatory Visit | Attending: Family Medicine | Admitting: Family Medicine

## 2020-06-06 ENCOUNTER — Other Ambulatory Visit: Payer: Self-pay

## 2020-06-06 DIAGNOSIS — E2839 Other primary ovarian failure: Secondary | ICD-10-CM

## 2020-06-06 DIAGNOSIS — Z1231 Encounter for screening mammogram for malignant neoplasm of breast: Secondary | ICD-10-CM

## 2020-06-06 DIAGNOSIS — Z780 Asymptomatic menopausal state: Secondary | ICD-10-CM | POA: Diagnosis not present

## 2020-06-12 ENCOUNTER — Other Ambulatory Visit: Payer: Self-pay | Admitting: Family Medicine

## 2020-06-12 DIAGNOSIS — R928 Other abnormal and inconclusive findings on diagnostic imaging of breast: Secondary | ICD-10-CM

## 2020-06-20 ENCOUNTER — Other Ambulatory Visit: Payer: Self-pay | Admitting: Family Medicine

## 2020-06-20 ENCOUNTER — Other Ambulatory Visit: Payer: Self-pay

## 2020-06-20 ENCOUNTER — Ambulatory Visit
Admission: RE | Admit: 2020-06-20 | Discharge: 2020-06-20 | Disposition: A | Discharge status: Home / Self Care | Payer: Medicare Other | Source: Ambulatory Visit | Attending: Family Medicine | Admitting: Family Medicine

## 2020-06-20 DIAGNOSIS — R928 Other abnormal and inconclusive findings on diagnostic imaging of breast: Secondary | ICD-10-CM

## 2020-06-20 DIAGNOSIS — N6489 Other specified disorders of breast: Secondary | ICD-10-CM | POA: Diagnosis not present

## 2020-06-20 DIAGNOSIS — R922 Inconclusive mammogram: Secondary | ICD-10-CM | POA: Diagnosis not present

## 2020-07-02 ENCOUNTER — Ambulatory Visit
Admission: RE | Admit: 2020-07-02 | Discharge: 2020-07-02 | Disposition: A | Discharge status: Home / Self Care | Payer: Medicare Other | Source: Ambulatory Visit | Attending: Family Medicine | Admitting: Family Medicine

## 2020-07-02 ENCOUNTER — Other Ambulatory Visit: Payer: Self-pay

## 2020-07-02 DIAGNOSIS — R928 Other abnormal and inconclusive findings on diagnostic imaging of breast: Secondary | ICD-10-CM

## 2020-07-02 DIAGNOSIS — Z170 Estrogen receptor positive status [ER+]: Secondary | ICD-10-CM | POA: Diagnosis not present

## 2020-07-02 DIAGNOSIS — N6311 Unspecified lump in the right breast, upper outer quadrant: Secondary | ICD-10-CM | POA: Diagnosis not present

## 2020-07-02 DIAGNOSIS — C50411 Malignant neoplasm of upper-outer quadrant of right female breast: Secondary | ICD-10-CM | POA: Diagnosis not present

## 2020-07-05 ENCOUNTER — Ambulatory Visit
Admission: RE | Admit: 2020-07-05 | Discharge: 2020-07-05 | Disposition: A | Discharge status: Home / Self Care | Payer: Medicare Other | Source: Ambulatory Visit | Attending: Otolaryngology | Admitting: Otolaryngology

## 2020-07-05 ENCOUNTER — Other Ambulatory Visit: Payer: Self-pay

## 2020-07-05 DIAGNOSIS — K118 Other diseases of salivary glands: Secondary | ICD-10-CM | POA: Diagnosis not present

## 2020-07-05 DIAGNOSIS — R221 Localized swelling, mass and lump, neck: Secondary | ICD-10-CM | POA: Diagnosis not present

## 2020-07-10 ENCOUNTER — Encounter: Payer: Self-pay | Admitting: Adult Health

## 2020-07-10 DIAGNOSIS — Z170 Estrogen receptor positive status [ER+]: Secondary | ICD-10-CM | POA: Insufficient documentation

## 2020-07-10 DIAGNOSIS — C50411 Malignant neoplasm of upper-outer quadrant of right female breast: Secondary | ICD-10-CM | POA: Insufficient documentation

## 2020-07-12 DIAGNOSIS — C50411 Malignant neoplasm of upper-outer quadrant of right female breast: Secondary | ICD-10-CM | POA: Diagnosis not present

## 2020-07-17 ENCOUNTER — Other Ambulatory Visit: Payer: Self-pay | Admitting: General Surgery

## 2020-07-17 DIAGNOSIS — C50411 Malignant neoplasm of upper-outer quadrant of right female breast: Secondary | ICD-10-CM

## 2020-07-17 DIAGNOSIS — Z170 Estrogen receptor positive status [ER+]: Secondary | ICD-10-CM

## 2020-07-22 ENCOUNTER — Encounter: Payer: Self-pay | Admitting: Oncology

## 2020-07-22 ENCOUNTER — Ambulatory Visit
Admission: RE | Admit: 2020-07-22 | Discharge: 2020-07-22 | Disposition: A | Discharge status: Home / Self Care | Payer: Medicare Other | Source: Ambulatory Visit | Attending: Radiation Oncology | Admitting: Radiation Oncology

## 2020-07-22 ENCOUNTER — Telehealth: Payer: Self-pay | Admitting: *Deleted

## 2020-07-22 ENCOUNTER — Encounter: Payer: Self-pay | Admitting: Radiation Oncology

## 2020-07-22 ENCOUNTER — Inpatient Hospital Stay: Payer: Medicare Other | Attending: Oncology | Admitting: Oncology

## 2020-07-22 ENCOUNTER — Inpatient Hospital Stay: Payer: Medicare Other

## 2020-07-22 VITALS — BP 163/95 | HR 81 | Temp 96.5°F | Resp 18 | Wt 154.0 lb

## 2020-07-22 VITALS — BP 163/95 | HR 81 | Temp 96.5°F | Wt 154.0 lb

## 2020-07-22 DIAGNOSIS — I10 Essential (primary) hypertension: Secondary | ICD-10-CM | POA: Insufficient documentation

## 2020-07-22 DIAGNOSIS — Z7984 Long term (current) use of oral hypoglycemic drugs: Secondary | ICD-10-CM

## 2020-07-22 DIAGNOSIS — Z87891 Personal history of nicotine dependence: Secondary | ICD-10-CM

## 2020-07-22 DIAGNOSIS — E119 Type 2 diabetes mellitus without complications: Secondary | ICD-10-CM | POA: Diagnosis not present

## 2020-07-22 DIAGNOSIS — Z79899 Other long term (current) drug therapy: Secondary | ICD-10-CM | POA: Insufficient documentation

## 2020-07-22 DIAGNOSIS — C50411 Malignant neoplasm of upper-outer quadrant of right female breast: Secondary | ICD-10-CM

## 2020-07-22 DIAGNOSIS — Z7189 Other specified counseling: Secondary | ICD-10-CM

## 2020-07-22 DIAGNOSIS — Z170 Estrogen receptor positive status [ER+]: Secondary | ICD-10-CM | POA: Diagnosis not present

## 2020-07-22 NOTE — Consult Note (Signed)
NEW PATIENT EVALUATION  Name: Theresa Ferguson  MRN: [id number]  Date:   07/22/2020     DOB: [birth year]   This 74 y.o. 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  REFERRING PHYSICIAN: Kelton Pillar, MD  CHIEF COMPLAINT:  Chief Complaint  Patient presents with  . Follow-up    DIAGNOSIS: There were no encounter diagnoses.   PREVIOUS INVESTIGATIONS:  Pathology report reviewed Mammogram and ultrasound reviewed Clinical notes reviewed  HPI: Patient is a 74 year old female who presented with an abnormal mammogram of the right breast showing a 1.3 x 1.2 cm lesion 9:30 position 12 cm from the nipple corresponding to an area of focal palpable  thickening. This was confirmed on ultrasound underwent targeted ultrasound which was positive for invasive mammary carcinoma with carcinoma in situ also present. Tumor was ER/PR positive HER-2/neu not overexpressed. Patient is being scheduled for surgery by Dr. Donne Hazel and is now referred to radiation oncology and medical oncology for opinion. She is doing well and is without complaints. She specifically denies breast tenderness cough bone pain. She did not palpate any mass in her breast.  PLANNED TREATMENT REGIMEN: Wide local excision followed by whole breast radiation  PAST MEDICAL HISTORY:  has a past medical history of CTS (carpal tunnel syndrome), Hyperlipemia, Hypertension, Neuropathy in diabetes ([place]) (02/02/2013), Radiculopathy of lumbosacral region (02/02/2013), and RLS (restless legs syndrome) (02/05/2015).    PAST SURGICAL HISTORY:  Past Surgical History:  Procedure Laterality Date  . FINGER SURGERY Right 1998   [facility]  . kidney stones  1980  . KNEE SURGERY Bilateral    partial right-2008,left full replacement-2006-Dr [facility]  . LAMINECTOMY     l4-5  . LUMBAR DISC SURGERY      FAMILY HISTORY: family history includes  Diabetes in her son.  SOCIAL HISTORY:  reports that she has quit smoking. She has never used smokeless tobacco. She reports current alcohol use. She reports that she does not use drugs.  ALLERGIES: Codeine, Latex, Morphine and related, Nickel, and Penicillins  MEDICATIONS:  Current Outpatient Medications  Medication Sig Dispense Refill  . celecoxib (CELEBREX) 200 MG capsule Take 200 mg by mouth daily.    Marland Kitchen escitalopram (LEXAPRO) 10 MG tablet Take 10 mg by mouth daily.    . fluticasone (FLONASE) 50 MCG/ACT nasal spray Place 1 spray into both nostrils as needed.     . gabapentin (NEURONTIN) 800 MG tablet Take one by mouth at morning . Lunch and 2 at night. 360 tablet 3  . lisinopril-hydrochlorothiazide (PRINZIDE,ZESTORETIC) 20-12.5 MG per tablet Take 1 tablet by mouth daily.    . metFORMIN (GLUCOPHAGE) 500 MG tablet Take 500 mg by mouth 2 (two) times daily. At bedtime    . Multiple Vitamin (MULTIVITAMIN) capsule Take 1 capsule by mouth daily.    . NONFORMULARY OR COMPOUNDED ITEM Amantadine 8%, Baclofen 2%, gabapentin 6%, amitriptyline 4%, bupivacaine 2%, clonidine 2% 1 each 11  . pantoprazole (PROTONIX) 40 MG tablet Take 40 mg by mouth daily.    . rosuvastatin (CRESTOR) 10 MG tablet Take 10 mg by mouth daily.    Marland Kitchen aspirin 81 MG tablet Take 81 mg by mouth daily. (Patient not taking: Reported on 07/22/2020)    . ferrous sulfate ([employer] IRON) 325 (65 FE) MG tablet Take 325 mg by mouth daily with breakfast. (Patient not taking: Reported on 07/22/2020)    . magnesium oxide (MAG-OX) 400 MG tablet Take  by mouth. (Patient not taking: Reported on 07/22/2020)     No current facility-administered medications for this encounter.    ECOG PERFORMANCE STATUS:  0 - Asymptomatic  REVIEW OF SYSTEMS: Patient denies any weight loss, fatigue, weakness, fever, chills or night sweats. Patient denies any loss of vision, blurred vision. Patient denies any ringing  of the ears or hearing loss. No irregular heartbeat.  Patient denies heart murmur or history of fainting. Patient denies any chest pain or pain radiating to her upper extremities. Patient denies any shortness of breath, difficulty breathing at night, cough or hemoptysis. Patient denies any swelling in the lower legs. Patient denies any nausea vomiting, vomiting of blood, or coffee ground material in the vomitus. Patient denies any stomach pain. Patient states has had normal bowel movements no significant constipation or diarrhea. Patient denies any dysuria, hematuria or significant nocturia. Patient denies any problems walking, swelling in the joints or loss of balance. Patient denies any skin changes, loss of hair or loss of weight. Patient denies any excessive worrying or anxiety or significant depression. Patient denies any problems with insomnia. Patient denies excessive thirst, polyuria, polydipsia. Patient denies any swollen glands, patient denies easy bruising or easy bleeding. Patient denies any recent infections, allergies or URI. Patient "s visual fields have not changed significantly in recent time.   PHYSICAL EXAM: BP (!) 163/95 (BP Location: Left Arm, Patient Position: Sitting, Cuff Size: Normal)   Pulse 81   Temp (!) 96.5 F (35.8 C) (Tympanic)   Wt 154 lb (69.9 kg)   BMI 25.63 kg/m  No dominant mass or nodularity is noted in either breast. No axillary or supraclavicular adenopathy is appreciated. Well-developed well-nourished patient in NAD. HEENT reveals PERLA, EOMI, discs not visualized.  Oral cavity is clear. No oral mucosal lesions are identified. Neck is clear without evidence of cervical or supraclavicular adenopathy. Lungs are clear to A&P. Cardiac examination is essentially unremarkable with regular rate and rhythm without murmur rub or thrill. Abdomen is benign with no organomegaly or masses noted. Motor sensory and DTR levels are equal and symmetric in the upper and lower extremities. Cranial nerves II through XII are grossly intact.  Proprioception is intact. No peripheral adenopathy or edema is identified. No motor or sensory levels are noted. Crude visual fields are within normal range.  LABORATORY DATA: Pathology report reviewed    RADIOLOGY RESULTS: Mammogram ultrasound reviewed compatible with above-stated findings   IMPRESSION: Probable stage I invasive mammary carcinoma of the right breast in 74 year old female ER/PR positive HER-2/neu not overexpressed  PLAN: This time we had a discussion with the surgeon who based on the choose wisely clinical directives states that sentinel lymph node biopsy may be avoided in older patients with smaller lesions and no palpable axillary adenopathy. This has been reviewed with the patient and she is acceptable to going ahead with wide local excision. Based on those findings Dr. Janese Banks will probably run Oncotype DX. Should she not be a candidate for systemic chemotherapy would go ahead with hypofractionated course of whole breast radiation. Risks and benefits of treatment including skin reaction fatigue alteration of blood counts possible inclusion of superficial lung all were discussed in detail with the patient. I have set her up for follow-up after completion of her surgery would make further recommendations at that time.  I would like to take this opportunity to thank you for allowing me to participate in the care of your patient.Noreene Filbert, MD

## 2020-07-22 NOTE — Telephone Encounter (Signed)
error 

## 2020-07-22 NOTE — Progress Notes (Signed)
Hematology/Oncology Consult note [facility] Telephone:([phone] Fax:([phone]  Patient Care Team: Kelton Pillar, MD as PCP - General (Family Medicine)   Name of the patient: Theresa Ferguson  [id number]  01/22/1946    Reason for referral-new diagnosis of breast cancer   Referring physician-Dr. Rolm Bookbinder  Date of visit: 07/22/20   History of presenting illness- Patient is a 74 year old female who underwent routine bilateral screening mammogram in October 2021 which showed a possible distortion in the right breast.  This was followed by diagnostic mammogram and ultrasound which showed a 1.1 x 1.3 x 1.2 cm mass at the 9:30 position in the right breast.  No adenopathy noted in the right axilla.  Ultrasound-guided biopsy showed grade 1 ER PR positive HER-2 negative tumor with a Ki-67 of 10%.  Patient has met with Dr. Donne Hazel and plan is for a lumpectomy.  Based on choosing wisely guidelines sentinel lymph node biopsy was deemed to be not necessary.  Patient is doing well for her age and denies any complaints at this time.  She is G2 P2 L2. With no prior history of breast cancer or abnormal breast biopsies.  No family history of: Pancreatic cancer.  History of breast cancer in her first cousin in her 37s.  No use of hormone replacement therapy  ECOG PS- 1  Pain scale- 0   Review of systems- Review of Systems  Constitutional: Negative for chills, fever, malaise/fatigue and weight loss.  HENT: Negative for congestion, ear discharge and nosebleeds.   Eyes: Negative for blurred vision.  Respiratory: Negative for cough, hemoptysis, sputum production, shortness of breath and wheezing.   Cardiovascular: Negative for chest pain, palpitations, orthopnea and claudication.  Gastrointestinal: Negative for abdominal pain, blood in stool, constipation, diarrhea, heartburn, melena, nausea and vomiting.  Genitourinary: Negative for dysuria, flank pain, frequency,  hematuria and urgency.  Musculoskeletal: Negative for back pain, joint pain and myalgias.  Skin: Negative for rash.  Neurological: Negative for dizziness, tingling, focal weakness, seizures, weakness and headaches.  Endo/Heme/Allergies: Does not bruise/bleed easily.  Psychiatric/Behavioral: Negative for depression and suicidal ideas. The patient does not have insomnia.     Allergies  Allergen Reactions  . Codeine   . Latex   . Morphine And Related   . Nickel   . Penicillins     Patient Active Problem List   Diagnosis Date Noted  . Malignant neoplasm of upper-outer quadrant of right breast in female, estrogen receptor positive ([place]) 07/10/2020  . Other seasonal allergic rhinitis 05/13/2015  . Restless legs syndrome (RLS) 05/13/2015  . Diabetic polyneuropathy associated with diabetes mellitus due to underlying condition ([place]) 05/13/2015  . RLS (restless legs syndrome) 02/05/2015  . Neuropathy 02/05/2015  . DDD (degenerative disc disease), lumbar 02/02/2014  . Neuropathy in diabetes ([place]) 02/02/2013  . Radiculopathy of lumbosacral region 02/02/2013     Past Medical History:  Diagnosis Date  . CTS (carpal tunnel syndrome)    bil, by [facility]  . Hyperlipemia   . Hypertension   . Neuropathy in diabetes ([place]) 02/02/2013  . Radiculopathy of lumbosacral region 02/02/2013  . RLS (restless legs syndrome) 02/05/2015     Past Surgical History:  Procedure Laterality Date  . FINGER SURGERY Right 1998   [facility]  . kidney stones  1980  . KNEE SURGERY Bilateral    partial right-2008,left full replacement-2006-Dr [facility]  . LAMINECTOMY     l4-5  . LUMBAR DISC SURGERY      Social History   Socioeconomic  History  . Marital status: Married    Spouse name: Jeneen Rinks  . Number of children: 2  . Years of education: College  . Highest education level: Not on file  Occupational History  . 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 Smoking status: Former Research scientist (life sciences)  . Smokeless tobacco: Never Used  Substance and Sexual Activity  . Alcohol use: Yes    Comment: socially  . Drug use: No  . Sexual activity: Not on file  Other Topics Concern  . Not on file  Social History Narrative   Patient is married Jeneen Rinks) and lives at home with her husband.   Patient has two adult children.   Patient is retired.   Patient has a college education.   Patient is right-handed.   Patient drinks two cups of coffee daily.   Social Determinants of Health   Financial Resource Strain:   . Difficulty of Paying Living Expenses: Not on file  Food Insecurity:   . Worried About Charity fundraiser in the Last Year: Not on file  . Ran Out of Food in the Last Year: Not on file  Transportation Needs:   . Lack of Transportation (Medical): Not on file  . Lack of Transportation (Non-Medical): Not on file  Physical Activity:   . Days of Exercise per Week: Not on file  . Minutes of Exercise per Session: Not on file  Stress:   . Feeling of Stress : Not on file  Social Connections:   . Frequency of Communication with Friends and Family: Not on file  . Frequency of Social Gatherings with Friends and Family: Not on file  . Attends Religious Services: Not on file  . Active Member of Clubs or Organizations: Not on file  . Attends Archivist Meetings: Not on file  . Marital Status: Not on file  Intimate Partner Violence:   . Fear of Current or Ex-Partner: Not on file  . Emotionally Abused: Not on file  . Physically Abused: Not on file  . Sexually Abused: Not on file     Family History  Problem Relation Age of Onset  . Diabetes Son   . Breast cancer Neg Hx      Current Outpatient Medications:  .  aspirin 81 MG tablet, Take 81 mg by mouth daily. (Patient not taking: Reported on 07/22/2020), Disp: , Rfl:  .  celecoxib (CELEBREX) 200 MG capsule, Take 200 mg by mouth daily., Disp: , Rfl:  .  escitalopram (LEXAPRO) 10 MG  tablet, Take 10 mg by mouth daily., Disp: , Rfl:  .  ferrous sulfate ([employer] IRON) 325 (65 FE) MG tablet, Take 325 mg by mouth daily with breakfast. (Patient not taking: Reported on 07/22/2020), Disp: , Rfl:  .  fluticasone (FLONASE) 50 MCG/ACT nasal spray, Place 1 spray into both nostrils as needed. , Disp: , Rfl:  .  gabapentin (NEURONTIN) 800 MG tablet, Take one by mouth at morning . Lunch and 2 at night., Disp: 360 tablet, Rfl: 3 .  lisinopril-hydrochlorothiazide (PRINZIDE,ZESTORETIC) 20-12.5 MG per tablet, Take 1 tablet by mouth daily., Disp: , Rfl:  .  magnesium oxide (MAG-OX) 400 MG tablet, Take by mouth. (Patient not taking: Reported on 07/22/2020), Disp: , Rfl:  .  metFORMIN (GLUCOPHAGE) 500 MG tablet, Take 500 mg by mouth 2 (two) times daily. At bedtime, Disp: , Rfl:  .  Multiple Vitamin (MULTIVITAMIN) capsule, Take 1  capsule by mouth daily., Disp: , Rfl:  .  NONFORMULARY OR COMPOUNDED ITEM, Amantadine 8%, Baclofen 2%, gabapentin 6%, amitriptyline 4%, bupivacaine 2%, clonidine 2%, Disp: 1 each, Rfl: 11 .  pantoprazole (PROTONIX) 40 MG tablet, Take 40 mg by mouth daily., Disp: , Rfl:  .  rosuvastatin (CRESTOR) 10 MG tablet, Take 10 mg by mouth daily., Disp: , Rfl:    Physical exam:  Vitals:   07/22/20 0943  BP: (!) 163/95  Pulse: 81  Resp: 18  Temp: (!) 96.5 F (35.8 C)  SpO2: 100%  Weight: 154 lb (69.9 kg)   Physical Exam Constitutional:      General: She is not in acute distress. Cardiovascular:     Rate and Rhythm: Normal rate and regular rhythm.     Heart sounds: Normal heart sounds.  Pulmonary:     Effort: Pulmonary effort is normal.     Breath sounds: Normal breath sounds.  Abdominal:     General: Bowel sounds are normal.     Palpations: Abdomen is soft.  Skin:    General: Skin is warm and dry.  Neurological:     Mental Status: She is alert and oriented to person, place, and time.     No palpable masses in the right breast.  No palpable right axillary adenopathy.   No palpable masses or axillary adenopathy in the left breast.   CMP Latest Ref Rng & Units 12/19/2019  Creatinine 0.44 - 1.00 mg/dL 0.80  Potassium 3.5 - 5.1 mmol/L -   No flowsheet data found.  No images are attached to the encounter.  US SOFT TISSUE HEAD & NECK (NON-THYROID)  Result Date: 07/05/2020 CLINICAL DATA:  Right parotid mass, biopsied in 02/2020. EXAM: ULTRASOUND OF HEAD/NECK SOFT TISSUES TECHNIQUE: Ultrasound examination of the head and neck soft tissues was performed in the area of clinical concern. COMPARISON:  03/07/2020 neck ultrasound FINDINGS: A hypoechoic right parotid mass measures 15 x 7 x 9 mm and is unchanged in size from the prior ultrasound. No new right parotid gland abnormality is identified. IMPRESSION: Unchanged size of previously biopsied right parotid mass. Electronically Signed   By: Logan Bores M.D.   On: 07/05/2020 14:29   MM CLIP PLACEMENT RIGHT  Result Date: 07/02/2020 CLINICAL DATA:  74 year old female status post ultrasound-guided biopsy of the right breast. EXAM: DIAGNOSTIC RIGHT MAMMOGRAM POST ULTRASOUND BIOPSY COMPARISON:  Previous exam(s). FINDINGS: Mammographic images were obtained following ultrasound guided biopsy of . The biopsy marking clip is in expected position at the site of biopsy. IMPRESSION: Appropriate positioning of the ribbon shaped biopsy marking clip at the site of biopsy in the lateral right breast. Final Assessment: Post Procedure Mammograms for Marker Placement Electronically Signed   By: Kristopher Oppenheim M.D.   On: 07/02/2020 10:12   [place] RT BREAST BX W LOC DEV 1ST LESION IMG BX SPEC US GUIDE  Addendum Date: 07/05/2020   ADDENDUM REPORT: 07/04/2020 13:06 ADDENDUM: Pathology revealed GRADE I INVASIVE MAMMARY CARCINOMA, MAMMARY CARCINOMA IN SITU of the RIGHT breast, 9:30 o'clock. E-cadherin is POSITIVE supporting a ductal origin. This was found to be concordant by Dr. Kristopher Oppenheim. Pathology results were discussed with the patient by  telephone. The patient reported doing well after the biopsy with tenderness at the site. Post biopsy instructions and care were reviewed and questions were answered. The patient was encouraged to call [facility] for any additional concerns. Surgical consultation has been arranged with Dr. Rolm Bookbinder at [facility]gery on July 12, 2020. Recommend breast MRI given patient's breast density. Pathology results reported by Stacie Acres RN on 07/04/2020. Electronically Signed   By: Kristopher Oppenheim M.D.   On: 07/04/2020 13:06   Result Date: 07/05/2020 CLINICAL DATA:  74 year old female with a suspicious right breast mass. EXAM: ULTRASOUND GUIDED RIGHT BREAST CORE NEEDLE BIOPSY COMPARISON:  Previous exam(s). PROCEDURE: I met with the patient and we discussed the procedure of ultrasound-guided biopsy, including benefits and alternatives. We discussed the high likelihood of a successful procedure. We discussed the risks of the procedure, including infection, bleeding, tissue injury, clip migration, and inadequate sampling. Informed written consent was given. The usual time-out protocol was performed immediately prior to the procedure. Lesion quadrant: Upper outer quadrant Using sterile technique and 1% Lidocaine as local anesthetic, under direct ultrasound visualization, a 12 gauge spring-loaded device was used to perform biopsy of mass at the 9:30 position of the right breast using a lateral approach. At the conclusion of the procedure ribbon shaped tissue marker clip was deployed into the biopsy cavity. Follow up 2 view mammogram was performed and dictated separately. IMPRESSION: Ultrasound guided biopsy of right breast. No apparent complications. Electronically Signed: By: Kristopher Oppenheim M.D. On: 07/02/2020 10:07    Assessment and plan- Patient is a 74 y.o. female with newly diagnosed invasive mammary carcinoma of the right breast clinical prognostic stage I acT1 ccN0 cM0  ER/PR positive HER-2 negative here to discuss further management  I discussed with the patient the results of her mammogram which showed a 1.1 cm Tumor that was grade 1 ER 95% positive and PR 95% positive with HER-2 negative.  Based on choosing wisely guidelines sentinel lymph node biopsy could be omitted.  I have discussed her case with Dr. Donne Hazel and Dr. Baruch Gouty.  Sentinel lymph node biopsy would not change medical oncology management but the question was will change radiation recommendations.  Based on choosing wisely guidelines omitting sentinel lymph node biopsy in patients with T1c grade 1 tumors has not been shown to affect overall survival.  Plan is therefore to committed at this time  Patient can proceed with upfront lumpectomy and I will see her following the surgery for a video visit to discuss final pathology results and further management.  If she has a tumor more than 1 cm that was grade 1 or more than 0.5 cm grade 2 or higher I would be sending Oncotype testing.  Discussed for Oncotype testing is and how the results are interpreted.  At her age if her score is less than or equal to 25 she would not benefit from adjuvant chemotherapy.  Chemotherapy could be considered for score of 26 or higher.  Treatment will be given with a curative intent.  Patient's tumor is ER/PR positive and therefore she would be a candidate for hormone therapy for 5 years.  Discussed risks and benefits of both tamoxifen and AI's and I would recommend adjuvant AI treatment for 5 years.  Discussed risks and benefits of AI including all but not limited to fatigue, mood swings, hot flashes, arthralgias and worsening bone health.  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  I would recommend prophylactic calcium and vitamin D for this.  She does not require bisphosphonates at this time.  Recommend monitoring bone density every other year.  Written information about  letrozole was given to the patient.  Treatment will be given with a curative intent.  Cancer Staging Malignant neoplasm of upper-outer quadrant of right breast in female, estrogen receptor positive ([place]) Staging form: Breast, AJCC 8th Edition - Clinical stage from 07/02/2020: Stage IA (cT1c, cN0, cM0, G1, ER+, PR+, HER2-) - Signed by Gardenia Phlegm, NP on 07/10/2020     Thank you for this kind referral and the opportunity to participate in the care of this patient   Visit Diagnosis 1. Malignant neoplasm of upper-outer quadrant of right breast in female, estrogen receptor positive ([place])   2. Goals of care, counseling/discussion     Dr. Randa Evens, MD, MPH [facility] at [facility] [phone] 07/22/2020  4:42 PM

## 2020-07-31 ENCOUNTER — Inpatient Hospital Stay: Payer: Medicare Other | Attending: Oncology | Admitting: Hospice and Palliative Medicine

## 2020-07-31 ENCOUNTER — Other Ambulatory Visit: Payer: Self-pay

## 2020-07-31 DIAGNOSIS — C50411 Malignant neoplasm of upper-outer quadrant of right female breast: Secondary | ICD-10-CM

## 2020-07-31 DIAGNOSIS — Z170 Estrogen receptor positive status [ER+]: Secondary | ICD-10-CM

## 2020-07-31 NOTE — Progress Notes (Signed)
Multidisciplinary Oncology Council Documentation  Theresa Ferguson was presented by our [facility] on 07/31/2020, which included representatives from:   Palliative Care  Dietitian  Physical/Occupational Therapist  Speech Therapist  Survivorship Nurse  Nurse Navigator  Social work  Theresa Ferguson currently presents with history of stage 1 breast cancer pending lumpectomy  We reviewed previous medical and familial history, history of present illness, and recent lab results along with all available histopathologic and imaging studies. [facility] considered available treatment options and made the following recommendations/referrals: No orders of the defined types were placed in this encounter.  OT to monitor for needs Research is following  The MOC is a meeting of clinicians from various specialty areas who evaluate and discuss patients for whom a multidisciplinary approach is being considered. Final determinations in the plan of care are those of the provider(s).   Todays extended care, comprehensive team conference, Theresa Ferguson was not present for the discussion and was not examined.

## 2020-08-02 ENCOUNTER — Other Ambulatory Visit: Payer: Self-pay

## 2020-08-02 ENCOUNTER — Encounter (HOSPITAL_BASED_OUTPATIENT_CLINIC_OR_DEPARTMENT_OTHER): Payer: Self-pay | Admitting: General Surgery

## 2020-08-03 ENCOUNTER — Other Ambulatory Visit (HOSPITAL_COMMUNITY)
Admission: RE | Admit: 2020-08-03 | Discharge: 2020-08-03 | Disposition: A | Discharge status: Home / Self Care | Payer: Medicare Other | Source: Ambulatory Visit | Attending: General Surgery | Admitting: General Surgery

## 2020-08-03 DIAGNOSIS — Z20822 Contact with and (suspected) exposure to covid-19: Secondary | ICD-10-CM | POA: Diagnosis not present

## 2020-08-03 DIAGNOSIS — Z01812 Encounter for preprocedural laboratory examination: Secondary | ICD-10-CM | POA: Insufficient documentation

## 2020-08-04 LAB — SARS CORONAVIRUS 2 (TAT 6-24 HRS): SARS Coronavirus 2: NEGATIVE

## 2020-08-05 ENCOUNTER — Encounter (HOSPITAL_BASED_OUTPATIENT_CLINIC_OR_DEPARTMENT_OTHER)
Admission: RE | Admit: 2020-08-05 | Discharge: 2020-08-05 | Disposition: A | Discharge status: Home / Self Care | Payer: Medicare Other | Source: Ambulatory Visit | Attending: General Surgery | Admitting: General Surgery

## 2020-08-05 ENCOUNTER — Ambulatory Visit
Admission: RE | Admit: 2020-08-05 | Discharge: 2020-08-05 | Disposition: A | Discharge status: Home / Self Care | Payer: Medicare Other | Source: Ambulatory Visit | Attending: General Surgery | Admitting: General Surgery

## 2020-08-05 ENCOUNTER — Other Ambulatory Visit: Payer: Self-pay

## 2020-08-05 DIAGNOSIS — Z01812 Encounter for preprocedural laboratory examination: Secondary | ICD-10-CM | POA: Diagnosis not present

## 2020-08-05 DIAGNOSIS — Z170 Estrogen receptor positive status [ER+]: Secondary | ICD-10-CM

## 2020-08-05 DIAGNOSIS — C50911 Malignant neoplasm of unspecified site of right female breast: Secondary | ICD-10-CM | POA: Diagnosis not present

## 2020-08-05 DIAGNOSIS — C50411 Malignant neoplasm of upper-outer quadrant of right female breast: Secondary | ICD-10-CM

## 2020-08-05 LAB — BASIC METABOLIC PANEL
Anion gap: 7 (ref 5–15)
BUN: 19 mg/dL (ref 8–23)
CO2: 26 mmol/L (ref 22–32)
Calcium: 9.6 mg/dL (ref 8.9–10.3)
Chloride: 104 mmol/L (ref 98–111)
Creatinine, Ser: 0.84 mg/dL (ref 0.44–1.00)
GFR, Estimated: >60 mL/min (ref >60)
Glucose, Bld: 132 mg/dL — ABNORMAL HIGH (ref 70–99)
Potassium: 4.5 mmol/L (ref 3.5–5.1)
Sodium: 137 mmol/L (ref 135–145)

## 2020-08-05 MED ORDER — ENSURE PRE-SURGERY PO LIQD: 296.0000 mL | Freq: Once | ORAL | Status: DC

## 2020-08-05 NOTE — Progress Notes (Signed)

## 2020-08-06 NOTE — Anesthesia Preprocedure Evaluation (Addendum)
Anesthesia Evaluation  Patient identified by MRN, date of birth, ID band Patient awake    Reviewed: Allergy & Precautions, H&P , NPO status , Patient's Chart, lab work & pertinent test results  History of Anesthesia Complications (+) PONV  Airway Mallampati: III  TM Distance: >3 FB Neck ROM: Full    Dental no notable dental hx. (+) Teeth Intact, Dental Advisory Given   Pulmonary neg pulmonary ROS, former smoker,    Pulmonary exam normal breath sounds clear to auscultation       Cardiovascular hypertension, Pt. on medications  Rhythm:Regular Rate:Normal     Neuro/Psych Anxiety  Neuromuscular disease    GI/Hepatic Neg liver ROS, GERD  Medicated and Controlled,  Endo/Other  diabetes, Type 2, Oral Hypoglycemic Agents  Renal/GU negative Renal ROSK+ 4.5  negative genitourinary   Musculoskeletal  (+) Arthritis , Osteoarthritis,    Abdominal   Peds  Hematology  (+) Blood dyscrasia, anemia ,   Anesthesia Other Findings ALL: Latex, codeine, Morphine, PCN  Reproductive/Obstetrics negative OB ROS                            Anesthesia Physical Anesthesia Plan  ASA: II  Anesthesia Plan: General   Post-op Pain Management:    Induction: Intravenous  PONV Risk Score and Plan: 4 or greater and Treatment may vary due to age or medical condition, Ondansetron, Propofol infusion, TIVA and Dexamethasone  Airway Management Planned: LMA  Additional Equipment: None  Intra-op Plan:   Post-operative Plan: Extubation in OR  Informed Consent: I have reviewed the patients History and Physical, chart, labs and discussed the procedure including the risks, benefits and alternatives for the proposed anesthesia with the patient or authorized representative who has indicated his/her understanding and acceptance.     Dental advisory given  Plan Discussed with: CRNA and Anesthesiologist  Anesthesia Plan  Comments:        Anesthesia Quick Evaluation

## 2020-08-07 ENCOUNTER — Ambulatory Visit (HOSPITAL_BASED_OUTPATIENT_CLINIC_OR_DEPARTMENT_OTHER): Payer: Medicare Other | Admitting: Anesthesiology

## 2020-08-07 ENCOUNTER — Ambulatory Visit
Admission: RE | Admit: 2020-08-07 | Discharge: 2020-08-07 | Disposition: A | Discharge status: Home / Self Care | Payer: Medicare Other | Source: Ambulatory Visit | Attending: General Surgery | Admitting: General Surgery

## 2020-08-07 ENCOUNTER — Encounter (HOSPITAL_BASED_OUTPATIENT_CLINIC_OR_DEPARTMENT_OTHER)
Admission: RE | Disposition: A | Discharge status: Home / Self Care | Payer: Self-pay | Source: Home / Self Care | Attending: General Surgery

## 2020-08-07 ENCOUNTER — Ambulatory Visit (HOSPITAL_BASED_OUTPATIENT_CLINIC_OR_DEPARTMENT_OTHER)
Admission: RE | Admit: 2020-08-07 | Discharge: 2020-08-07 | Disposition: A | Discharge status: Home / Self Care | Payer: Medicare Other | Attending: General Surgery | Admitting: General Surgery

## 2020-08-07 ENCOUNTER — Encounter (HOSPITAL_BASED_OUTPATIENT_CLINIC_OR_DEPARTMENT_OTHER): Payer: Self-pay | Admitting: General Surgery

## 2020-08-07 ENCOUNTER — Other Ambulatory Visit: Payer: Self-pay

## 2020-08-07 DIAGNOSIS — C50411 Malignant neoplasm of upper-outer quadrant of right female breast: Secondary | ICD-10-CM

## 2020-08-07 DIAGNOSIS — Z170 Estrogen receptor positive status [ER+]: Secondary | ICD-10-CM | POA: Diagnosis not present

## 2020-08-07 DIAGNOSIS — Z87891 Personal history of nicotine dependence: Secondary | ICD-10-CM | POA: Insufficient documentation

## 2020-08-07 DIAGNOSIS — Z7984 Long term (current) use of oral hypoglycemic drugs: Secondary | ICD-10-CM | POA: Diagnosis not present

## 2020-08-07 DIAGNOSIS — I10 Essential (primary) hypertension: Secondary | ICD-10-CM | POA: Diagnosis not present

## 2020-08-07 DIAGNOSIS — Z79899 Other long term (current) drug therapy: Secondary | ICD-10-CM | POA: Diagnosis not present

## 2020-08-07 DIAGNOSIS — C50911 Malignant neoplasm of unspecified site of right female breast: Secondary | ICD-10-CM | POA: Diagnosis not present

## 2020-08-07 DIAGNOSIS — M5136 Other intervertebral disc degeneration, lumbar region: Secondary | ICD-10-CM | POA: Diagnosis not present

## 2020-08-07 DIAGNOSIS — Z791 Long term (current) use of non-steroidal anti-inflammatories (NSAID): Secondary | ICD-10-CM | POA: Insufficient documentation

## 2020-08-07 HISTORY — DX: Other complications of anesthesia, initial encounter: T88.59XA

## 2020-08-07 HISTORY — DX: Other seasonal allergic rhinitis: J30.2

## 2020-08-07 HISTORY — PX: BREAST LUMPECTOMY WITH RADIOACTIVE SEED LOCALIZATION: SHX6424

## 2020-08-07 HISTORY — DX: Gastro-esophageal reflux disease without esophagitis: K21.9

## 2020-08-07 HISTORY — DX: Anxiety disorder, unspecified: F41.9

## 2020-08-07 HISTORY — PX: BREAST LUMPECTOMY: SHX2

## 2020-08-07 HISTORY — DX: Other specified postprocedural states: R11.2

## 2020-08-07 HISTORY — DX: Anemia, unspecified: D64.9

## 2020-08-07 HISTORY — DX: Other specified postprocedural states: Z98.890

## 2020-08-07 HISTORY — DX: Unspecified osteoarthritis, unspecified site: M19.90

## 2020-08-07 LAB — GLUCOSE, CAPILLARY
Glucose-Capillary: 95 mg/dL (ref 70–99)
Glucose-Capillary: 124 mg/dL — ABNORMAL HIGH (ref 70–99)

## 2020-08-07 SURGERY — BREAST LUMPECTOMY WITH RADIOACTIVE SEED LOCALIZATION
Anesthesia: General | Site: Breast | Laterality: Right

## 2020-08-07 MED ORDER — LIDOCAINE 2% (20 MG/ML) 5 ML SYRINGE
INTRAMUSCULAR | Status: AC
  Filled 2020-08-07: qty 5

## 2020-08-07 MED ORDER — BUPIVACAINE HCL (PF) 0.25 % IJ SOLN
INTRAMUSCULAR | Status: DC | PRN
  Administered 2020-08-07: 10 mL

## 2020-08-07 MED ORDER — FENTANYL CITRATE (PF) 100 MCG/2ML IJ SOLN: 25.0000 ug | INTRAMUSCULAR | Status: DC | PRN

## 2020-08-07 MED ORDER — CIPROFLOXACIN IN D5W 400 MG/200ML IV SOLN
400.0000 mg | INTRAVENOUS | Status: AC
  Administered 2020-08-07: 400 mg via INTRAVENOUS

## 2020-08-07 MED ORDER — CIPROFLOXACIN IN D5W 400 MG/200ML IV SOLN
INTRAVENOUS | Status: AC
  Filled 2020-08-07: qty 200

## 2020-08-07 MED ORDER — FENTANYL CITRATE (PF) 100 MCG/2ML IJ SOLN
INTRAMUSCULAR | Status: AC
  Filled 2020-08-07: qty 2

## 2020-08-07 MED ORDER — MIDAZOLAM HCL 2 MG/2ML IJ SOLN
INTRAMUSCULAR | Status: DC | PRN
  Administered 2020-08-07: 2 mg via INTRAVENOUS

## 2020-08-07 MED ORDER — PROPOFOL 10 MG/ML IV BOLUS
INTRAVENOUS | Status: DC | PRN
  Administered 2020-08-07: 80 mg via INTRAVENOUS

## 2020-08-07 MED ORDER — ONDANSETRON HCL 4 MG/2ML IJ SOLN
INTRAMUSCULAR | Status: DC | PRN
  Administered 2020-08-07: 4 mg via INTRAVENOUS

## 2020-08-07 MED ORDER — ACETAMINOPHEN 500 MG PO TABS
1000.0000 mg | ORAL_TABLET | ORAL | Status: AC
  Administered 2020-08-07: 1000 mg via ORAL

## 2020-08-07 MED ORDER — PROPOFOL 500 MG/50ML IV EMUL
INTRAVENOUS | Status: DC | PRN
  Administered 2020-08-07: 125 ug/kg/min via INTRAVENOUS

## 2020-08-07 MED ORDER — ONDANSETRON HCL 4 MG/2ML IJ SOLN
INTRAMUSCULAR | Status: AC
  Filled 2020-08-07: qty 2

## 2020-08-07 MED ORDER — PHENYLEPHRINE 40 MCG/ML (10ML) SYRINGE FOR IV PUSH (FOR BLOOD PRESSURE SUPPORT)
PREFILLED_SYRINGE | INTRAVENOUS | Status: AC
  Filled 2020-08-07: qty 10

## 2020-08-07 MED ORDER — ACETAMINOPHEN 10 MG/ML IV SOLN: 1000.0000 mg | Freq: Once | INTRAVENOUS | Status: DC | PRN

## 2020-08-07 MED ORDER — PHENYLEPHRINE HCL (PRESSORS) 10 MG/ML IV SOLN
INTRAVENOUS | Status: DC | PRN
  Administered 2020-08-07 (×2): 80 ug via INTRAVENOUS

## 2020-08-07 MED ORDER — LACTATED RINGERS IV SOLN: INTRAVENOUS | Status: DC

## 2020-08-07 MED ORDER — ONDANSETRON HCL 4 MG/2ML IJ SOLN: 4.0000 mg | Freq: Once | INTRAMUSCULAR | Status: DC | PRN

## 2020-08-07 MED ORDER — DEXAMETHASONE SODIUM PHOSPHATE 10 MG/ML IJ SOLN
INTRAMUSCULAR | Status: DC | PRN
  Administered 2020-08-07: 8 mg via INTRAVENOUS

## 2020-08-07 MED ORDER — DEXAMETHASONE SODIUM PHOSPHATE 10 MG/ML IJ SOLN
INTRAMUSCULAR | Status: AC
  Filled 2020-08-07: qty 1

## 2020-08-07 MED ORDER — ACETAMINOPHEN 500 MG PO TABS
ORAL_TABLET | ORAL | Status: AC
  Filled 2020-08-07: qty 2

## 2020-08-07 MED ORDER — FENTANYL CITRATE (PF) 250 MCG/5ML IJ SOLN
INTRAMUSCULAR | Status: DC | PRN
  Administered 2020-08-07: 50 ug via INTRAVENOUS
  Administered 2020-08-07 (×2): 25 ug via INTRAVENOUS

## 2020-08-07 MED ORDER — MIDAZOLAM HCL 2 MG/2ML IJ SOLN
INTRAMUSCULAR | Status: AC
  Filled 2020-08-07: qty 2

## 2020-08-07 MED ORDER — LIDOCAINE HCL (CARDIAC) PF 100 MG/5ML IV SOSY
PREFILLED_SYRINGE | INTRAVENOUS | Status: DC | PRN
  Administered 2020-08-07: 60 mg via INTRATRACHEAL

## 2020-08-07 SURGICAL SUPPLY — 56 items
APPLIER CLIP 9.375 MED OPEN (MISCELLANEOUS)
BINDER BREAST LRG (GAUZE/BANDAGES/DRESSINGS) ×2 IMPLANT
BINDER BREAST MEDIUM (GAUZE/BANDAGES/DRESSINGS) IMPLANT
BINDER BREAST XLRG (GAUZE/BANDAGES/DRESSINGS) IMPLANT
BINDER BREAST XXLRG (GAUZE/BANDAGES/DRESSINGS) IMPLANT
BLADE SURG 15 STRL LF DISP TIS (BLADE) ×1 IMPLANT
BLADE SURG 15 STRL SS (BLADE) ×2
CANISTER SUC SOCK COL 7IN (MISCELLANEOUS) IMPLANT
CANISTER SUCT 1200ML W/VALVE (MISCELLANEOUS) IMPLANT
CHLORAPREP W/TINT 26 (MISCELLANEOUS) ×2 IMPLANT
CLIP APPLIE 9.375 MED OPEN (MISCELLANEOUS) IMPLANT
CLIP VESOCCLUDE SM WIDE 6/CT (CLIP) ×2 IMPLANT
COVER BACK TABLE 60X90IN (DRAPES) ×2 IMPLANT
COVER MAYO STAND STRL (DRAPES) ×2 IMPLANT
COVER PROBE W GEL 5X96 (DRAPES) IMPLANT
COVER WAND RF STERILE (DRAPES) IMPLANT
DECANTER SPIKE VIAL GLASS SM (MISCELLANEOUS) IMPLANT
DERMABOND ADVANCED (GAUZE/BANDAGES/DRESSINGS) ×1
DERMABOND ADVANCED .7 DNX12 (GAUZE/BANDAGES/DRESSINGS) ×1 IMPLANT
DRAPE GAMMA PROBE CRDLSS 10X38 (DRAPES) ×2 IMPLANT
DRAPE LAPAROSCOPIC ABDOMINAL (DRAPES) ×2 IMPLANT
DRAPE UTILITY XL STRL (DRAPES) ×2 IMPLANT
DRSG TEGADERM 4X4.75 (GAUZE/BANDAGES/DRESSINGS) IMPLANT
ELECT COATED BLADE 2.86 ST (ELECTRODE) ×2 IMPLANT
ELECT REM PT RETURN 9FT ADLT (ELECTROSURGICAL) ×2
ELECTRODE REM PT RTRN 9FT ADLT (ELECTROSURGICAL) ×1 IMPLANT
GAUZE SPONGE 4X4 12PLY STRL LF (GAUZE/BANDAGES/DRESSINGS) IMPLANT
GLOVE BIO SURGEON STRL SZ7 (GLOVE) IMPLANT
GLOVE BIOGEL PI IND STRL 7.5 (GLOVE) ×2 IMPLANT
GLOVE BIOGEL PI INDICATOR 7.5 (GLOVE) ×2
GLOVE SURG SS PI 7.0 STRL IVOR (GLOVE) ×6 IMPLANT
GOWN STRL REUS W/ TWL LRG LVL3 (GOWN DISPOSABLE) ×2 IMPLANT
GOWN STRL REUS W/TWL LRG LVL3 (GOWN DISPOSABLE) ×4
HEMOSTAT ARISTA ABSORB 3G PWDR (HEMOSTASIS) IMPLANT
KIT MARKER MARGIN INK (KITS) ×2 IMPLANT
NEEDLE HYPO 25X1 1.5 SAFETY (NEEDLE) ×2 IMPLANT
NS IRRIG 1000ML POUR BTL (IV SOLUTION) IMPLANT
PACK BASIN DAY SURGERY FS (CUSTOM PROCEDURE TRAY) ×2 IMPLANT
PENCIL SMOKE EVACUATOR (MISCELLANEOUS) ×2 IMPLANT
RETRACTOR ONETRAX LX 90X20 (MISCELLANEOUS) IMPLANT
SLEEVE SCD COMPRESS KNEE MED (MISCELLANEOUS) ×2 IMPLANT
SPONGE LAP 4X18 RFD (DISPOSABLE) ×2 IMPLANT
STRIP CLOSURE SKIN 1/2X4 (GAUZE/BANDAGES/DRESSINGS) ×2 IMPLANT
SUT MNCRL AB 4-0 PS2 18 (SUTURE) ×2 IMPLANT
SUT MON AB 5-0 PS2 18 (SUTURE) IMPLANT
SUT SILK 2 0 SH (SUTURE) ×2 IMPLANT
SUT VIC AB 2-0 SH 27 (SUTURE) ×2
SUT VIC AB 2-0 SH 27XBRD (SUTURE) ×1 IMPLANT
SUT VIC AB 3-0 SH 27 (SUTURE) ×2
SUT VIC AB 3-0 SH 27X BRD (SUTURE) ×1 IMPLANT
SUT VIC AB 5-0 PS2 18 (SUTURE) IMPLANT
SYR CONTROL 10ML LL (SYRINGE) ×2 IMPLANT
TOWEL GREEN STERILE FF (TOWEL DISPOSABLE) ×2 IMPLANT
TRAY FAXITRON CT DISP (TRAY / TRAY PROCEDURE) ×2 IMPLANT
TUBE CONNECTING 20X1/4 (TUBING) IMPLANT
YANKAUER SUCT BULB TIP NO VENT (SUCTIONS) IMPLANT

## 2020-08-07 NOTE — Discharge Instructions (Signed)
[place] Office Phone Number [phone]  BREAST BIOPSY/ PARTIAL MASTECTOMY: POST OP INSTRUCTIONS Take 400 mg of ibuprofen every 8 hours or 650 mg tylenol every 6 hours for next 72 hours then as needed. Use ice several times daily also. Always review your discharge instruction sheet given to you by the facility where your surgery was performed.  IF YOU HAVE DISABILITY OR FAMILY LEAVE FORMS, YOU MUST BRING THEM TO THE OFFICE FOR PROCESSING.  DO NOT GIVE THEM TO YOUR DOCTOR.  1. A prescription for pain medication may be given to you upon discharge.  Take your pain medication as prescribed, if needed.  If narcotic pain medicine is not needed, then you may take acetaminophen (Tylenol), naprosyn (Alleve) or ibuprofen (Advil) as needed. NO TYLENOL BEFORE 2:30pm TODAY, IF NEEDED. 2. Take your usually prescribed medications unless otherwise directed 3. If you need a refill on your pain medication, please contact your pharmacy.  They will contact our office to request authorization.  Prescriptions will not be filled after 5pm or on week-ends. 4. You should eat very light the first 24 hours after surgery, such as soup, crackers, pudding, etc.  Resume your normal diet the day after surgery. 5. Most patients will experience some swelling and bruising in the breast.  Ice packs and a good support bra will help.  Wear the breast binder provided or a sports bra for 72 hours day and night.  After that wear a sports bra during the day until you return to the office. Swelling and bruising can take several days to resolve.  6. It is common to experience some constipation if taking pain medication after surgery.  Increasing fluid intake and taking a stool softener will usually help or prevent this problem from occurring.  A mild laxative (Milk of Magnesia or Miralax) should be taken according to package directions if there are no bowel movements after 48 hours. 7. Unless discharge instructions indicate  otherwise, you may remove your bandages 48 hours after surgery and you may shower at that time.  You may have steri-strips (small skin tapes) in place directly over the incision.  These strips should be left on the skin for 7-10 days and will come off on their own.  If your surgeon used skin glue on the incision, you may shower in 24 hours.  The glue will flake off over the next 2-3 weeks.  Any sutures or staples will be removed at the office during your follow-up visit. 8. ACTIVITIES:  You may resume regular daily activities (gradually increasing) beginning the next day.  Wearing a good support bra or sports bra minimizes pain and swelling.  You may have sexual intercourse when it is comfortable. a. You may drive when you no longer are taking prescription pain medication, you can comfortably wear a seatbelt, and you can safely maneuver your car and apply brakes. b. RETURN TO WORK:  ______________________________________________________________________________________ 9. You should see your doctor in the office for a follow-up appointment approximately two weeks after your surgery.  Your doctor's nurse will typically make your follow-up appointment when she calls you with your pathology report.  Expect your pathology report 3-4 business days after your surgery.  You may call to check if you do not hear from [place] after three days. 10. OTHER INSTRUCTIONS: _______________________________________________________________________________________________ _____________________________________________________________________________________________________________________________________ _____________________________________________________________________________________________________________________________________ _____________________________________________________________________________________________________________________________________  WHEN TO CALL DR WAKEFIELD: 1. Fever over 101.0 2. Nausea and/or  vomiting. 3. Extreme swelling or bruising. 4. Continued bleeding from incision. 5. Increased pain, redness, or  drainage from the incision.  The clinic staff is available to answer your questions during regular business hours.  Please don't hesitate to call and ask to speak to one of the nurses for clinical concerns.  If you have a medical emergency, go to the nearest emergency room or call 911.  A surgeon from [facility] is always on call at the hospital.  For further questions, please visit [email or web address]   Post Anesthesia Home Care Instructions  Activity: Get plenty of rest for the remainder of the day. A responsible individual must stay with you for 24 hours following the procedure.  For the next 24 hours, DO NOT: -Drive a car -Paediatric nurse -Drink alcoholic beverages -Take any medication unless instructed by your physician -Make any legal decisions or sign important papers.  Meals: Start with liquid foods such as gelatin or soup. Progress to regular foods as tolerated. Avoid greasy, spicy, heavy foods. If nausea and/or vomiting occur, drink only clear liquids until the nausea and/or vomiting subsides. Call your physician if vomiting continues.  Special Instructions/Symptoms: Your throat may feel dry or sore from the anesthesia or the breathing tube placed in your throat during surgery. If this causes discomfort, gargle with warm salt water. The discomfort should disappear within 24 hours.  If you had a scopolamine patch placed behind your ear for the management of post- operative nausea and/or vomiting:  1. The medication in the patch is effective for 72 hours, after which it should be removed.  Wrap patch in a tissue and discard in the trash. Wash hands thoroughly with soap and water. 2. You may remove the patch earlier than 72 hours if you experience unpleasant side effects which may include dry mouth, dizziness or visual disturbances. 3. Avoid  touching the patch. Wash your hands with soap and water after contact with the patch.

## 2020-08-07 NOTE — Anesthesia Procedure Notes (Signed)
Procedure Name: LMA Insertion Date/Time: 08/07/2020 9:36 AM Performed by: Kathryne Hitch, CRNA Pre-anesthesia Checklist: Patient identified, Emergency Drugs available, Suction available and Patient being monitored Patient Re-evaluated:Patient Re-evaluated prior to induction Oxygen Delivery Method: Circle system utilized Preoxygenation: Pre-oxygenation with 100% oxygen Induction Type: IV induction Ventilation: Mask ventilation without difficulty LMA: LMA inserted LMA Size: 4.0 Number of attempts: 1 Placement Confirmation: positive ETCO2 and breath sounds checked- equal and bilateral Tube secured with: Tape

## 2020-08-07 NOTE — Op Note (Signed)
Preoperative diagnosis: clinical stage I right breast cancer Postoperative diagnosis: saa Procedure: Right breast seed guided lumpectomy Surgeon: Dr Serita Grammes    Anesthesia general EBL minimal Complications none Drains none Specimens: 1. Right breast lumpectomy marked with paint, seed and clip present 2. Additional inferior, medial and posterior margins marked short superior, long lateral double deep Sponge and needle count correct times two dispo to recovery stable  Indications: 43 yof retired lives in [place] with her husband, no prior breast history, no fh underwent screening mm. she has no mass or dc. mm showed c density breasts. there was distortion found on right side. on [place] there is a 1.3x1.1x1.2 cm mass. no ax adenopathy. 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 after discussion with med onc and rad onc we elected to proceed with lumpectomy alone.  Procedure: After informed consent obtained she was taken to the OR. She had her seed placed and I had a mammogram in the OR.  She was given antibiotics and had SCDs in place.  She was placed under general anesthesia without complication.  She was prepped and draped in standard fashion. Timeout was performed.   The tumor was in the right lateral breast.   It was fairly close to the skin.  Due to the location close to the skin I elected to make a curvilinear incision right over the tumor.  I infiltrated Marcaine made the incision and then used the neoprobe to guide the excision of the seed with an attempt to get a rim of normal tissue around it.  Mammogram confirmed removal of the seed and the clip.  I thought I was close to 3 margins on the 3D imaging intraoperatively.   I removed the additional margins as above.  These were marked as above. I then obtained hemostasis.  I closed the breast tissue with 2-0 Vicryl suture.  I then closed the skin with 3-0 Vicryl and 4-0 Monocryl.  Glue and Steri-Strips were  applied.  She tolerated this well was extubated and transferred to the recovery room in stable condition.

## 2020-08-07 NOTE — H&P (Signed)
74 yof retired lives in [place] with her husband, no prior breast history, no fh underwent screening mm. she has no mass or dc. mm showed c density breasts. there was distortion found on right side. on [place] there is a 1.3x1.1x1.2 cm mass. no ax adenopathy. 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 she is here with her husband to discuss options.  Past Surgical History Illene Regulus, CMA; 07/12/2020 10:23 AM) Breast Biopsy  Right. Cataract Surgery  Bilateral. Knee Surgery  Bilateral. Spinal Surgery - Lower Back  Tonsillectomy   Diagnostic Studies History Lars Mage Spillers, CMA; 07/12/2020 10:23 AM) Colonoscopy  1-5 years ago Mammogram  within last year Pap Smear  1-5 years ago  Allergies Lars Mage Spillers, CMA; 07/12/2020 10:24 AM) Penicillins  Sulfa Drugs  Morphine Sulfate (Bulk) *ANALGESICS - OPIOID*   Medication History (Alisha Spillers, CMA; 07/12/2020 10:25 AM) Gabapentin (800MG  Tablet, Oral) Active. Lisinopril (20MG  Tablet, Oral) Active. metFORMIN HCl ER (500MG  Tablet ER 24HR, Oral) Active. Pantoprazole Sodium (40MG  Tablet DR, Oral) Active. Rosuvastatin Calcium (20MG  Tablet, Oral) Active. Allegra Allergy (180MG  Tablet, Oral) Active. Flonase (50MCG/DOSE Inhaler, Nasal) Active. Celecoxib (200MG  Capsule, Oral) Active. Medications Reconciled  Pregnancy / Birth History Illene Regulus, CMA; 07/12/2020 10:23 AM) Age at menarche  74 years. Gravida  2 Maternal age  63-20 Para  2  Other Problems Illene Regulus, CMA; 07/12/2020 10:23 AM) Arthritis  Back Pain  Diabetes Mellitus  Gastroesophageal Reflux Disease  High blood pressure  Hypercholesterolemia  Kidney Stone  Lump In Breast   Review of Systems Lars Mage Spillers CMA; 07/12/2020 10:23 AM) General Not Present- Appetite Loss, Chills, Fatigue, Fever, Night Sweats, Weight Gain and Weight Loss. Skin Not Present- Change in Wart/Mole, Dryness, Hives,  Jaundice, New Lesions, Non-Healing Wounds, Rash and Ulcer. HEENT Present- Hearing Loss. Not Present- Earache, Hoarseness, Nose Bleed, Oral Ulcers, Ringing in the Ears, Seasonal Allergies, Sinus Pain, Sore Throat, Visual Disturbances, Wears glasses/contact lenses and Yellow Eyes. Female Genitourinary Not Present- Frequency, Nocturia, Painful Urination, Pelvic Pain and Urgency.  Vitals (Alisha Spillers CMA; 07/12/2020 10:24 AM) 07/12/2020 10:23 AM Weight: 156.2 lb Height: 65in Body Surface Area: 1.78 m Body Mass Index: 25.99 kg/m  Pulse: 78 (Regular)  BP: 162/80(Sitting, Left Arm, Standard)  Physical Exam Rolm Bookbinder MD; 07/12/2020 10:31 AM) General Mental Status-Alert. Orientation-Oriented X3. Breast Nipples-No Discharge. Breast Lump-No Palpable Breast Mass. Lymphatic Head & Neck General Head & Neck Lymphatics: Bilateral - Description - Normal. Axillary General Axillary Region: Bilateral - Description - Normal. Note: no  adenopathy   Assessment & Plan Rolm Bookbinder MD; 07/12/2020 11:11 AM) BREAST CANCER OF UPPER-OUTER QUADRANT OF RIGHT FEMALE BREAST (C50.411) Story: Right breast seed guided lumpectomy, likely omit sn biopsy We discussed the staging and pathophysiology of breast cancer. We discussed all of the different options for treatment for breast cancer including surgery, chemotherapy, radiation therapy, Herceptin, and antiestrogen therapy. We discussed the options for treatment of the breast cancer which included lumpectomy versus a mastectomy. We discussed the performance of the lumpectomy with radioactive seed placement. We discussed a 5-10% chance of a positive margin requiring reexcision in the operating room. We also discussed that she will likely need radiation therapy if she undergoes lumpectomy. We discussed mastectomy and the postoperative care for that as well. The decision for lumpectomy vs mastectomy has no impact on decision for  chemotherapy. Most mastectomy patients will not need radiation therapy. We discussed that there is no difference in her survival whether she  undergoes lumpectomy with radiation therapy or antiestrogen therapy versus a mastectomy. There is also no real difference between her recurrence in the breast. I think based on choosing wisely guidelines and subsequent literature we could omit sn biopsy and proceed with oncotype to determine adjuvant therapy. we discussed morbidity of sn biopsy as well as issues with local recurrence and omission but no difference in survival. I will refer to see Drs Janese Banks and Chrystal at [facility] and make sure they are ok with this plan which they are We discussed the risks of operation including bleeding, infection, possible reoperation. She understands her further therapy will be based on what her stages at the time of her operation.

## 2020-08-07 NOTE — Transfer of Care (Signed)
Immediate Anesthesia Transfer of Care Note  Patient: Theresa Ferguson  Procedure(s) Performed: RIGHT BREAST LUMPECTOMY WITH RADIOACTIVE SEED LOCALIZATION (Right Breast)  Patient Location: PACU  Anesthesia Type:General  Level of Consciousness: drowsy and patient cooperative  Airway & Oxygen Therapy: Patient Spontanous Breathing and Patient connected to face mask oxygen  Post-op Assessment: Report given to RN and Post -op Vital signs reviewed and stable  Post vital signs: Reviewed and stable  Last Vitals:  Vitals Value Taken Time  BP 96/44 08/07/20 1023  Temp    Pulse 70 08/07/20 1024  Resp 9 08/07/20 1024  SpO2 100 % 08/07/20 1024  Vitals shown include unvalidated device data.  Last Pain:  Vitals:   08/07/20 0831  TempSrc: Oral  PainSc: 0-No pain         Complications: No complications documented.

## 2020-08-07 NOTE — Interval H&P Note (Signed)
History and Physical Interval Note:  08/07/2020 9:15 AM  [name]  has presented today for surgery, with the diagnosis of RIGHT BREAST CANCER.  The various methods of treatment have been discussed with the patient and family. After consideration of risks, benefits and other options for treatment, the patient has consented to  Procedure(s): RIGHT BREAST LUMPECTOMY WITH RADIOACTIVE SEED LOCALIZATION (Right) as a surgical intervention.  The patient's history has been reviewed, patient examined, no change in status, stable for surgery.  I have reviewed the patient's chart and labs.  Questions were answered to the patient's satisfaction.     Rolm Bookbinder

## 2020-08-07 NOTE — Anesthesia Postprocedure Evaluation (Signed)
Anesthesia Post Note  Patient: Theresa Ferguson  Procedure(s) Performed: RIGHT BREAST LUMPECTOMY WITH RADIOACTIVE SEED LOCALIZATION (Right Breast)     Patient location during evaluation: PACU Anesthesia Type: General Level of consciousness: awake and alert Pain management: pain level controlled Vital Signs Assessment: post-procedure vital signs reviewed and stable Respiratory status: spontaneous breathing, nonlabored ventilation and respiratory function stable Cardiovascular status: blood pressure returned to baseline and stable Postop Assessment: no apparent nausea or vomiting Anesthetic complications: no   No complications documented.  Last Vitals:  Vitals:   08/07/20 1101 08/07/20 1114  BP: (!) 166/67 (!) 141/74  Pulse: 70 72  Resp: 15 16  Temp:  36.4 C  SpO2: 95% 96%    Last Pain:  Vitals:   08/07/20 1111  TempSrc:   PainSc: 0-No pain                 Stanisha Lorenz,W. EDMOND

## 2020-08-09 ENCOUNTER — Encounter (HOSPITAL_BASED_OUTPATIENT_CLINIC_OR_DEPARTMENT_OTHER): Payer: Self-pay | Admitting: General Surgery

## 2020-08-09 LAB — SURGICAL PATHOLOGY

## 2020-08-13 ENCOUNTER — Inpatient Hospital Stay (HOSPITAL_BASED_OUTPATIENT_CLINIC_OR_DEPARTMENT_OTHER): Payer: Medicare Other | Admitting: Oncology

## 2020-08-13 ENCOUNTER — Encounter: Payer: Self-pay | Admitting: Oncology

## 2020-08-13 DIAGNOSIS — Z170 Estrogen receptor positive status [ER+]: Secondary | ICD-10-CM

## 2020-08-13 DIAGNOSIS — Z7189 Other specified counseling: Secondary | ICD-10-CM

## 2020-08-13 DIAGNOSIS — C50411 Malignant neoplasm of upper-outer quadrant of right female breast: Secondary | ICD-10-CM | POA: Diagnosis not present

## 2020-08-13 MED ORDER — ANASTROZOLE 1 MG PO TABS: 1.0000 mg | ORAL_TABLET | Freq: Every day | ORAL | 3 refills | Status: DC

## 2020-08-13 NOTE — Progress Notes (Signed)
Pt to speak to Janese Banks about plan for pt.

## 2020-08-15 DIAGNOSIS — Z23 Encounter for immunization: Secondary | ICD-10-CM | POA: Diagnosis not present

## 2020-08-19 DIAGNOSIS — Z7189 Other specified counseling: Secondary | ICD-10-CM | POA: Insufficient documentation

## 2020-08-19 NOTE — Progress Notes (Signed)
I connected with Theresa Ferguson on 08/19/20 at  1:00 PM EST by video enabled telemedicine visit and verified that I am speaking with the correct person using two identifiers.   I discussed the limitations, risks, security and privacy concerns of performing an evaluation and management service by telemedicine and the availability of in-person appointments. I also discussed with the patient that there may be a patient responsible charge related to this service. The patient expressed understanding and agreed to proceed.  Other persons participating in the visit and their role in the encounter:  none  Patient's location:  home Provider's location:  work  Risk analyst Complaint: Discuss pathology results and further management  History of present illness: Patient is a 74 year old female who underwent routine bilateral screening mammogram in October 2021 which showed a possible distortion in the right breast.  This was followed by diagnostic mammogram and ultrasound which showed a 1.1 x 1.3 x 1.2 cm mass at the 9:30 position in the right breast.  No adenopathy noted in the right axilla.  Ultrasound-guided biopsy showed grade 1 ER PR positive HER-2 negative tumor with a Ki-67 of 10%.  Patient has met with Dr. Donne Hazel and plan is for a lumpectomy.  Based on choosing wisely guidelines sentinel lymph node biopsy was deemed to be not necessary.  Patient is doing well for her age and denies any complaints at this time.  She is G2 P2 L2. With no prior history of breast cancer or abnormal breast biopsies.  No family history of: Pancreatic cancer.  History of breast cancer in her first cousin in her 98s.  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  Grade 2 Ki-67 10% ER 95% PR 95% positive and HER-2 negative by FISH  Interval history : Patient is doing well post lumpectomy and denies any complaints at this time   Review of Systems   Constitutional: Negative for chills, fever, malaise/fatigue and weight loss.  HENT: Negative for congestion, ear discharge and nosebleeds.   Eyes: Negative for blurred vision.  Respiratory: Negative for cough, hemoptysis, sputum production, shortness of breath and wheezing.   Cardiovascular: Negative for chest pain, palpitations, orthopnea and claudication.  Gastrointestinal: Negative for abdominal pain, blood in stool, constipation, diarrhea, heartburn, melena, nausea and vomiting.  Genitourinary: Negative for dysuria, flank pain, frequency, hematuria and urgency.  Musculoskeletal: Negative for back pain, joint pain and myalgias.  Skin: Negative for rash.  Neurological: Negative for dizziness, tingling, focal weakness, seizures, weakness and headaches.  Endo/Heme/Allergies: Does not bruise/bleed easily.  Psychiatric/Behavioral: Negative for depression and suicidal ideas. The patient does not have insomnia.     Allergies  Allergen Reactions  . Codeine   . Latex   . Morphine And Related   . Nickel   . Penicillins     Past Medical History:  Diagnosis Date  . Anemia   . Anxiety   . Arthritis   . Cancer ([place]) 05/2020   right breast IMC  . Complication of anesthesia   . CTS (carpal tunnel syndrome)    bil, by [facility]/[place]  . GERD (gastroesophageal reflux disease)   . Hyperlipemia   . Hypertension   . 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 PONV (postoperative nausea and vomiting)   . Radiculopathy of lumbosacral region 02/02/2013  . RLS (restless legs syndrome) 02/05/2015  . Seasonal allergies     Past Surgical History:  Procedure Laterality Date  . BREAST LUMPECTOMY WITH RADIOACTIVE  SEED LOCALIZATION Right 08/07/2020   Procedure: RIGHT BREAST LUMPECTOMY WITH RADIOACTIVE SEED LOCALIZATION;  Surgeon: Emelia Loron, MD;  Location: [facility];  Service: General;  Laterality: Right;  . FINGER SURGERY Right 1998   [facility]  . kidney stones  1980  . KNEE SURGERY  Bilateral    partial right-2008,left full replacement-2006-Dr [facility]  . LAMINECTOMY     l4-5  . LUMBAR DISC SURGERY      Social History   Socioeconomic History  . Marital status: Married    Spouse name: Fayrene Fearing  . Number of children: 2  . Years of education: College  . Highest education level: Not on file  Occupational History  . 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 Smoking status: Former Games developer  . Smokeless tobacco: Never Used  Vaping Use  . Vaping Use: Never used  Substance and Sexual Activity  . Alcohol use: Yes    Comment: socially  . Drug use: No  . Sexual activity: Not Currently    Birth control/protection: Post-menopausal  Other Topics Concern  . Not on file  Social History Narrative   Patient is married Fayrene Fearing) and lives at home with her husband.   Patient has two adult children.   Patient is retired.   Patient has a college education.   Patient is right-handed.   Patient drinks two cups of coffee daily.   Social Determinants of Health   Financial Resource Strain: Not on file  Food Insecurity: Not on file  Transportation Needs: Not on file  Physical Activity: Not on file  Stress: Not on file  Social Connections: Not on file  Intimate Partner Violence: Not on file    Family History  Problem Relation Age of Onset  . Diabetes Son   . Breast cancer Neg Hx      Current Outpatient Medications:  .  aspirin 81 MG tablet, Take 81 mg by mouth daily. , Disp: , Rfl:  .  celecoxib (CELEBREX) 200 MG capsule, Take 200 mg by mouth daily., Disp: , Rfl:  .  escitalopram (LEXAPRO) 10 MG tablet, Take 10 mg by mouth daily., Disp: , Rfl:  .  fluticasone (FLONASE) 50 MCG/ACT nasal spray, Place 1 spray into both nostrils as needed. , Disp: , Rfl:  .  gabapentin (NEURONTIN) 800 MG tablet, Take one by mouth at morning . Lunch and 2 at night., Disp: 360 tablet, Rfl: 3 .  lisinopril-hydrochlorothiazide  (PRINZIDE,ZESTORETIC) 20-12.5 MG per tablet, Take 1 tablet by mouth daily., Disp: , Rfl:  .  magnesium oxide (MAG-OX) 400 MG tablet, Take 400 mg by mouth daily., Disp: , Rfl:  .  metFORMIN (GLUCOPHAGE) 500 MG tablet, Take 1,000 mg by mouth 2 (two) times daily. At bedtime, Disp: , Rfl:  .  Multiple Vitamin (MULTIVITAMIN) capsule, Take 1 capsule by mouth daily., Disp: , Rfl:  .  pantoprazole (PROTONIX) 40 MG tablet, Take 40 mg by mouth daily., Disp: , Rfl:  .  rosuvastatin (CRESTOR) 10 MG tablet, Take 10 mg by mouth daily., Disp: , Rfl:  .  anastrozole (ARIMIDEX) 1 MG tablet, Take 1 tablet (1 mg total) by mouth daily. (Patient not taking: Reported on 08/13/2020), Disp: 30 tablet, Rfl: 3 .  NONFORMULARY OR COMPOUNDED ITEM, Amantadine 8%, Baclofen 2%, gabapentin 6%, amitriptyline 4%, bupivacaine 2%, clonidine 2% (Patient not taking: Reported on 08/13/2020), Disp: 1 each, Rfl: 11  MM Breast Surgical Specimen  Result  Date: 08/07/2020 CLINICAL DATA:  Right lumpectomy for invasive mammary carcinoma marked with a ribbon shaped biopsy marker clip. EXAM: SPECIMEN RADIOGRAPH OF THE RIGHT BREAST COMPARISON:  Previous exam(s). FINDINGS: Status post excision of the right breast. The radioactive seed and ribbon shaped biopsy marker clip are present in the specimen, completely intact. IMPRESSION: Specimen radiograph of the right breast. Electronically Signed   By: Claudie Revering M.D.   On: 08/07/2020 09:58   MM RT RADIOACTIVE SEED LOC MAMMO GUIDE  Result Date: 08/05/2020 CLINICAL DATA:  Biopsy proven invasive mammary carcinoma in the right breast. EXAM: MAMMOGRAPHIC GUIDED RADIOACTIVE SEED LOCALIZATION OF THE RIGHT BREAST COMPARISON:  Previous exam(s). FINDINGS: Patient presents for radioactive seed localization prior to surgery. I met with the patient and we discussed the procedure of seed localization including benefits and alternatives. We discussed the high likelihood of a successful procedure. We discussed the  risks of the procedure including infection, bleeding, tissue injury and further surgery. We discussed the low dose of radioactivity involved in the procedure. Informed, written consent was given. The usual time-out protocol was performed immediately prior to the procedure. Using mammographic guidance, sterile technique, 1% lidocaine and an I-125 radioactive seed, the ribbon shaped was localized using a lateral to medial approach. The follow-up mammogram images confirm the seed in the expected location and were marked for Dr. Donne Hazel. Follow-up survey of the patient confirms presence of the radioactive seed. Order number of I-125 seed:  [id number]. Total activity:  0.300 millicurie reference Date: 07/30/2020 The patient tolerated the procedure well and was released from the [facility]. She was given instructions regarding seed removal. IMPRESSION: Radioactive seed localization right breast. No apparent complications. Electronically Signed   By: Lillia Mountain M.D.   On: 08/05/2020 11:35    No images are attached to the encounter.   CMP Latest Ref Rng & Units 08/05/2020  Glucose 70 - 99 mg/dL 132(H)  BUN 8 - 23 mg/dL 19  Creatinine 0.44 - 1.00 mg/dL 0.84  Sodium 135 - 145 mmol/L 137  Potassium 3.5 - 5.1 mmol/L 4.5  Chloride 98 - 111 mmol/L 104  CO2 22 - 32 mmol/L 26  Calcium 8.9 - 10.3 mg/dL 9.6   No flowsheet data found.   Observation/objective: Appears in no acute distress over video visit today.  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  She is here to discuss final pathology results and further management  Final pathology showed a 1.2 cm grade 2 tumor that was strongly ER/PR positive and HER-2/neu negative with negative margins.  Sentinel lymph node biopsy was not done based on choosing wisely guidelines.  I  discussed that in an ideal world on Oncotype testing would be recommended to see if she would benefit from adjuvant chemotherapy.  Patient does not want to take chemotherapy even if that would be an option of Oncotype testing comes back as high risk.  I will therefore not be sending Oncotype testing.  She will be referred to Dr. Donella Stade to discuss adjuvant radiation therapy.  Given that her tumor was ER PR positive hormone therapy is indicated at this time.  Idiscussed the role for hormone therapy. Given that she is postmenopausal I would favor 5 years of adjuvant hormone therapy with aromatase inhibitor. I discussed the risks and benefits of Arimidex including all but not limited to fatigue, hypercholesterolemia,  hot flashes, arthralgias and worsening bone health.  Patient will also need to be on calcium 1200 mg along with vitamin D 800 international units.  Her baseline bone density scan showed osteopenia with a T score of -1.1 in the left femur and hip.  10-year probability of major osteoporotic fracture was 14.3% and hip fracture was 1.6%.  She does not require any bisphosphonates for this.  It would be okay to continue Arimidex after radiation therapy and monitor her bone density scan closely.  Patient verbalized understanding and agrees to proceed.  Treatment will be given with a curative intent  Cancer Staging Malignant neoplasm of upper-outer quadrant of right breast in female, estrogen receptor positive ([place]) Staging form: Breast, AJCC 8th Edition - Clinical stage from 07/02/2020: Stage IA (cT1c, cN0, cM0, G1, ER+, PR+, HER2-) - Signed by Gardenia Phlegm, NP on 07/10/2020 - Pathologic stage from 08/13/2020: Stage Unknown (pT1c, pNX, cM0, G2, ER+, PR+, HER2-) - Signed by Sindy Guadeloupe, MD on 08/19/2020   Follow-up instructions: I will see her sometime in second week of April after she completes radiation therapy and has been taking Arimidex for about 4 to 6 weeks in person with a CMP  I  discussed the assessment and treatment plan with the patient. The patient was provided an opportunity to ask questions and all were answered. The patient agreed with the plan and demonstrated an understanding of the instructions.   The patient was advised to call back or seek an in-person evaluation if the symptoms worsen or if the condition fails to improve as anticipated.    Visit Diagnosis: 1. Malignant neoplasm of upper-outer quadrant of right breast in female, estrogen receptor positive ([place])   2. Goals of care, counseling/discussion     Dr. Randa Evens, MD, MPH [facility] Tel- [phone] 08/19/2020 8:18 AM

## 2020-08-20 DIAGNOSIS — C50411 Malignant neoplasm of upper-outer quadrant of right female breast: Secondary | ICD-10-CM | POA: Diagnosis not present

## 2020-08-20 DIAGNOSIS — Z170 Estrogen receptor positive status [ER+]: Secondary | ICD-10-CM | POA: Diagnosis not present

## 2020-08-21 ENCOUNTER — Encounter: Payer: Self-pay | Admitting: Oncology

## 2020-08-21 NOTE — Progress Notes (Signed)
No worries. I will tell her that

## 2020-08-22 ENCOUNTER — Encounter (HOSPITAL_COMMUNITY): Payer: Self-pay

## 2020-08-26 ENCOUNTER — Other Ambulatory Visit: Payer: Self-pay

## 2020-08-26 ENCOUNTER — Ambulatory Visit
Admission: RE | Admit: 2020-08-26 | Discharge: 2020-08-26 | Disposition: A | Discharge status: Home / Self Care | Payer: Medicare Other | Source: Ambulatory Visit | Attending: Radiation Oncology | Admitting: Radiation Oncology

## 2020-08-26 DIAGNOSIS — Z170 Estrogen receptor positive status [ER+]: Secondary | ICD-10-CM | POA: Diagnosis not present

## 2020-08-26 DIAGNOSIS — C50411 Malignant neoplasm of upper-outer quadrant of right female breast: Secondary | ICD-10-CM | POA: Diagnosis not present

## 2020-08-26 NOTE — Progress Notes (Signed)
Radiation Oncology Follow up Note  Name: Theresa Ferguson   Date:   08/26/2020 MRN:  [id number] DOB: 1945/09/08    This 74 y.o. female presents to the clinic today for status post completion of her surgery.  For invasive carcinoma the right breast stage I  REFERRING PROVIDER: Kelton Pillar, MD  HPI: Patient is a 74 year old female I initially consulted back in November..  She went on to have a wide local excision without sentinel node biopsy based on new ASCO recommendations.  Tumor was 1.2 cm invasive ductal carcinoma with margins widely clear.  She has done well postoperatively specifically denies any breast tenderness cough or bone pain.  She had Oncotype DX performed showing a recurrence risk of 9 with no benefit to systemic chemotherapy.  She is now seen to commence with radiation therapy to her right breast.  COMPLICATIONS OF TREATMENT: none  FOLLOW UP COMPLIANCE: keeps appointments   PHYSICAL EXAM:  BP (P) 139/80 (BP Location: Left Arm, Patient Position: Sitting)   Pulse (P) 82   Temp (!) (P) 97.1 F (36.2 C) (Tympanic)   Resp (P) 16   Wt (P) 154 lb 4.8 oz (70 kg)   BMI (P) 25.68 kg/m  She has a single scar in the lateral aspect of her right breast which is healing well.  No dominant mass or nodularity is noted in either breast in 2 positions examined.  Well-developed well-nourished patient in NAD. HEENT reveals PERLA, EOMI, discs not visualized.  Oral cavity is clear. No oral mucosal lesions are identified. Neck is clear without evidence of cervical or supraclavicular adenopathy. Lungs are clear to A&P. Cardiac examination is essentially unremarkable with regular rate and rhythm without murmur rub or thrill. Abdomen is benign with no organomegaly or masses noted. Motor sensory and DTR levels are equal and symmetric in the upper and lower extremities. Cranial nerves II through XII are grossly intact. Proprioception is intact. No peripheral adenopathy or edema is identified. No motor  or sensory levels are noted. Crude visual fields are within normal range.  RADIOLOGY RESULTS: No current films for review  PLAN: Present time patient I have recommended whole breast radiation.  She is a large pendulous breast making hypofractionated course of treatment difficult.  Would plan on delivering 5040 cGy in 28 fractions boosting her scar another 1400 cGy using electron beam.  Risks and benefits of treatment including skin reaction fatigue alteration of blood counts possible occlusion of superficial lung all were discussed in detail with the patient.  I have personally set up and ordered CT simulation for early next week.  Patient also will benefit from antiestrogen therapy after completion of radiation.  I would like to take this opportunity to thank you for allowing me to participate in the care of your patient.Noreene Filbert, MD

## 2020-09-03 ENCOUNTER — Ambulatory Visit
Admission: RE | Admit: 2020-09-03 | Discharge: 2020-09-03 | Disposition: A | Discharge status: Home / Self Care | Payer: Medicare Other | Source: Ambulatory Visit | Attending: Radiation Oncology | Admitting: Radiation Oncology

## 2020-09-03 DIAGNOSIS — C50411 Malignant neoplasm of upper-outer quadrant of right female breast: Secondary | ICD-10-CM | POA: Insufficient documentation

## 2020-09-03 DIAGNOSIS — Z170 Estrogen receptor positive status [ER+]: Secondary | ICD-10-CM | POA: Diagnosis not present

## 2020-09-03 DIAGNOSIS — Z510 Encounter for antineoplastic radiation therapy: Secondary | ICD-10-CM | POA: Insufficient documentation

## 2020-09-05 DIAGNOSIS — Z170 Estrogen receptor positive status [ER+]: Secondary | ICD-10-CM | POA: Diagnosis not present

## 2020-09-05 DIAGNOSIS — Z510 Encounter for antineoplastic radiation therapy: Secondary | ICD-10-CM | POA: Diagnosis not present

## 2020-09-05 DIAGNOSIS — C50411 Malignant neoplasm of upper-outer quadrant of right female breast: Secondary | ICD-10-CM | POA: Diagnosis not present

## 2020-09-06 ENCOUNTER — Other Ambulatory Visit: Payer: Self-pay | Admitting: *Deleted

## 2020-09-06 DIAGNOSIS — C50411 Malignant neoplasm of upper-outer quadrant of right female breast: Secondary | ICD-10-CM

## 2020-09-10 ENCOUNTER — Ambulatory Visit: Admission: RE | Admit: 2020-09-10 | Payer: Medicare Other | Source: Ambulatory Visit

## 2020-09-10 DIAGNOSIS — C50411 Malignant neoplasm of upper-outer quadrant of right female breast: Secondary | ICD-10-CM | POA: Diagnosis not present

## 2020-09-10 DIAGNOSIS — Z510 Encounter for antineoplastic radiation therapy: Secondary | ICD-10-CM | POA: Diagnosis not present

## 2020-09-10 DIAGNOSIS — Z170 Estrogen receptor positive status [ER+]: Secondary | ICD-10-CM | POA: Diagnosis not present

## 2020-09-11 ENCOUNTER — Ambulatory Visit
Admission: RE | Admit: 2020-09-11 | Discharge: 2020-09-11 | Disposition: A | Discharge status: Home / Self Care | Payer: Medicare Other | Source: Ambulatory Visit | Attending: Radiation Oncology | Admitting: Radiation Oncology

## 2020-09-11 DIAGNOSIS — Z510 Encounter for antineoplastic radiation therapy: Secondary | ICD-10-CM | POA: Diagnosis not present

## 2020-09-11 DIAGNOSIS — C50411 Malignant neoplasm of upper-outer quadrant of right female breast: Secondary | ICD-10-CM | POA: Diagnosis not present

## 2020-09-11 DIAGNOSIS — Z170 Estrogen receptor positive status [ER+]: Secondary | ICD-10-CM | POA: Diagnosis not present

## 2020-09-12 ENCOUNTER — Ambulatory Visit
Admission: RE | Admit: 2020-09-12 | Discharge: 2020-09-12 | Disposition: A | Discharge status: Home / Self Care | Payer: Medicare Other | Source: Ambulatory Visit | Attending: Radiation Oncology | Admitting: Radiation Oncology

## 2020-09-12 DIAGNOSIS — Z170 Estrogen receptor positive status [ER+]: Secondary | ICD-10-CM | POA: Diagnosis not present

## 2020-09-12 DIAGNOSIS — Z510 Encounter for antineoplastic radiation therapy: Secondary | ICD-10-CM | POA: Diagnosis not present

## 2020-09-12 DIAGNOSIS — C50411 Malignant neoplasm of upper-outer quadrant of right female breast: Secondary | ICD-10-CM | POA: Diagnosis not present

## 2020-09-13 ENCOUNTER — Ambulatory Visit
Admission: RE | Admit: 2020-09-13 | Discharge: 2020-09-13 | Disposition: A | Discharge status: Home / Self Care | Payer: Medicare Other | Source: Ambulatory Visit | Attending: Radiation Oncology | Admitting: Radiation Oncology

## 2020-09-13 DIAGNOSIS — Z170 Estrogen receptor positive status [ER+]: Secondary | ICD-10-CM | POA: Diagnosis not present

## 2020-09-13 DIAGNOSIS — Z510 Encounter for antineoplastic radiation therapy: Secondary | ICD-10-CM | POA: Diagnosis not present

## 2020-09-13 DIAGNOSIS — C50411 Malignant neoplasm of upper-outer quadrant of right female breast: Secondary | ICD-10-CM | POA: Diagnosis not present

## 2020-09-16 ENCOUNTER — Ambulatory Visit
Admission: RE | Admit: 2020-09-16 | Discharge: 2020-09-16 | Disposition: A | Discharge status: Home / Self Care | Payer: Medicare Other | Source: Ambulatory Visit | Attending: Radiation Oncology | Admitting: Radiation Oncology

## 2020-09-16 DIAGNOSIS — Z170 Estrogen receptor positive status [ER+]: Secondary | ICD-10-CM | POA: Diagnosis not present

## 2020-09-16 DIAGNOSIS — Z510 Encounter for antineoplastic radiation therapy: Secondary | ICD-10-CM | POA: Diagnosis not present

## 2020-09-16 DIAGNOSIS — C50411 Malignant neoplasm of upper-outer quadrant of right female breast: Secondary | ICD-10-CM | POA: Diagnosis not present

## 2020-09-17 ENCOUNTER — Ambulatory Visit
Admission: RE | Admit: 2020-09-17 | Discharge: 2020-09-17 | Disposition: A | Discharge status: Home / Self Care | Payer: Medicare Other | Source: Ambulatory Visit | Attending: Radiation Oncology | Admitting: Radiation Oncology

## 2020-09-17 DIAGNOSIS — Z170 Estrogen receptor positive status [ER+]: Secondary | ICD-10-CM | POA: Diagnosis not present

## 2020-09-17 DIAGNOSIS — Z510 Encounter for antineoplastic radiation therapy: Secondary | ICD-10-CM | POA: Diagnosis not present

## 2020-09-17 DIAGNOSIS — C50411 Malignant neoplasm of upper-outer quadrant of right female breast: Secondary | ICD-10-CM | POA: Diagnosis not present

## 2020-09-18 ENCOUNTER — Ambulatory Visit
Admission: RE | Admit: 2020-09-18 | Discharge: 2020-09-18 | Disposition: A | Discharge status: Home / Self Care | Payer: Medicare Other | Source: Ambulatory Visit | Attending: Radiation Oncology | Admitting: Radiation Oncology

## 2020-09-18 DIAGNOSIS — Z23 Encounter for immunization: Secondary | ICD-10-CM | POA: Diagnosis not present

## 2020-09-18 DIAGNOSIS — I129 Hypertensive chronic kidney disease with stage 1 through stage 4 chronic kidney disease, or unspecified chronic kidney disease: Secondary | ICD-10-CM | POA: Diagnosis not present

## 2020-09-18 DIAGNOSIS — M129 Arthropathy, unspecified: Secondary | ICD-10-CM | POA: Diagnosis not present

## 2020-09-18 DIAGNOSIS — Z510 Encounter for antineoplastic radiation therapy: Secondary | ICD-10-CM | POA: Diagnosis not present

## 2020-09-18 DIAGNOSIS — C50411 Malignant neoplasm of upper-outer quadrant of right female breast: Secondary | ICD-10-CM | POA: Diagnosis not present

## 2020-09-18 DIAGNOSIS — E1121 Type 2 diabetes mellitus with diabetic nephropathy: Secondary | ICD-10-CM | POA: Diagnosis not present

## 2020-09-18 DIAGNOSIS — E782 Mixed hyperlipidemia: Secondary | ICD-10-CM | POA: Diagnosis not present

## 2020-09-18 DIAGNOSIS — Z170 Estrogen receptor positive status [ER+]: Secondary | ICD-10-CM | POA: Diagnosis not present

## 2020-09-19 ENCOUNTER — Ambulatory Visit
Admission: RE | Admit: 2020-09-19 | Discharge: 2020-09-19 | Disposition: A | Discharge status: Home / Self Care | Payer: Medicare Other | Source: Ambulatory Visit | Attending: Radiation Oncology | Admitting: Radiation Oncology

## 2020-09-19 DIAGNOSIS — C50411 Malignant neoplasm of upper-outer quadrant of right female breast: Secondary | ICD-10-CM | POA: Diagnosis not present

## 2020-09-19 DIAGNOSIS — Z510 Encounter for antineoplastic radiation therapy: Secondary | ICD-10-CM | POA: Diagnosis not present

## 2020-09-19 DIAGNOSIS — Z170 Estrogen receptor positive status [ER+]: Secondary | ICD-10-CM | POA: Diagnosis not present

## 2020-09-20 ENCOUNTER — Ambulatory Visit
Admission: RE | Admit: 2020-09-20 | Discharge: 2020-09-20 | Disposition: A | Discharge status: Home / Self Care | Payer: Medicare Other | Source: Ambulatory Visit | Attending: Radiation Oncology | Admitting: Radiation Oncology

## 2020-09-20 DIAGNOSIS — C50411 Malignant neoplasm of upper-outer quadrant of right female breast: Secondary | ICD-10-CM | POA: Diagnosis not present

## 2020-09-20 DIAGNOSIS — Z170 Estrogen receptor positive status [ER+]: Secondary | ICD-10-CM | POA: Diagnosis not present

## 2020-09-20 DIAGNOSIS — Z510 Encounter for antineoplastic radiation therapy: Secondary | ICD-10-CM | POA: Diagnosis not present

## 2020-09-23 ENCOUNTER — Ambulatory Visit
Admission: RE | Admit: 2020-09-23 | Discharge: 2020-09-23 | Disposition: A | Discharge status: Home / Self Care | Payer: Medicare Other | Source: Ambulatory Visit | Attending: Radiation Oncology | Admitting: Radiation Oncology

## 2020-09-23 DIAGNOSIS — Z170 Estrogen receptor positive status [ER+]: Secondary | ICD-10-CM | POA: Diagnosis not present

## 2020-09-23 DIAGNOSIS — Z510 Encounter for antineoplastic radiation therapy: Secondary | ICD-10-CM | POA: Diagnosis not present

## 2020-09-23 DIAGNOSIS — C50411 Malignant neoplasm of upper-outer quadrant of right female breast: Secondary | ICD-10-CM | POA: Diagnosis not present

## 2020-09-24 ENCOUNTER — Ambulatory Visit
Admission: RE | Admit: 2020-09-24 | Discharge: 2020-09-24 | Disposition: A | Discharge status: Home / Self Care | Payer: Medicare Other | Source: Ambulatory Visit | Attending: Radiation Oncology | Admitting: Radiation Oncology

## 2020-09-24 DIAGNOSIS — Z170 Estrogen receptor positive status [ER+]: Secondary | ICD-10-CM | POA: Diagnosis not present

## 2020-09-24 DIAGNOSIS — Z510 Encounter for antineoplastic radiation therapy: Secondary | ICD-10-CM | POA: Diagnosis not present

## 2020-09-24 DIAGNOSIS — C50411 Malignant neoplasm of upper-outer quadrant of right female breast: Secondary | ICD-10-CM | POA: Diagnosis not present

## 2020-09-25 ENCOUNTER — Ambulatory Visit
Admission: RE | Admit: 2020-09-25 | Discharge: 2020-09-25 | Disposition: A | Discharge status: Home / Self Care | Payer: Medicare Other | Source: Ambulatory Visit | Attending: Radiation Oncology | Admitting: Radiation Oncology

## 2020-09-25 DIAGNOSIS — Z170 Estrogen receptor positive status [ER+]: Secondary | ICD-10-CM | POA: Diagnosis not present

## 2020-09-25 DIAGNOSIS — C50411 Malignant neoplasm of upper-outer quadrant of right female breast: Secondary | ICD-10-CM | POA: Diagnosis not present

## 2020-09-25 DIAGNOSIS — Z510 Encounter for antineoplastic radiation therapy: Secondary | ICD-10-CM | POA: Diagnosis not present

## 2020-09-26 ENCOUNTER — Ambulatory Visit
Admission: RE | Admit: 2020-09-26 | Discharge: 2020-09-26 | Disposition: A | Discharge status: Home / Self Care | Payer: Medicare Other | Source: Ambulatory Visit | Attending: Radiation Oncology | Admitting: Radiation Oncology

## 2020-09-26 ENCOUNTER — Inpatient Hospital Stay: Payer: Medicare Other

## 2020-09-26 DIAGNOSIS — Z510 Encounter for antineoplastic radiation therapy: Secondary | ICD-10-CM | POA: Diagnosis not present

## 2020-09-26 DIAGNOSIS — Z170 Estrogen receptor positive status [ER+]: Secondary | ICD-10-CM | POA: Insufficient documentation

## 2020-09-26 DIAGNOSIS — C50411 Malignant neoplasm of upper-outer quadrant of right female breast: Secondary | ICD-10-CM

## 2020-09-26 LAB — CBC
HCT: 31.8 % — ABNORMAL LOW (ref 36.0–46.0)
Hemoglobin: 10.6 g/dL — ABNORMAL LOW (ref 12.0–15.0)
MCH: 31.2 pg (ref 26.0–34.0)
MCHC: 33.3 g/dL (ref 30.0–36.0)
MCV: 93.5 fL (ref 80.0–100.0)
Platelets: 150 10*3/uL (ref 150–400)
RBC: 3.4 MIL/uL — ABNORMAL LOW (ref 3.87–5.11)
RDW: 12.3 % (ref 11.5–15.5)
WBC: 3.8 10*3/uL — ABNORMAL LOW (ref 4.0–10.5)
nRBC: 0 % (ref 0.0–0.2)

## 2020-09-27 ENCOUNTER — Ambulatory Visit
Admission: RE | Admit: 2020-09-27 | Discharge: 2020-09-27 | Disposition: A | Discharge status: Home / Self Care | Payer: Medicare Other | Source: Ambulatory Visit | Attending: Radiation Oncology | Admitting: Radiation Oncology

## 2020-09-27 DIAGNOSIS — Z170 Estrogen receptor positive status [ER+]: Secondary | ICD-10-CM | POA: Diagnosis not present

## 2020-09-27 DIAGNOSIS — Z510 Encounter for antineoplastic radiation therapy: Secondary | ICD-10-CM | POA: Diagnosis not present

## 2020-09-27 DIAGNOSIS — C50411 Malignant neoplasm of upper-outer quadrant of right female breast: Secondary | ICD-10-CM | POA: Diagnosis not present

## 2020-09-30 ENCOUNTER — Ambulatory Visit
Admission: RE | Admit: 2020-09-30 | Discharge: 2020-09-30 | Disposition: A | Discharge status: Home / Self Care | Payer: Medicare Other | Source: Ambulatory Visit | Attending: Radiation Oncology | Admitting: Radiation Oncology

## 2020-09-30 DIAGNOSIS — Z170 Estrogen receptor positive status [ER+]: Secondary | ICD-10-CM | POA: Diagnosis not present

## 2020-09-30 DIAGNOSIS — Z510 Encounter for antineoplastic radiation therapy: Secondary | ICD-10-CM | POA: Diagnosis not present

## 2020-09-30 DIAGNOSIS — C50411 Malignant neoplasm of upper-outer quadrant of right female breast: Secondary | ICD-10-CM | POA: Diagnosis not present

## 2020-10-01 ENCOUNTER — Ambulatory Visit
Admission: RE | Admit: 2020-10-01 | Discharge: 2020-10-01 | Disposition: A | Discharge status: Home / Self Care | Payer: Medicare Other | Source: Ambulatory Visit | Attending: Radiation Oncology | Admitting: Radiation Oncology

## 2020-10-01 DIAGNOSIS — C50411 Malignant neoplasm of upper-outer quadrant of right female breast: Secondary | ICD-10-CM | POA: Insufficient documentation

## 2020-10-01 DIAGNOSIS — Z510 Encounter for antineoplastic radiation therapy: Secondary | ICD-10-CM | POA: Diagnosis not present

## 2020-10-01 DIAGNOSIS — Z170 Estrogen receptor positive status [ER+]: Secondary | ICD-10-CM | POA: Insufficient documentation

## 2020-10-02 ENCOUNTER — Ambulatory Visit
Admission: RE | Admit: 2020-10-02 | Discharge: 2020-10-02 | Disposition: A | Discharge status: Home / Self Care | Payer: Medicare Other | Source: Ambulatory Visit | Attending: Radiation Oncology | Admitting: Radiation Oncology

## 2020-10-02 DIAGNOSIS — C50411 Malignant neoplasm of upper-outer quadrant of right female breast: Secondary | ICD-10-CM | POA: Diagnosis not present

## 2020-10-02 DIAGNOSIS — Z170 Estrogen receptor positive status [ER+]: Secondary | ICD-10-CM | POA: Diagnosis not present

## 2020-10-02 DIAGNOSIS — Z510 Encounter for antineoplastic radiation therapy: Secondary | ICD-10-CM | POA: Diagnosis not present

## 2020-10-03 ENCOUNTER — Ambulatory Visit
Admission: RE | Admit: 2020-10-03 | Discharge: 2020-10-03 | Disposition: A | Discharge status: Home / Self Care | Payer: Medicare Other | Source: Ambulatory Visit | Attending: Radiation Oncology | Admitting: Radiation Oncology

## 2020-10-03 DIAGNOSIS — Z510 Encounter for antineoplastic radiation therapy: Secondary | ICD-10-CM | POA: Diagnosis not present

## 2020-10-03 DIAGNOSIS — C50411 Malignant neoplasm of upper-outer quadrant of right female breast: Secondary | ICD-10-CM | POA: Diagnosis not present

## 2020-10-03 DIAGNOSIS — Z170 Estrogen receptor positive status [ER+]: Secondary | ICD-10-CM | POA: Diagnosis not present

## 2020-10-04 ENCOUNTER — Ambulatory Visit
Admission: RE | Admit: 2020-10-04 | Discharge: 2020-10-04 | Disposition: A | Discharge status: Home / Self Care | Payer: Medicare Other | Source: Ambulatory Visit | Attending: Radiation Oncology | Admitting: Radiation Oncology

## 2020-10-04 DIAGNOSIS — Z510 Encounter for antineoplastic radiation therapy: Secondary | ICD-10-CM | POA: Diagnosis not present

## 2020-10-04 DIAGNOSIS — Z170 Estrogen receptor positive status [ER+]: Secondary | ICD-10-CM | POA: Diagnosis not present

## 2020-10-04 DIAGNOSIS — C50411 Malignant neoplasm of upper-outer quadrant of right female breast: Secondary | ICD-10-CM | POA: Diagnosis not present

## 2020-10-07 ENCOUNTER — Ambulatory Visit
Admission: RE | Admit: 2020-10-07 | Discharge: 2020-10-07 | Disposition: A | Discharge status: Home / Self Care | Payer: Medicare Other | Source: Ambulatory Visit | Attending: Radiation Oncology | Admitting: Radiation Oncology

## 2020-10-07 DIAGNOSIS — C50411 Malignant neoplasm of upper-outer quadrant of right female breast: Secondary | ICD-10-CM | POA: Diagnosis not present

## 2020-10-07 DIAGNOSIS — Z170 Estrogen receptor positive status [ER+]: Secondary | ICD-10-CM | POA: Diagnosis not present

## 2020-10-07 DIAGNOSIS — Z510 Encounter for antineoplastic radiation therapy: Secondary | ICD-10-CM | POA: Diagnosis not present

## 2020-10-08 ENCOUNTER — Ambulatory Visit
Admission: RE | Admit: 2020-10-08 | Discharge: 2020-10-08 | Disposition: A | Discharge status: Home / Self Care | Payer: Medicare Other | Source: Ambulatory Visit | Attending: Radiation Oncology | Admitting: Radiation Oncology

## 2020-10-08 DIAGNOSIS — C50411 Malignant neoplasm of upper-outer quadrant of right female breast: Secondary | ICD-10-CM | POA: Diagnosis not present

## 2020-10-08 DIAGNOSIS — Z510 Encounter for antineoplastic radiation therapy: Secondary | ICD-10-CM | POA: Diagnosis not present

## 2020-10-08 DIAGNOSIS — Z170 Estrogen receptor positive status [ER+]: Secondary | ICD-10-CM | POA: Diagnosis not present

## 2020-10-09 ENCOUNTER — Ambulatory Visit
Admission: RE | Admit: 2020-10-09 | Discharge: 2020-10-09 | Disposition: A | Discharge status: Home / Self Care | Payer: Medicare Other | Source: Ambulatory Visit | Attending: Radiation Oncology | Admitting: Radiation Oncology

## 2020-10-09 DIAGNOSIS — C50411 Malignant neoplasm of upper-outer quadrant of right female breast: Secondary | ICD-10-CM | POA: Diagnosis not present

## 2020-10-09 DIAGNOSIS — Z170 Estrogen receptor positive status [ER+]: Secondary | ICD-10-CM | POA: Diagnosis not present

## 2020-10-09 DIAGNOSIS — Z510 Encounter for antineoplastic radiation therapy: Secondary | ICD-10-CM | POA: Diagnosis not present

## 2020-10-10 ENCOUNTER — Inpatient Hospital Stay: Payer: Medicare Other

## 2020-10-10 ENCOUNTER — Ambulatory Visit
Admission: RE | Admit: 2020-10-10 | Discharge: 2020-10-10 | Disposition: A | Discharge status: Home / Self Care | Payer: Medicare Other | Source: Ambulatory Visit | Attending: Radiation Oncology | Admitting: Radiation Oncology

## 2020-10-10 DIAGNOSIS — Z170 Estrogen receptor positive status [ER+]: Secondary | ICD-10-CM

## 2020-10-10 DIAGNOSIS — C50411 Malignant neoplasm of upper-outer quadrant of right female breast: Secondary | ICD-10-CM

## 2020-10-10 DIAGNOSIS — Z510 Encounter for antineoplastic radiation therapy: Secondary | ICD-10-CM | POA: Diagnosis not present

## 2020-10-10 LAB — CBC
HCT: 30.2 % — ABNORMAL LOW (ref 36.0–46.0)
Hemoglobin: 10.2 g/dL — ABNORMAL LOW (ref 12.0–15.0)
MCH: 31.8 pg (ref 26.0–34.0)
MCHC: 33.8 g/dL (ref 30.0–36.0)
MCV: 94.1 fL (ref 80.0–100.0)
Platelets: 135 10*3/uL — ABNORMAL LOW (ref 150–400)
RBC: 3.21 MIL/uL — ABNORMAL LOW (ref 3.87–5.11)
RDW: 12.6 % (ref 11.5–15.5)
WBC: 3.4 10*3/uL — ABNORMAL LOW (ref 4.0–10.5)
nRBC: 0 % (ref 0.0–0.2)

## 2020-10-11 ENCOUNTER — Ambulatory Visit
Admission: RE | Admit: 2020-10-11 | Discharge: 2020-10-11 | Disposition: A | Discharge status: Home / Self Care | Payer: Medicare Other | Source: Ambulatory Visit | Attending: Radiation Oncology | Admitting: Radiation Oncology

## 2020-10-11 DIAGNOSIS — Z170 Estrogen receptor positive status [ER+]: Secondary | ICD-10-CM | POA: Diagnosis not present

## 2020-10-11 DIAGNOSIS — C50411 Malignant neoplasm of upper-outer quadrant of right female breast: Secondary | ICD-10-CM | POA: Diagnosis not present

## 2020-10-11 DIAGNOSIS — Z510 Encounter for antineoplastic radiation therapy: Secondary | ICD-10-CM | POA: Diagnosis not present

## 2020-10-14 ENCOUNTER — Ambulatory Visit
Admission: RE | Admit: 2020-10-14 | Discharge: 2020-10-14 | Disposition: A | Discharge status: Home / Self Care | Payer: Medicare Other | Source: Ambulatory Visit | Attending: Radiation Oncology | Admitting: Radiation Oncology

## 2020-10-14 DIAGNOSIS — C50411 Malignant neoplasm of upper-outer quadrant of right female breast: Secondary | ICD-10-CM | POA: Diagnosis not present

## 2020-10-14 DIAGNOSIS — Z510 Encounter for antineoplastic radiation therapy: Secondary | ICD-10-CM | POA: Diagnosis not present

## 2020-10-14 DIAGNOSIS — Z170 Estrogen receptor positive status [ER+]: Secondary | ICD-10-CM | POA: Diagnosis not present

## 2020-10-15 ENCOUNTER — Ambulatory Visit
Admission: RE | Admit: 2020-10-15 | Discharge: 2020-10-15 | Disposition: A | Discharge status: Home / Self Care | Payer: Medicare Other | Source: Ambulatory Visit | Attending: Radiation Oncology | Admitting: Radiation Oncology

## 2020-10-15 DIAGNOSIS — C50411 Malignant neoplasm of upper-outer quadrant of right female breast: Secondary | ICD-10-CM | POA: Diagnosis not present

## 2020-10-15 DIAGNOSIS — Z170 Estrogen receptor positive status [ER+]: Secondary | ICD-10-CM | POA: Diagnosis not present

## 2020-10-15 DIAGNOSIS — Z510 Encounter for antineoplastic radiation therapy: Secondary | ICD-10-CM | POA: Diagnosis not present

## 2020-10-16 ENCOUNTER — Ambulatory Visit
Admission: RE | Admit: 2020-10-16 | Discharge: 2020-10-16 | Disposition: A | Discharge status: Home / Self Care | Payer: Medicare Other | Source: Ambulatory Visit | Attending: Radiation Oncology | Admitting: Radiation Oncology

## 2020-10-16 DIAGNOSIS — Z170 Estrogen receptor positive status [ER+]: Secondary | ICD-10-CM | POA: Diagnosis not present

## 2020-10-16 DIAGNOSIS — C50411 Malignant neoplasm of upper-outer quadrant of right female breast: Secondary | ICD-10-CM | POA: Diagnosis not present

## 2020-10-16 DIAGNOSIS — Z510 Encounter for antineoplastic radiation therapy: Secondary | ICD-10-CM | POA: Diagnosis not present

## 2020-10-17 ENCOUNTER — Ambulatory Visit
Admission: RE | Admit: 2020-10-17 | Discharge: 2020-10-17 | Disposition: A | Discharge status: Home / Self Care | Payer: Medicare Other | Source: Ambulatory Visit | Attending: Radiation Oncology | Admitting: Radiation Oncology

## 2020-10-17 DIAGNOSIS — Z170 Estrogen receptor positive status [ER+]: Secondary | ICD-10-CM | POA: Diagnosis not present

## 2020-10-17 DIAGNOSIS — C50411 Malignant neoplasm of upper-outer quadrant of right female breast: Secondary | ICD-10-CM | POA: Diagnosis not present

## 2020-10-17 DIAGNOSIS — Z510 Encounter for antineoplastic radiation therapy: Secondary | ICD-10-CM | POA: Diagnosis not present

## 2020-10-18 ENCOUNTER — Ambulatory Visit
Admission: RE | Admit: 2020-10-18 | Discharge: 2020-10-18 | Disposition: A | Discharge status: Home / Self Care | Payer: Medicare Other | Source: Ambulatory Visit | Attending: Radiation Oncology | Admitting: Radiation Oncology

## 2020-10-18 DIAGNOSIS — Z170 Estrogen receptor positive status [ER+]: Secondary | ICD-10-CM | POA: Diagnosis not present

## 2020-10-18 DIAGNOSIS — Z510 Encounter for antineoplastic radiation therapy: Secondary | ICD-10-CM | POA: Diagnosis not present

## 2020-10-18 DIAGNOSIS — C50411 Malignant neoplasm of upper-outer quadrant of right female breast: Secondary | ICD-10-CM | POA: Diagnosis not present

## 2020-10-21 ENCOUNTER — Ambulatory Visit
Admission: RE | Admit: 2020-10-21 | Discharge: 2020-10-21 | Disposition: A | Discharge status: Home / Self Care | Payer: Medicare Other | Source: Ambulatory Visit | Attending: Radiation Oncology | Admitting: Radiation Oncology

## 2020-10-21 DIAGNOSIS — Z510 Encounter for antineoplastic radiation therapy: Secondary | ICD-10-CM | POA: Diagnosis not present

## 2020-10-21 DIAGNOSIS — C50411 Malignant neoplasm of upper-outer quadrant of right female breast: Secondary | ICD-10-CM | POA: Diagnosis not present

## 2020-10-21 DIAGNOSIS — Z170 Estrogen receptor positive status [ER+]: Secondary | ICD-10-CM | POA: Diagnosis not present

## 2020-10-22 ENCOUNTER — Ambulatory Visit
Admission: RE | Admit: 2020-10-22 | Discharge: 2020-10-22 | Disposition: A | Discharge status: Home / Self Care | Payer: Medicare Other | Source: Ambulatory Visit | Attending: Radiation Oncology | Admitting: Radiation Oncology

## 2020-10-22 DIAGNOSIS — C50411 Malignant neoplasm of upper-outer quadrant of right female breast: Secondary | ICD-10-CM | POA: Diagnosis not present

## 2020-10-22 DIAGNOSIS — Z170 Estrogen receptor positive status [ER+]: Secondary | ICD-10-CM | POA: Diagnosis not present

## 2020-10-22 DIAGNOSIS — Z510 Encounter for antineoplastic radiation therapy: Secondary | ICD-10-CM | POA: Diagnosis not present

## 2020-10-23 ENCOUNTER — Ambulatory Visit
Admission: RE | Admit: 2020-10-23 | Discharge: 2020-10-23 | Disposition: A | Discharge status: Home / Self Care | Payer: Medicare Other | Source: Ambulatory Visit | Attending: Radiation Oncology | Admitting: Radiation Oncology

## 2020-10-23 DIAGNOSIS — Z510 Encounter for antineoplastic radiation therapy: Secondary | ICD-10-CM | POA: Diagnosis not present

## 2020-10-23 DIAGNOSIS — Z170 Estrogen receptor positive status [ER+]: Secondary | ICD-10-CM | POA: Diagnosis not present

## 2020-10-23 DIAGNOSIS — C50411 Malignant neoplasm of upper-outer quadrant of right female breast: Secondary | ICD-10-CM | POA: Diagnosis not present

## 2020-10-24 ENCOUNTER — Inpatient Hospital Stay: Payer: Medicare Other

## 2020-10-24 ENCOUNTER — Ambulatory Visit
Admission: RE | Admit: 2020-10-24 | Discharge: 2020-10-24 | Disposition: A | Discharge status: Home / Self Care | Payer: Medicare Other | Source: Ambulatory Visit | Attending: Radiation Oncology | Admitting: Radiation Oncology

## 2020-10-24 DIAGNOSIS — C50411 Malignant neoplasm of upper-outer quadrant of right female breast: Secondary | ICD-10-CM

## 2020-10-24 DIAGNOSIS — Z170 Estrogen receptor positive status [ER+]: Secondary | ICD-10-CM | POA: Diagnosis not present

## 2020-10-24 DIAGNOSIS — Z510 Encounter for antineoplastic radiation therapy: Secondary | ICD-10-CM | POA: Diagnosis not present

## 2020-10-24 LAB — CBC
HCT: 30.6 % — ABNORMAL LOW (ref 36.0–46.0)
Hemoglobin: 9.9 g/dL — ABNORMAL LOW (ref 12.0–15.0)
MCH: 31.3 pg (ref 26.0–34.0)
MCHC: 32.4 g/dL (ref 30.0–36.0)
MCV: 96.8 fL (ref 80.0–100.0)
Platelets: 138 10*3/uL — ABNORMAL LOW (ref 150–400)
RBC: 3.16 MIL/uL — ABNORMAL LOW (ref 3.87–5.11)
RDW: 12.6 % (ref 11.5–15.5)
WBC: 3.4 10*3/uL — ABNORMAL LOW (ref 4.0–10.5)
nRBC: 0 % (ref 0.0–0.2)

## 2020-10-25 ENCOUNTER — Ambulatory Visit
Admission: RE | Admit: 2020-10-25 | Discharge: 2020-10-25 | Disposition: A | Discharge status: Home / Self Care | Payer: Medicare Other | Source: Ambulatory Visit | Attending: Radiation Oncology | Admitting: Radiation Oncology

## 2020-10-25 DIAGNOSIS — C50411 Malignant neoplasm of upper-outer quadrant of right female breast: Secondary | ICD-10-CM | POA: Diagnosis not present

## 2020-10-25 DIAGNOSIS — Z170 Estrogen receptor positive status [ER+]: Secondary | ICD-10-CM | POA: Diagnosis not present

## 2020-10-25 DIAGNOSIS — Z510 Encounter for antineoplastic radiation therapy: Secondary | ICD-10-CM | POA: Diagnosis not present

## 2020-10-28 ENCOUNTER — Ambulatory Visit
Admission: RE | Admit: 2020-10-28 | Discharge: 2020-10-28 | Disposition: A | Discharge status: Home / Self Care | Payer: Medicare Other | Source: Ambulatory Visit | Attending: Radiation Oncology | Admitting: Radiation Oncology

## 2020-10-28 DIAGNOSIS — C50411 Malignant neoplasm of upper-outer quadrant of right female breast: Secondary | ICD-10-CM | POA: Diagnosis not present

## 2020-10-28 DIAGNOSIS — Z510 Encounter for antineoplastic radiation therapy: Secondary | ICD-10-CM | POA: Diagnosis not present

## 2020-10-28 DIAGNOSIS — Z170 Estrogen receptor positive status [ER+]: Secondary | ICD-10-CM | POA: Diagnosis not present

## 2020-10-29 ENCOUNTER — Ambulatory Visit
Admission: RE | Admit: 2020-10-29 | Discharge: 2020-10-29 | Disposition: A | Discharge status: Home / Self Care | Payer: Medicare Other | Source: Ambulatory Visit | Attending: Radiation Oncology | Admitting: Radiation Oncology

## 2020-10-29 DIAGNOSIS — Z170 Estrogen receptor positive status [ER+]: Secondary | ICD-10-CM | POA: Diagnosis not present

## 2020-10-29 DIAGNOSIS — Z510 Encounter for antineoplastic radiation therapy: Secondary | ICD-10-CM | POA: Insufficient documentation

## 2020-10-29 DIAGNOSIS — C50411 Malignant neoplasm of upper-outer quadrant of right female breast: Secondary | ICD-10-CM | POA: Insufficient documentation

## 2020-10-30 ENCOUNTER — Ambulatory Visit
Admission: RE | Admit: 2020-10-30 | Discharge: 2020-10-30 | Disposition: A | Discharge status: Home / Self Care | Payer: Medicare Other | Source: Ambulatory Visit | Attending: Radiation Oncology | Admitting: Radiation Oncology

## 2020-10-30 DIAGNOSIS — Z170 Estrogen receptor positive status [ER+]: Secondary | ICD-10-CM | POA: Diagnosis not present

## 2020-10-30 DIAGNOSIS — C50411 Malignant neoplasm of upper-outer quadrant of right female breast: Secondary | ICD-10-CM | POA: Diagnosis not present

## 2020-10-30 DIAGNOSIS — Z510 Encounter for antineoplastic radiation therapy: Secondary | ICD-10-CM | POA: Diagnosis not present

## 2020-11-06 ENCOUNTER — Other Ambulatory Visit: Payer: Self-pay | Admitting: Oncology

## 2020-12-10 ENCOUNTER — Encounter: Payer: Self-pay | Admitting: Oncology

## 2020-12-10 ENCOUNTER — Inpatient Hospital Stay: Payer: Medicare Other | Attending: Oncology

## 2020-12-10 ENCOUNTER — Ambulatory Visit
Admission: RE | Admit: 2020-12-10 | Discharge: 2020-12-10 | Disposition: A | Discharge status: Home / Self Care | Payer: Medicare Other | Source: Ambulatory Visit | Attending: Radiation Oncology | Admitting: Radiation Oncology

## 2020-12-10 ENCOUNTER — Inpatient Hospital Stay (HOSPITAL_BASED_OUTPATIENT_CLINIC_OR_DEPARTMENT_OTHER): Payer: Medicare Other | Admitting: Oncology

## 2020-12-10 ENCOUNTER — Encounter: Payer: Self-pay | Admitting: Radiation Oncology

## 2020-12-10 VITALS — BP 124/71 | HR 79 | Temp 97.7°F | Resp 16 | Ht 65.0 in | Wt 152.9 lb

## 2020-12-10 DIAGNOSIS — Z170 Estrogen receptor positive status [ER+]: Secondary | ICD-10-CM | POA: Diagnosis not present

## 2020-12-10 DIAGNOSIS — Z923 Personal history of irradiation: Secondary | ICD-10-CM | POA: Insufficient documentation

## 2020-12-10 DIAGNOSIS — C50411 Malignant neoplasm of upper-outer quadrant of right female breast: Secondary | ICD-10-CM

## 2020-12-10 DIAGNOSIS — Z79811 Long term (current) use of aromatase inhibitors: Secondary | ICD-10-CM | POA: Insufficient documentation

## 2020-12-10 DIAGNOSIS — Z5181 Encounter for therapeutic drug level monitoring: Secondary | ICD-10-CM

## 2020-12-10 LAB — COMPREHENSIVE METABOLIC PANEL
ALT: 14 U/L (ref 0–44)
AST: 20 U/L (ref 15–41)
Albumin: 4.5 g/dL (ref 3.5–5.0)
Alkaline Phosphatase: 39 U/L (ref 38–126)
Anion gap: 9 (ref 5–15)
BUN: 21 mg/dL (ref 8–23)
CO2: 25 mmol/L (ref 22–32)
Calcium: 9.4 mg/dL (ref 8.9–10.3)
Chloride: 105 mmol/L (ref 98–111)
Creatinine, Ser: 1.02 mg/dL — ABNORMAL HIGH (ref 0.44–1.00)
GFR, Estimated: 58 mL/min — ABNORMAL LOW (ref >60)
Glucose, Bld: 115 mg/dL — ABNORMAL HIGH (ref 70–99)
Potassium: 4.1 mmol/L (ref 3.5–5.1)
Sodium: 139 mmol/L (ref 135–145)
Total Bilirubin: 0.6 mg/dL (ref 0.3–1.2)
Total Protein: 7.3 g/dL (ref 6.5–8.1)

## 2020-12-10 NOTE — Progress Notes (Signed)
Pt having nausea with the anastrozole but changed to night time and it got better. Sometimes has a touch of nausea. She has hot flashes and mood swings since she has been on it.

## 2020-12-10 NOTE — Progress Notes (Signed)
Radiation Oncology Follow up Note  Name: Theresa Ferguson   Date:   12/10/2020 MRN:  [id number] DOB: Jun 25, 1946    This 75 y.o. female presents to the clinic today for 1 month follow-up status post whole breast radiation.  For invasive mammary carcinoma of the right breast  REFERRING PROVIDER: Kelton Pillar, MD  HPI: Patient is a 75 year old female now at 1 month having completed whole breast radiation to her right breast for stage Ia invasive mammary carcinoma..  She is seen today in routine follow-up is doing well skin changes are completely resolved.  She specifically denies breast tenderness cough or bone pain.  She has been started on Arimidex which she is tolerating well.  COMPLICATIONS OF TREATMENT: none  FOLLOW UP COMPLIANCE: keeps appointments   PHYSICAL EXAM:  There were no vitals taken for this visit. Lungs are clear to A&P cardiac examination essentially unremarkable with regular rate and rhythm. No dominant mass or nodularity is noted in either breast in 2 positions examined. Incision is well-healed. No axillary or supraclavicular adenopathy is appreciated. Cosmetic result is excellent.  Well-developed well-nourished patient in NAD. HEENT reveals PERLA, EOMI, discs not visualized.  Oral cavity is clear. No oral mucosal lesions are identified. Neck is clear without evidence of cervical or supraclavicular adenopathy. Lungs are clear to A&P. Cardiac examination is essentially unremarkable with regular rate and rhythm without murmur rub or thrill. Abdomen is benign with no organomegaly or masses noted. Motor sensory and DTR levels are equal and symmetric in the upper and lower extremities. Cranial nerves II through XII are grossly intact. Proprioception is intact. No peripheral adenopathy or edema is identified. No motor or sensory levels are noted. Crude visual fields are within normal range.  RADIOLOGY RESULTS: No current films to review  PLAN: Present time patient is doing well with  no evidence of disease with no complaints 1 month out from whole breast radiation and pleased with her overall progress.  Of asked to see her back in 4 to 5 months for follow-up.  She continues on Arimidex without side effect.  Patient is to call with any concerns.  I would like to take this opportunity to thank you for allowing me to participate in the care of your patient.Noreene Filbert, MD

## 2020-12-11 ENCOUNTER — Encounter: Payer: Self-pay | Admitting: Oncology

## 2020-12-11 NOTE — Progress Notes (Signed)
Hematology/Oncology Consult note [facility]  Telephone:([phone] [phone]  Patient Care Team: Kelton Pillar, MD as PCP - General (Family Medicine)   Name of the patient: Theresa Ferguson  [id number]  10-10-1945   Date of visit: 12/11/20  Diagnosis-stage I right breast cancer  Chief complaint/ Reason for visit-routine follow-up of breast cancer on Arimidex  Heme/Onc history: Patient is a 75 year old female who underwent routine bilateral screening mammogram in October 2021 which showed a possible distortion in the right breast. This was followed by diagnostic mammogram and ultrasound which showed a 1.1 x 1.3 x 1.2 cm mass at the 9:30 position in the right breast. No adenopathy noted in the right axilla. Ultrasound-guided biopsy showed grade 1 ER PR positive HER-2 negative tumor with a Ki-67 of 10%. Patient has met with Dr. Donne Hazel and plan is for a lumpectomy. Based on choosing wisely guidelines sentinel lymph node biopsy was deemed to be not necessary.  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  Grade 2 Ki-67 10% ER 95% PR 95% positive and HER-2 negative by FISH.  Oncotype came back at low risk and patient did not require any adjuvant chemotherapy  Interval history-patient reports having self-limited hot flashes as well as mood swings on Arimidex but is otherwise tolerating it well.  Denies any significant joint pain.  She is healing well from radiation treatment  ECOG PS- 1 Pain scale- 0   Review of systems- Review of Systems  Constitutional: Negative for chills, fever, malaise/fatigue and weight loss.  HENT: Negative for congestion, ear discharge and nosebleeds.   Eyes: Negative for blurred vision.  Respiratory: Negative for cough, hemoptysis, sputum production, shortness of breath and wheezing.   Cardiovascular: Negative for chest pain, palpitations, orthopnea and claudication.  Gastrointestinal:  Negative for abdominal pain, blood in stool, constipation, diarrhea, heartburn, melena, nausea and vomiting.  Genitourinary: Negative for dysuria, flank pain, frequency, hematuria and urgency.  Musculoskeletal: Negative for back pain, joint pain and myalgias.  Skin: Negative for rash.  Neurological: Negative for dizziness, tingling, focal weakness, seizures, weakness and headaches.  Endo/Heme/Allergies: Does not bruise/bleed easily.       Hot flashes  Psychiatric/Behavioral: Negative for depression and suicidal ideas. The patient does not have insomnia.        Allergies  Allergen Reactions  . Codeine   . Latex   . Morphine And Related   . Nickel   . Penicillins      Past Medical History:  Diagnosis Date  . Anemia   . Anxiety   . Arthritis   . Cancer ([place]) 05/2020   right breast IMC  . Complication of anesthesia   . CTS (carpal tunnel syndrome)    bil, by EMG/[place]  . GERD (gastroesophageal reflux disease)   . Hyperlipemia   . Hypertension   . 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 PONV (postoperative nausea and vomiting)   . Radiculopathy of lumbosacral region 02/02/2013  . RLS (restless legs syndrome) 02/05/2015  . Seasonal allergies      Past Surgical History:  Procedure Laterality Date  . BREAST LUMPECTOMY WITH RADIOACTIVE SEED LOCALIZATION Right 08/07/2020   Procedure: RIGHT BREAST LUMPECTOMY WITH RADIOACTIVE SEED LOCALIZATION;  Surgeon: Rolm Bookbinder, MD;  Location: [place];  Service: General;  Laterality: Right;  . FINGER SURGERY Right 1998   [facility]  . kidney stones  1980  . KNEE SURGERY Bilateral    partial right-2008,left full replacement-2006-Dr [facility]  .  LAMINECTOMY     l4-5  . LUMBAR DISC SURGERY      Social History   Socioeconomic History  . Marital status: Married    Spouse name: Fayrene Fearing  . Number of children: 2  . Years of education: College  . Highest education level: Not on file  Occupational History  .  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 Smoking status: Former Smoker    Quit date: 1982    Years since quitting: 40.3  . Smokeless tobacco: Never Used  Vaping Use  . Vaping Use: Never used  Substance and Sexual Activity  . Alcohol use: Yes    Comment: socially  . Drug use: No  . Sexual activity: Not Currently    Birth control/protection: Post-menopausal  Other Topics Concern  . Not on file  Social History Narrative   Patient is married Fayrene Fearing) and lives at home with her husband.   Patient has two adult children.   Patient is retired.   Patient has a college education.   Patient is right-handed.   Patient drinks two cups of coffee daily.   Social Determinants of Health   Financial Resource Strain: Not on file  Food Insecurity: Not on file  Transportation Needs: Not on file  Physical Activity: Not on file  Stress: Not on file  Social Connections: Not on file  Intimate Partner Violence: Not on file    Family History  Problem Relation Age of Onset  . Diabetes Son   . Breast cancer Neg Hx      Current Outpatient Medications:  .  anastrozole (ARIMIDEX) 1 MG tablet, TAKE 1 TABLET BY MOUTH DAILY., Disp: 30 tablet, Rfl: 3 .  calcium carbonate (OS-CAL - DOSED IN MG OF ELEMENTAL CALCIUM) 1250 (500 Ca) MG tablet, Take 1 tablet by mouth., Disp: , Rfl:  .  celecoxib (CELEBREX) 200 MG capsule, Take 200 mg by mouth daily., Disp: , Rfl:  .  cholecalciferol (VITAMIN D3) 25 MCG (1000 UNIT) tablet, Take 1,000 Units by mouth daily., Disp: , Rfl:  .  escitalopram (LEXAPRO) 10 MG tablet, Take 10 mg by mouth daily., Disp: , Rfl:  .  ferrous sulfate 325 (65 FE) MG tablet, Take 325 mg by mouth daily with breakfast., Disp: , Rfl:  .  fluticasone (FLONASE) 50 MCG/ACT nasal spray, Place 1 spray into both nostrils as needed. , Disp: , Rfl:  .  gabapentin (NEURONTIN) 800 MG tablet, Take one by mouth at morning . Lunch and 2 at night.,  Disp: 360 tablet, Rfl: 3 .  lisinopril-hydrochlorothiazide (PRINZIDE,ZESTORETIC) 20-12.5 MG per tablet, Take 1 tablet by mouth daily., Disp: , Rfl:  .  magnesium oxide (MAG-OX) 400 MG tablet, Take 400 mg by mouth daily., Disp: , Rfl:  .  metFORMIN (GLUCOPHAGE) 500 MG tablet, Take 1,000 mg by mouth 2 (two) times daily. At bedtime, Disp: , Rfl:  .  Multiple Vitamin (MULTIVITAMIN) capsule, Take 1 capsule by mouth daily., Disp: , Rfl:  .  NONFORMULARY OR COMPOUNDED ITEM, Amantadine 8%, Baclofen 2%, gabapentin 6%, amitriptyline 4%, bupivacaine 2%, clonidine 2%, Disp: 1 each, Rfl: 11 .  pantoprazole (PROTONIX) 40 MG tablet, Take 40 mg by mouth daily., Disp: , Rfl:  .  rosuvastatin (CRESTOR) 10 MG tablet, Take 10 mg by mouth daily., Disp: , Rfl:  .  vitamin B-12 (CYANOCOBALAMIN) 1000 MCG tablet, Take 1,000 mcg by mouth daily., Disp: , Rfl:  .  aspirin 81 MG tablet, Take 81 mg by mouth daily.  (Patient not taking: Reported on 12/10/2020), Disp: , Rfl:   Physical exam:  Vitals:   12/10/20 1339  BP: 124/71  Pulse: 79  Resp: 16  Temp: 97.7 F (36.5 C)  TempSrc: Tympanic  Weight: 152 lb 14.4 oz (69.4 kg)  Height: $Remove'5\' 5"'gxhhKan$  (1.651 m)   Physical Exam Constitutional:      General: She is not in acute distress. Cardiovascular:     Rate and Rhythm: Normal rate.  Pulmonary:     Effort: Pulmonary effort is normal.  Skin:    General: Skin is warm and dry.  Neurological:     Mental Status: She is alert and oriented to person, place, and time.      CMP Latest Ref Rng & Units 12/10/2020  Glucose 70 - 99 mg/dL 115(H)  BUN 8 - 23 mg/dL 21  Creatinine 0.44 - 1.00 mg/dL 1.02(H)  Sodium 135 - 145 mmol/L 139  Potassium 3.5 - 5.1 mmol/L 4.1  Chloride 98 - 111 mmol/L 105  CO2 22 - 32 mmol/L 25  Calcium 8.9 - 10.3 mg/dL 9.4  Total Protein 6.5 - 8.1 g/dL 7.3  Total Bilirubin 0.3 - 1.2 mg/dL 0.6  Alkaline Phos 38 - 126 U/L 39  AST 15 - 41 U/L 20  ALT 0 - 44 U/L 14   CBC Latest Ref Rng & Units 10/24/2020   WBC 4.0 - 10.5 K/uL 3.4(L)  Hemoglobin 12.0 - 15.0 g/dL 9.9(L)  Hematocrit 36.0 - 46.0 % 30.6(L)  Platelets 150 - 400 K/uL 138(L)      Assessment and plan- Patient is a 75 y.o. female with pathological prognostic stage Ia invasive mammary carcinoma of the right breast pT1 cpNX cM0 ER/PR positive HER-2 negative s/p lumpectomy.   She is here for routine follow-up of breast cancer  on Arimidex  Patient seems to be tolerating Arimidex well without any significant side effects other than mild self-limited hot flashes and mood swings.  Patient would like to continue Arimidex at this time and not switch to any other medication.  She is also taking her calcium and vitamin D.  Her baseline bone density scan did show osteopenia but her 10-year probability of major osteoporotic fracture was less than 20% and hip fracture was less than 3%.  She therefore does not require any bisphosphonates at this time.  I will see her back in 4 months for breast exam   Visit Diagnosis 1. Visit for monitoring Arimidex therapy   2. Malignant neoplasm of upper-outer quadrant of right breast in female, estrogen receptor positive ([place])      Dr. Randa Evens, MD, MPH [facility] [phone] 12/11/2020 10:24 AM

## 2021-02-25 ENCOUNTER — Encounter: Payer: Self-pay | Admitting: Family Medicine

## 2021-02-25 ENCOUNTER — Ambulatory Visit (INDEPENDENT_AMBULATORY_CARE_PROVIDER_SITE_OTHER): Payer: Medicare Other | Admitting: Family Medicine

## 2021-02-25 VITALS — BP 144/82 | HR 73 | Ht 65.5 in | Wt 154.8 lb

## 2021-02-25 DIAGNOSIS — G629 Polyneuropathy, unspecified: Secondary | ICD-10-CM | POA: Diagnosis not present

## 2021-02-25 DIAGNOSIS — G2581 Restless legs syndrome: Secondary | ICD-10-CM

## 2021-02-25 MED ORDER — GABAPENTIN 800 MG PO TABS: ORAL_TABLET | ORAL | 3 refills | Status: DC

## 2021-02-25 NOTE — Progress Notes (Signed)
Fax confirmation received for [facility] for neuropathy  cream.  [phone], [phone] of.

## 2021-02-25 NOTE — Progress Notes (Signed)
PATIENT: Theresa Ferguson DOB: [birth year]  REASON FOR VISIT: follow up HISTORY FROM: patient  Chief Complaint  Patient presents with   Follow-up    RM 2, alone. Here for RLS f/u, pt states last few weeks, she get lightheaded when she stands and body tingles from head to toes.       HISTORY OF PRESENT ILLNESS: 02/25/2021 ALL: Theresa Ferguson returns for RLS follow up. She continues gabapentin 800mg  in am, 800mg  early afternoon and 1600mg  at bedtime. She added alpha lipoic acid supplements last year that have helped. She also uses neuropathy cream as needed. She reports that symptoms are stable. She did have a period of worsening about 3 months ago but feels that pain and discomfort has improved back to baseline. She sleeps well.   She had an abnormal mammogram in 05/2020. 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 No adjuvant chemo, she was started on Arimidex. She is tolerating fairly well but has noticed some new symptoms. She started having hot flashes within a week of starting the medication. Her husband has told her that she is a little more moody than normal. She describes an electrical feeling that shoots through her body from time to time. This sensation is usually associated with feeling a little lightheaded and like she has tunnel vision. Symptoms usually occur when standing, sometimes after sitting but not always. Symptoms last about 30 seconds to a minute and then resolve. No syncopal events. No falls. She had last event yesterday after being outside working in her yard. She was sweating and clothes were soaked. When she went inside to take a break she felt a "shock go through" her body and felt a little lightheaded. After getting inside and resting, symptoms resolved. BP was 106/60. Usually 120's-140/70's. She admits that she does not drink very much water. She denies chest pain or shob. No palpitations. She has follow  up with PCP next month and oncology in August.   02/26/2020 ALL: Theresa Ferguson is a 75 y.o. female here today for follow up for RLS and neuropathy. She is doing ok. She continues to have numbness and tingling of bilateral feet. Symptoms wax and wane. She does still have restless leg symptoms as well. Some days are better than others. Neuropathy cream does seem to help. She has noticed tingling of both hands recently. Symptoms seem to have come about over the past 2-3 months. She is s/p internal fixation with titanium rod placement of right wrist in 10/2018 for fractured wrist. She has also had hand surgery for removal of nodules in the past. She has neck "tenderness" but no significant pain. She is followed by Dr Mardelle Matte. She is very active. PCP follows labs twice a year. She continues iron, B12 and magnesium supplements. Labs were last checked in 09/2019. Last A1C was 6.4.   [facility] in [place] is [employer].   HISTORY: (copied from my note on 02/22/2019)  Theresa Ferguson is a 75 y.o. female here today for follow up for RLS and neuropathy. She continues gabapentin 800mg  in the am and noon and 1600mg  at night. She is also using compounded cream as needed. During the summer months she is able to exercise more and does not need the cream as often. She has a pool at her home that she uses for exercise. She is doing well today and without complaints.   History (copied from [employer] note on  06/14/2018)   Ms. Theresa Ferguson is a 75 year old female with a history of neuropathy and restless leg syndrome.  She returns today for follow-up.  She is taking 800 mg of gabapentin in the morning and at noon and 1600 mg at bedtime.  She reports that she has good days and bad days.  She states that she has about 4 bad days as of this.  She states sometimes been tingling sensation travels up the leg.  She denies any significant changes with her gait or balance.  She reports that she was unable to get the  compounded cream from [facility] because of the cost  She states that her pharmacy does compounded cream and is asking that I sent her prescription there.  She returns today for follow-up.   HISTORY 06/14/17 Theresa Ferguson is a 75 year old female with a history of neuropathy and restless leg syndrome. She returns today for follow-up. She is currently taking gabapentin 800 mg in the morning and at noon and 600 mg in the evening. She reports that some weeks her pain is horrible and other weeks she does not even notice it. She states that she has numbness in the feet that extends to the ankle. She describes her discomfort as a burning tingling sensation in the feet. She describes it as if she is being stung by a bee. She states that she did break her left ankle and just recently got out of her boot. She states that she was coming downstairs and her left hip gave out and she fell. She denies any significant changes to her gait or balance due to her neuropathy. She does not use a cane or walker. She returns today for an evaluation.   REVIEW OF SYSTEMS: Out of a complete 14 system review of symptoms, the patient complains only of the following symptoms, paresthesias, lightheadedness, restless legs, tingling of bilateral hands and nueopathy and all other reviewed systems are negative.  ALLERGIES: Allergies  Allergen Reactions   Codeine    Latex    Morphine And Related    Nickel    Penicillins     HOME MEDICATIONS: Outpatient Medications Prior to Visit  Medication Sig Dispense Refill   anastrozole (ARIMIDEX) 1 MG tablet TAKE 1 TABLET BY MOUTH DAILY. 30 tablet 3   aspirin 81 MG tablet Take 81 mg by mouth daily.     calcium carbonate (OS-CAL - DOSED IN MG OF ELEMENTAL CALCIUM) 1250 (500 Ca) MG tablet Take 1 tablet by mouth.     celecoxib (CELEBREX) 200 MG capsule Take 200 mg by mouth daily.     cholecalciferol (VITAMIN D3) 25 MCG (1000 UNIT) tablet Take 1,000 Units by mouth daily.      escitalopram (LEXAPRO) 10 MG tablet Take 10 mg by mouth daily.     ferrous sulfate 325 (65 FE) MG tablet Take 325 mg by mouth daily with breakfast.     fluticasone (FLONASE) 50 MCG/ACT nasal spray Place 1 spray into both nostrils as needed.      gabapentin (NEURONTIN) 800 MG tablet Take one by mouth at morning . Lunch and 2 at night. 360 tablet 3   lisinopril-hydrochlorothiazide (PRINZIDE,ZESTORETIC) 20-12.5 MG per tablet Take 1 tablet by mouth daily.     magnesium oxide (MAG-OX) 400 MG tablet Take 400 mg by mouth daily.     metFORMIN (GLUCOPHAGE) 500 MG tablet Take 1,000 mg by mouth 2 (two) times daily. At bedtime     Multiple Vitamin (MULTIVITAMIN) capsule Take 1 capsule by  mouth daily.     NONFORMULARY OR COMPOUNDED ITEM Amantadine 8%, Baclofen 2%, gabapentin 6%, amitriptyline 4%, bupivacaine 2%, clonidine 2% 1 each 11   pantoprazole (PROTONIX) 40 MG tablet Take 40 mg by mouth daily.     rosuvastatin (CRESTOR) 10 MG tablet Take 10 mg by mouth daily.     vitamin B-12 (CYANOCOBALAMIN) 1000 MCG tablet Take 1,000 mcg by mouth daily.     No facility-administered medications prior to visit.    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   PONV (postoperative nausea and vomiting)    Radiculopathy of lumbosacral region 02/02/2013   RLS (restless legs syndrome) 02/05/2015   Seasonal allergies     PAST SURGICAL HISTORY: Past Surgical History:  Procedure Laterality Date   BREAST LUMPECTOMY WITH RADIOACTIVE SEED LOCALIZATION Right 08/07/2020   Procedure: RIGHT BREAST LUMPECTOMY WITH RADIOACTIVE SEED LOCALIZATION;  Surgeon: Rolm Bookbinder, MD;  Location: [place];  Service: General;   Laterality: Right;   FINGER SURGERY Right 1998   [facility]   kidney stones  1980   KNEE SURGERY Bilateral    partial right-2008,left full replacement-2006-Dr [facility]   LAMINECTOMY     l4-5   LUMBAR DISC SURGERY      FAMILY HISTORY: Family History  Problem Relation Age of Onset   Diabetes Son    Breast cancer Neg Hx     SOCIAL HISTORY: Social History   Socioeconomic History   Marital status: Married    Spouse name: Jeneen Rinks   Number of children: 2   Years of education: [employer] education level: Not on file  Occupational History   Occupation: retired    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   Patient has two adult children.   Patient is retired.   Patient has a college education.   Patient is right-handed.   Patient drinks two cups of coffee daily.   Social Determinants of Health   Financial Resource Strain: Not on file  Food Insecurity: Not on file  Transportation Needs: Not on file  Physical Activity: Not on file  Stress: Not on file  Social Connections: Not on file  Intimate Partner Violence: Not on file      PHYSICAL EXAM  Vitals:   02/25/21 0926  BP: (!) 144/82  Pulse: 73  Weight: 154 lb 12.8 oz (70.2 kg)  Height: 5' 5.5" (1.664 m)    Body mass index is 25.37 kg/m.  Generalized: Well developed, in no acute distress  Cardiology: normal rate and rhythm, no murmur noted Respiratory: clear to auscultation bilaterally   Neurological examination  Mentation: Alert oriented to time, place, history taking. Follows all commands speech and language fluent Cranial nerve II-XII: Pupils were equal round reactive to light. Extraocular movements were full, visual field were full . Motor: The motor testing reveals 5 over 5 strength of all 4 extremities. Good symmetric motor tone is noted throughout.  Sensory: Sensory testing is intact to soft touch on all 4 extremities. Positive Tinel and Phalen signs. No evidence of extinction is noted.  Coordination: Cerebellar testing reveals good finger-nose-finger and heel-to-shin bilaterally.  Gait and station: Gait is normal. Romberg is negative. No drift is seen.  Reflexes: Deep tendon reflexes are symmetric and normal bilaterally.   DIAGNOSTIC DATA (LABS, IMAGING, TESTING) - I reviewed patient records, labs, notes, testing and imaging myself where available.  No flowsheet data found.   Lab Results  Component Value Date   WBC 3.4 (L) 10/24/2020   HGB 9.9 (L) 10/24/2020   HCT 30.6 (L) 10/24/2020   MCV 96.8 10/24/2020   PLT 138 (L) 10/24/2020      Component Value Date/Time   NA 139 12/10/2020 1307   K 4.1 12/10/2020 1307   K 4.0 09/20/2014 1223   CL 105 12/10/2020 1307   CO2 25 12/10/2020 1307   GLUCOSE 115 (H) 12/10/2020 1307   BUN 21 12/10/2020 1307   CREATININE 1.02 (H) 12/10/2020 1307   CALCIUM 9.4 12/10/2020 1307   PROT 7.3 12/10/2020 1307   ALBUMIN 4.5 12/10/2020 1307   AST 20 12/10/2020 1307   ALT 14 12/10/2020 1307   ALKPHOS 39 12/10/2020 1307   BILITOT 0.6 12/10/2020 1307   GFRNONAA 58 (L) 12/10/2020 1307   No results found for: CHOL, HDL, LDLCALC, LDLDIRECT, TRIG, CHOLHDL No results found for: HGBA1C No results found for: VITAMINB12 No results found for: TSH     ASSESSMENT AND PLAN 75 y.o. year old female  has a past medical history of Anemia, Anxiety, Arthritis, Cancer ([place]) ([id number]), Complication of anesthesia, CTS (carpal tunnel syndrome),  GERD (gastroesophageal reflux disease), Hyperlipemia, Hypertension, Neuropathy in diabetes ([place]) (02/02/2013), PONV (postoperative nausea and vomiting), Radiculopathy of lumbosacral region (02/02/2013), RLS (restless legs syndrome) (02/05/2015), and Seasonal allergies. here with     ICD-10-CM   1. RLS (restless legs syndrome)  G25.81     2. Neuropathy  G62.9       Theresa Ferguson is doing fairly well today from a neuropathy standpoint. She continues gabapentin 800mg  in am, 800mg  early afternoon and 1600mg  at bedtime. And alpha lipoic acid supplements daily. She also uses neuropathy cream as needed. We will continue current treatment plan. She will monitor concerns discussed closely. I am concerned she may be experiencing labile blood pressures and potential dehydration. We have reviewed side effects of anastrozole and gabapentin. She has tolerated gabapentin well. I have advised increasing water intake and consideration of compression stockings. She will follow up closely with PCP and oncology to discuss symptoms. She was encouraged to make position changes slowly. She will follow up annually, sooner if needed. She verbalizes understanding and agreement with this plan.    No orders of the defined types were placed in this encounter.    No orders of the defined types were placed in this encounter.     Debbora Presto, FNP-C 02/25/2021, 9:47 AM [facility] [address] [place], [place] [address] [phone]

## 2021-02-25 NOTE — Patient Instructions (Addendum)
Below is our plan:  We will continue gabapentin and neuropathy cream. Continue alpha lipoic acid. Please let me know if neuropathy worsens at all. Consider compression stocking to help with fluctuating blood pressures with position changes. Follow up closely with PCP next month and oncology in August.   Please make sure you are staying well hydrated. I recommend 50-60 ounces daily. Well balanced diet and regular exercise encouraged. Consistent sleep schedule with 6-8 hours recommended.   Please continue follow up with care team as directed.   Follow up with me in 1 year   You may receive a survey regarding today's visit. I encourage you to leave honest feed back as I do use this information to improve patient care. Thank you for seeing me today!

## 2021-02-28 NOTE — Progress Notes (Signed)
Primary Neurologist is Dr. Brett Fairy, MD. Please mention the primary attending at the beginning of the note.  The patient has been followed for diabetic polyneuropathy since 2013, she has symptoms of RLS related to endocrine/ diabetic neuropathy and spinal DDD, with a tendency to fall. PCP is to follow with renal metabolic results as the patient takes high dose gabapentin.   I agree with the assessment and plan as directed by NP on this visit . I was available for consultation.   Larey Seat, MD

## 2021-03-11 ENCOUNTER — Telehealth: Payer: Self-pay | Admitting: Family Medicine

## 2021-03-11 ENCOUNTER — Other Ambulatory Visit: Payer: Self-pay | Admitting: Neurology

## 2021-03-11 DIAGNOSIS — G2581 Restless legs syndrome: Secondary | ICD-10-CM

## 2021-03-11 DIAGNOSIS — G629 Polyneuropathy, unspecified: Secondary | ICD-10-CM

## 2021-03-11 MED ORDER — NONFORMULARY OR COMPOUNDED ITEM: 11 refills | Status: DC

## 2021-03-11 NOTE — Telephone Encounter (Signed)
Pt states her pharmacy never received the request to refill the NONFORMULARY OR COMPOUNDED ITEM. Pharmacy [facility].

## 2021-03-11 NOTE — Telephone Encounter (Signed)
Printed script for Dr Brett Fairy to sign and will fax to her pharmacy for her.

## 2021-03-11 NOTE — Telephone Encounter (Signed)
[facility] called back stating they do not have a full quantity for the  NONFORMULARY OR COMPOUNDED or directions on how to take it. Please advise.

## 2021-03-11 NOTE — Telephone Encounter (Signed)
Called [facility] and spoke with [place] who transferred me to [place]. Provided qty 240gm, directions: apply 1-2grams to the affected area 3-4 times daily. She is still working out what price will be. May be expensive. Advised if needed, can lower qty to 120gm or 180gm per pt preference/if more cost effective for pt. She verbalized understanding and will call back if any further questions arise.

## 2021-03-21 DIAGNOSIS — D509 Iron deficiency anemia, unspecified: Secondary | ICD-10-CM | POA: Diagnosis not present

## 2021-03-21 DIAGNOSIS — M5136 Other intervertebral disc degeneration, lumbar region: Secondary | ICD-10-CM | POA: Diagnosis not present

## 2021-03-21 DIAGNOSIS — I129 Hypertensive chronic kidney disease with stage 1 through stage 4 chronic kidney disease, or unspecified chronic kidney disease: Secondary | ICD-10-CM | POA: Diagnosis not present

## 2021-03-21 DIAGNOSIS — E1121 Type 2 diabetes mellitus with diabetic nephropathy: Secondary | ICD-10-CM | POA: Diagnosis not present

## 2021-03-21 DIAGNOSIS — C50919 Malignant neoplasm of unspecified site of unspecified female breast: Secondary | ICD-10-CM | POA: Diagnosis not present

## 2021-03-21 DIAGNOSIS — F39 Unspecified mood [affective] disorder: Secondary | ICD-10-CM | POA: Diagnosis not present

## 2021-03-21 DIAGNOSIS — Z1389 Encounter for screening for other disorder: Secondary | ICD-10-CM | POA: Diagnosis not present

## 2021-03-21 DIAGNOSIS — N182 Chronic kidney disease, stage 2 (mild): Secondary | ICD-10-CM | POA: Diagnosis not present

## 2021-03-21 DIAGNOSIS — Z0000 Encounter for general adult medical examination without abnormal findings: Secondary | ICD-10-CM | POA: Diagnosis not present

## 2021-03-21 DIAGNOSIS — E782 Mixed hyperlipidemia: Secondary | ICD-10-CM | POA: Diagnosis not present

## 2021-03-21 DIAGNOSIS — E1149 Type 2 diabetes mellitus with other diabetic neurological complication: Secondary | ICD-10-CM | POA: Diagnosis not present

## 2021-03-25 ENCOUNTER — Telehealth: Payer: Self-pay | Admitting: *Deleted

## 2021-03-25 DIAGNOSIS — N644 Mastodynia: Secondary | ICD-10-CM | POA: Diagnosis not present

## 2021-03-25 DIAGNOSIS — M19011 Primary osteoarthritis, right shoulder: Secondary | ICD-10-CM | POA: Diagnosis not present

## 2021-03-25 DIAGNOSIS — Z853 Personal history of malignant neoplasm of breast: Secondary | ICD-10-CM | POA: Diagnosis not present

## 2021-03-25 DIAGNOSIS — S2241XA Multiple fractures of ribs, right side, initial encounter for closed fracture: Secondary | ICD-10-CM | POA: Diagnosis not present

## 2021-03-25 DIAGNOSIS — R0781 Pleurodynia: Secondary | ICD-10-CM | POA: Diagnosis not present

## 2021-03-25 NOTE — Telephone Encounter (Signed)
Patient called reporting that she went to [place] this morning and was told to contact Dr Janese Banks to see if she needs to be seen in Oncology.for possibe fractured of rib on breast cancer side  Patient Instructions - documented in this encounter  Patient Instructions [name], [place] - 03/25/2021 10:45 AM EDT   Formatting of this note might be different from the original. X-ray report pending - I am concerned about a possible rib fracture.  I do recommend you follow up with oncology with regard to your symptoms and x-ray.  Naprosyn for pain relief. Do not take other NSAIDs while taking this.  Be sure to take deep breaths/cough on occasion.  Follow up if new/worsening symptoms - fever, cough.  To ED if shortness of breath, chest pain, coughing up blood, dizziness.  Electronically signed by [name], PA at 03/25/2021 12:29 PM EDT

## 2021-03-25 NOTE — Telephone Encounter (Signed)
H/o stage 1 breast cancer. Unlikelythat rib fracture was related. Can one of you see her?

## 2021-03-25 NOTE — Telephone Encounter (Signed)
Sure. I will add Balir and Anderson Malta to get her added.   Faythe Casa, NP 03/25/2021 4:10 PM

## 2021-03-26 ENCOUNTER — Inpatient Hospital Stay: Payer: Medicare Other | Attending: Oncology | Admitting: Oncology

## 2021-03-26 ENCOUNTER — Other Ambulatory Visit: Payer: Self-pay

## 2021-03-26 VITALS — BP 140/77 | HR 79 | Temp 97.8°F | Resp 18

## 2021-03-26 DIAGNOSIS — M8580 Other specified disorders of bone density and structure, unspecified site: Secondary | ICD-10-CM | POA: Diagnosis not present

## 2021-03-26 DIAGNOSIS — F419 Anxiety disorder, unspecified: Secondary | ICD-10-CM | POA: Insufficient documentation

## 2021-03-26 DIAGNOSIS — Z170 Estrogen receptor positive status [ER+]: Secondary | ICD-10-CM | POA: Insufficient documentation

## 2021-03-26 DIAGNOSIS — M8468XA Pathological fracture in other disease, other site, initial encounter for fracture: Secondary | ICD-10-CM | POA: Diagnosis not present

## 2021-03-26 DIAGNOSIS — C50411 Malignant neoplasm of upper-outer quadrant of right female breast: Secondary | ICD-10-CM | POA: Insufficient documentation

## 2021-03-26 DIAGNOSIS — Z79899 Other long term (current) drug therapy: Secondary | ICD-10-CM | POA: Insufficient documentation

## 2021-03-26 DIAGNOSIS — F411 Generalized anxiety disorder: Secondary | ICD-10-CM | POA: Diagnosis not present

## 2021-03-26 DIAGNOSIS — R0781 Pleurodynia: Secondary | ICD-10-CM

## 2021-03-26 DIAGNOSIS — Z79811 Long term (current) use of aromatase inhibitors: Secondary | ICD-10-CM | POA: Insufficient documentation

## 2021-03-26 MED ORDER — ESCITALOPRAM OXALATE 20 MG PO TABS: 20.0000 mg | ORAL_TABLET | Freq: Every day | ORAL | 0 refills | Status: DC

## 2021-03-26 NOTE — Progress Notes (Signed)
Symptom Management Consult note [facility]  [phone] [phone]  Patient Care Team: Kelton Pillar, MD as PCP - General (Family Medicine)   Name of the patient: Theresa Ferguson  [id number]  05-06-1946   Date of visit: 03/26/2021  Diagnosis: Breast cancer  Chief Complaint: Right Sided Rib Fracture  Current Treatment: Arimedex  Oncology History:  Oncology History  Malignant neoplasm of upper-outer quadrant of right breast in female, estrogen receptor positive ([place])  07/02/2020 Cancer Staging   Staging form: Breast, AJCC 8th Edition - Clinical stage from 07/02/2020: Stage IA (cT1c, cN0, cM0, G1, ER+, PR+, HER2-) - Signed by Gardenia Phlegm, NP on 07/10/2020    07/10/2020 Initial Diagnosis   Malignant neoplasm of upper-outer quadrant of right breast in female, estrogen receptor positive ([place])    08/13/2020 Cancer Staging   Staging form: Breast, AJCC 8th Edition - Pathologic stage from 08/13/2020: Stage Unknown (pT1c, pNX, cM0, G2, ER+, PR+, HER2-) - Signed by Sindy Guadeloupe, MD on 08/19/2020      Subjective Data:  ECOG: 0 - Asymptomatic  HISTORY OF PRESENT ILLNESS-  Ms. Chaires is a 75 year old female with PMH significant for anxiety, arthritis, osteopenia, GERD, HTN, HLD who was recently diagnosed with stage Ia invasive right breast cancer.  She is status post whole breast radiation with Dr. Donella Stade and on Arimidex.  Interval history-  She presents to [facility] today after being seen at [facility] on 07/26 for right sided rib pain that radiated to her right breast. Xrays obtained by PA, confirmed a R rib fracture. She was informed to follow up with her Oncology team since the pain was on the same side as her previous breast cancer. She was given Naproxen for pain, and educated about deep breathing exercises.   She reports reaching into her washing machine causing right sided pain on the lateral aspect of her right chest, at  approximately the 5th/6th rib. Reports tenderness, with progressively increasing pain. She denied chest pain/shortness of breath after onset but was concerned about cancer recurrence so she presented to the [facility].  Patient also mentions concerns about the side effects of Arimidex including hot flashes, mood swings and depression.  Review of Systems  Constitutional:  Negative for fever, malaise/fatigue and weight loss.  HENT:  Negative for congestion and hearing loss.   Eyes:  Negative for blurred vision and double vision.  Respiratory:  Negative for cough, shortness of breath, wheezing and stridor.   Cardiovascular:  Negative for chest pain and palpitations.  Gastrointestinal:  Negative for abdominal pain, constipation, diarrhea, nausea and vomiting.  Genitourinary:  Negative for frequency and urgency.  Musculoskeletal:  Negative for falls and myalgias.       Rib pain, right side with radiation to right breast  Skin:  Negative for rash.  Neurological:  Negative for dizziness, tingling and headaches.  Endo/Heme/Allergies:  Does not bruise/bleed easily.  Psychiatric/Behavioral:  Positive for depression. The patient is nervous/anxious. The patient does not have insomnia.     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   PONV (postoperative nausea and vomiting)  Radiculopathy of lumbosacral region 02/02/2013   RLS (restless legs syndrome) 02/05/2015   Seasonal allergies    Past Surgical History:  Procedure Laterality Date   BREAST LUMPECTOMY WITH RADIOACTIVE SEED LOCALIZATION Right 08/07/2020   Procedure: RIGHT BREAST LUMPECTOMY WITH RADIOACTIVE SEED LOCALIZATION;  Surgeon: Rolm Bookbinder, MD;   Location: [place];  Service: General;  Laterality: Right;   FINGER SURGERY Right 1998   [facility]   kidney stones  1980   KNEE SURGERY Bilateral    partial right-2008,left full replacement-2006-Dr [facility]   LAMINECTOMY     l4-5   LUMBAR DISC SURGERY     Family History  Problem Relation Age of Onset   Diabetes Son    Breast cancer Neg Hx     Allergies  Allergen Reactions   Codeine    Latex    Morphine And Related    Nickel    Penicillins    Current Outpatient Medications  Medication Instructions   anastrozole (ARIMIDEX) 1 MG tablet TAKE 1 TABLET BY MOUTH DAILY.   aspirin 81 mg, Oral, Daily   calcium carbonate (OS-CAL - DOSED IN MG OF ELEMENTAL CALCIUM) 1250 (500 Ca) MG tablet 1 tablet, Oral   celecoxib (CELEBREX) 200 mg, Daily   cholecalciferol (VITAMIN D3) 1,000 Units, Oral, Daily   escitalopram (LEXAPRO) 10 mg, Daily   ferrous sulfate 325 mg, Oral, Daily with breakfast   fluticasone (FLONASE) 50 MCG/ACT nasal spray 1 spray, Each Nare, As needed   gabapentin (NEURONTIN) 800 MG tablet Take one by mouth at morning . Lunch and 2 at night.   lisinopril-hydrochlorothiazide (PRINZIDE,ZESTORETIC) 20-12.5 MG per tablet 1 tablet, Daily   magnesium oxide (MAG-OX) 400 mg, Oral, Daily   metFORMIN (GLUCOPHAGE) 1,000 mg, Oral, 2 times daily, At bedtime    Multiple Vitamin (MULTIVITAMIN) capsule 1 capsule, Daily   NONFORMULARY OR COMPOUNDED ITEM Amantadine 8%, Baclofen 2%, gabapentin 6%, amitriptyline 4%, bupivacaine 2%, clonidine 2%Amantadine 8%, Baclofen 2%, gabapentin 6%, amitriptyline 4%, bupivacaine 2%, clonidine 2%   pantoprazole (PROTONIX) 40 mg, Daily   rosuvastatin (CRESTOR) 10 mg, Daily   vitamin B-12 (CYANOCOBALAMIN) 1,000 mcg, Oral, Daily   Objective  Vitals:   03/26/21 1103  BP: 140/77  Pulse: 79  Resp: 18  Temp: 97.8 F (36.6 C)  SpO2: 100%    There is no height or weight on file to calculate BMI.  Physical Exam Vitals and nursing note  reviewed.  HENT:     Head: Normocephalic and atraumatic.     Nose: No congestion.     Mouth/Throat:     Mouth: Mucous membranes are moist.     Pharynx: Oropharynx is clear.  Eyes:     General:        Right eye: No discharge.        Left eye: No discharge.     Extraocular Movements: Extraocular movements intact.     Pupils: Pupils are equal, round, and reactive to light.  Cardiovascular:     Rate and Rhythm: Normal rate and regular rhythm.     Pulses: Normal pulses.     Heart sounds: No murmur heard. Pulmonary:     Effort: Pulmonary effort is normal.  Abdominal:     General: Bowel sounds are normal. There is no distension.     Tenderness: There is no abdominal tenderness. There is no guarding.  Musculoskeletal:        General: No swelling or deformity.     Right lower leg: No edema.  Left lower leg: No edema.  Skin:    General: Skin is warm and dry.     Capillary Refill: Capillary refill takes less than 2 seconds.  Neurological:     Mental Status: She is alert and oriented to person, place, and time.  Psychiatric:        Mood and Affect: Mood normal.        Thought Content: Thought content normal.   ASSESSMENT & PLAN: Mrs. Shiplett presents today for right rib fracture.  She is on Arimidex.  Rib Fracture Right sided rib fracture confirmed with Xray completed yesterday at [facility] She attributes the fracture to reaching over into her large washing machine drum "over and over" last week while doing laundry Patient was started on Naproxen She reports improvement of pain with this medication Coughing and deep breathing encouraged again to prevent pneumonia/pulmonary complications  Osteopenic T-Score -1.1 on Last Bone Density (05/31/2020) Takes calcium and Vitamin D  Weight bearing exercises encouraged: walking, swimming Repeat Bone Density secondary to pathologic rib fracture Stop Arimedex Discussion of symptom improvement/new medication with Dr. Janese Banks in 2 weeks at  regular follow up appointment  Anxiety/Sadness  Patient reports worsening anxiety over fear of cancer recurrence  She reports mood swings, sadness, lack of interest in normal activities These changes could be due to her AI medication, however she has been on her Lexapro $RemoveBef'10mg'FaxoQTuQZg$  "for years and years" Therefore, I offered a trial of increasing this medication to aid with mood/anxiety Patient was agreeable Prescription for $RemoveBeforeDE'20mg'QHjuJUxQcSmixfr$  Lexapro PO daily was sent to patients pharmacy Educated about Serotonin Syndrome, not stopping the medication without talking to providers, and consideration of taking this medication at night if she notices increased drowsiness.  Disposition- Recommend she stop Arimidex until her visit with Dr. Janese Banks in 2 weeks. Increase Lexapro from 10 to 20 mg daily. We will touch base with Dr. Janese Banks to see if repeat DEXA is needed-last was in October 2021. Will see if her symptoms improve off Arimidex.  The patient's diagnosis, an outline of the further diagnostic and laboratory studies which will be required, the recommendation for surgery, and alternatives were discussed with her and her accompanying family members.  All questions were answered to their satisfaction.  I personally had a face to face interaction and evaluated the patient jointly with the NP Student, Mrs. Benedetto Goad.  I have reviewed her history and available records and have performed the key portions of the physical exam including general, HEENT, abdominal exam, pelvic exam with my findings confirming those documented above by the APP student.  I have discussed the case with the APP student and the patient.  I agree with the above documentation, assessment and plan which was fully formulated by me.  Counseling was completed by me.   Benedetto Goad, Student FNP  I spent 25 minutes dedicated to the care of this patient (face-to-face and non-face-to-face) on the date of the encounter to include what is described in the  assessment and plan.  Faythe Casa, NP 03/27/2021 8:55 AM

## 2021-03-27 ENCOUNTER — Encounter: Payer: Self-pay | Admitting: Family Medicine

## 2021-04-01 ENCOUNTER — Other Ambulatory Visit: Payer: Self-pay | Admitting: Oncology

## 2021-04-11 ENCOUNTER — Inpatient Hospital Stay: Payer: Medicare Other | Attending: Oncology | Admitting: Oncology

## 2021-04-11 ENCOUNTER — Other Ambulatory Visit: Payer: Self-pay

## 2021-04-11 ENCOUNTER — Inpatient Hospital Stay: Payer: Medicare Other

## 2021-04-11 VITALS — BP 150/72 | HR 79 | Temp 97.9°F | Resp 20 | Wt 154.2 lb

## 2021-04-11 DIAGNOSIS — D696 Thrombocytopenia, unspecified: Secondary | ICD-10-CM

## 2021-04-11 DIAGNOSIS — D509 Iron deficiency anemia, unspecified: Secondary | ICD-10-CM | POA: Insufficient documentation

## 2021-04-11 DIAGNOSIS — E611 Iron deficiency: Secondary | ICD-10-CM

## 2021-04-11 DIAGNOSIS — D649 Anemia, unspecified: Secondary | ICD-10-CM | POA: Diagnosis not present

## 2021-04-11 DIAGNOSIS — C50411 Malignant neoplasm of upper-outer quadrant of right female breast: Secondary | ICD-10-CM

## 2021-04-11 DIAGNOSIS — Z170 Estrogen receptor positive status [ER+]: Secondary | ICD-10-CM | POA: Diagnosis not present

## 2021-04-11 DIAGNOSIS — R5383 Other fatigue: Secondary | ICD-10-CM | POA: Diagnosis not present

## 2021-04-11 LAB — CBC WITH DIFFERENTIAL/PLATELET
Abs Immature Granulocytes: 0 10*3/uL (ref 0.00–0.07)
Basophils Absolute: 0 10*3/uL (ref 0.0–0.1)
Basophils Relative: 0 %
Eosinophils Absolute: 0 10*3/uL (ref 0.0–0.5)
Eosinophils Relative: 1 %
HCT: 31.5 % — ABNORMAL LOW (ref 36.0–46.0)
Hemoglobin: 10.1 g/dL — ABNORMAL LOW (ref 12.0–15.0)
Immature Granulocytes: 0 %
Lymphocytes Relative: 31 %
Lymphs Abs: 0.9 10*3/uL (ref 0.7–4.0)
MCH: 31.2 pg (ref 26.0–34.0)
MCHC: 32.1 g/dL (ref 30.0–36.0)
MCV: 97.2 fL (ref 80.0–100.0)
Monocytes Absolute: 0.4 10*3/uL (ref 0.1–1.0)
Monocytes Relative: 13 %
Neutro Abs: 1.7 10*3/uL (ref 1.7–7.7)
Neutrophils Relative %: 55 %
Platelets: 149 10*3/uL — ABNORMAL LOW (ref 150–400)
RBC: 3.24 MIL/uL — ABNORMAL LOW (ref 3.87–5.11)
RDW: 13.3 % (ref 11.5–15.5)
WBC: 3 10*3/uL — ABNORMAL LOW (ref 4.0–10.5)
nRBC: 0 % (ref 0.0–0.2)

## 2021-04-11 LAB — IRON AND TIBC
Iron: 58 ug/dL (ref 28–170)
Saturation Ratios: 16 % (ref 10.4–31.8)
TIBC: 353 ug/dL (ref 250–450)
UIBC: 295 ug/dL

## 2021-04-11 LAB — VITAMIN B12: Vitamin B-12: 563 pg/mL (ref 180–914)

## 2021-04-11 LAB — FERRITIN: Ferritin: 24 ng/mL (ref 11–307)

## 2021-04-11 LAB — FOLATE: Folate: 7.7 ng/mL (ref >5.9)

## 2021-04-13 ENCOUNTER — Encounter: Payer: Self-pay | Admitting: Oncology

## 2021-04-13 NOTE — Progress Notes (Signed)
Hematology/Oncology Consult note [facility]  [phone] [phone]  Patient Care Team: Kelton Pillar, MD as PCP - General (Family Medicine)   Name of the patient: Theresa Ferguson  [id number]  Jun 03, 1946   Date of visit: 04/13/21  Diagnosis-stage I right breast cancer Normocytic anemia  Chief complaint/ Reason for visit-routine follow-up of right breast cancer to discuss hormone therapy options Discussed work-up for anemia  Heme/Onc history: Patient is a 75 year old female who underwent routine bilateral screening mammogram in October 2021 which showed a possible distortion in the right breast.  This was followed by diagnostic mammogram and ultrasound which showed a 1.1 x 1.3 x 1.2 cm mass at the 9:30 position in the right breast.  No adenopathy noted in the right axilla.  Ultrasound-guided biopsy showed grade 1 ER PR positive HER-2 negative tumor with a Ki-67 of 10%.  Patient has met with Dr. Donne Hazel and plan is for a lumpectomy.  Based on choosing wisely guidelines sentinel lymph node biopsy was deemed to be not necessary.    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  Grade 2 Ki-67 10% ER 95% PR 95% positive and HER-2 negative by FISH.  Oncotype came back at low risk and patient did not require any adjuvant chemotherapy.  She completed adjuvant radiation treatment and started Arimidex in March 2022.  She held it in July 2020 due to side effects  Interval history-patient was experiencing significant side effects from Arimidex including excessive fatigue as well as generalized body aches.  She was not able to go out and enjoy her life the way she used to before starting Arimidex and spent most of her time at home which was affecting her quality of life.  After stopping Arimidex for 2 weeks patient reports significant improvement in her symptoms and almost feels back to her baseline.  Patient is also  concerned about her recent blood work done at her PCPs office which showed that she was anemic.  She denies any blood loss in her stool or urine.  ECOG PS- 1 Pain scale- 0   Review of systems- Review of Systems  Constitutional:  Positive for malaise/fatigue. Negative for chills, fever and weight loss.  HENT:  Negative for congestion, ear discharge and nosebleeds.   Eyes:  Negative for blurred vision.  Respiratory:  Negative for cough, hemoptysis, sputum production, shortness of breath and wheezing.   Cardiovascular:  Negative for chest pain, palpitations, orthopnea and claudication.  Gastrointestinal:  Negative for abdominal pain, blood in stool, constipation, diarrhea, heartburn, melena, nausea and vomiting.  Genitourinary:  Negative for dysuria, flank pain, frequency, hematuria and urgency.  Musculoskeletal:  Negative for back pain, joint pain and myalgias.  Skin:  Negative for rash.  Neurological:  Negative for dizziness, tingling, focal weakness, seizures, weakness and headaches.  Endo/Heme/Allergies:  Does not bruise/bleed easily.  Psychiatric/Behavioral:  Negative for depression and suicidal ideas. The patient does not have insomnia.      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   PONV (postoperative nausea and vomiting)    Radiculopathy of lumbosacral region 02/02/2013   RLS (restless legs syndrome) 02/05/2015   Seasonal allergies      Past Surgical History:  Procedure Laterality Date   BREAST LUMPECTOMY WITH RADIOACTIVE SEED LOCALIZATION Right 08/07/2020   Procedure: RIGHT BREAST LUMPECTOMY WITH  RADIOACTIVE SEED LOCALIZATION;  Surgeon: Rolm Bookbinder, MD;  Location: [place];  Service: General;  Laterality: Right;   FINGER SURGERY Right 1998   [facility]   kidney stones  1980   KNEE SURGERY Bilateral    partial right-2008,left full replacement-2006-Dr [facility]   LAMINECTOMY     l4-5   LUMBAR DISC SURGERY      Social History   Socioeconomic History   Marital status: Married    Spouse name: Jeneen Rinks   Number of children: 2   Years of education: College   Highest education level: Not on file  Occupational History   Occupation: retired    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   Patient has two adult children.   Patient is retired.   Patient has a college education.   Patient is right-handed.   Patient drinks two cups of coffee daily.   Social Determinants of Health   Financial Resource Strain: Not on file  Food Insecurity: Not on file  Transportation Needs: Not on file  Physical Activity: Not on file  Stress: Not on file  Social Connections: Not on file  Intimate Partner Violence: Not on file    Family History  Problem Relation Age of Onset   Diabetes Son    Breast cancer Neg Hx      Current Outpatient Medications:    anastrozole (ARIMIDEX) 1 MG tablet, TAKE 1 TABLET BY MOUTH DAILY, Disp: 30 tablet, Rfl: 3   calcium carbonate (OS-CAL - DOSED IN MG OF ELEMENTAL CALCIUM) 1250 (500 Ca) MG tablet, Take 1 tablet by mouth., Disp: , Rfl:     cholecalciferol (VITAMIN D3) 25 MCG (1000 UNIT) tablet, Take 1,000 Units by mouth daily., Disp: , Rfl:    escitalopram (LEXAPRO) 20 MG tablet, Take 1 tablet (20 mg total) by mouth daily., Disp: 90 tablet, Rfl: 0   ferrous sulfate 325 (65 FE) MG tablet, Take 325 mg by mouth daily with breakfast., Disp: , Rfl:    gabapentin (NEURONTIN) 800 MG tablet, Take one by mouth at morning . Lunch and 2 at night., Disp: 360 tablet, Rfl: 3   lisinopril-hydrochlorothiazide (PRINZIDE,ZESTORETIC) 20-12.5 MG per tablet, Take 1 tablet by mouth daily., Disp: , Rfl:    magnesium oxide (MAG-OX) 400 MG tablet, Take 400 mg by mouth daily., Disp: , Rfl:    metFORMIN (GLUCOPHAGE) 500 MG tablet, Take 1,000 mg by mouth 2 (two) times daily. At bedtime, Disp: , Rfl:    Multiple Vitamin (MULTIVITAMIN) capsule, Take 1 capsule by  mouth daily., Disp: , Rfl:    naproxen (NAPROSYN) 500 MG tablet, Take 500 mg by mouth 2 (two) times daily., Disp: , Rfl:    NONFORMULARY OR COMPOUNDED ITEM, Amantadine 8%, Baclofen 2%, gabapentin 6%, amitriptyline 4%, bupivacaine 2%, clonidine 2%Amantadine 8%, Baclofen 2%, gabapentin 6%, amitriptyline 4%, bupivacaine 2%, clonidine 2%, Disp: 1 each, Rfl: 11   pantoprazole (PROTONIX) 40 MG tablet, Take 40 mg by mouth daily., Disp: , Rfl:    rosuvastatin (CRESTOR) 10 MG tablet, Take 10 mg by mouth daily., Disp: , Rfl:    vitamin B-12 (CYANOCOBALAMIN) 1000 MCG tablet, Take 1,000 mcg by mouth daily., Disp: , Rfl:    aspirin 81 MG tablet, Take 81 mg by mouth daily. (Patient not taking: No sig reported), Disp: , Rfl:    celecoxib (CELEBREX) 200 MG capsule, Take 200 mg by mouth daily. (Patient not taking: No sig reported), Disp: , Rfl:    fluticasone (FLONASE) 50 MCG/ACT nasal spray, Place 1 spray into both nostrils as needed.  (Patient not taking: No sig reported), Disp: , Rfl:   Physical exam:  Vitals:   04/11/21 1313  BP: (!) 150/72  Pulse: 79  Resp: 20  Temp: 97.9 F (36.6 C)  TempSrc: Tympanic  SpO2:  99%  Weight: 154 lb 3.2 oz (69.9 kg)   Physical Exam Constitutional:      General: She is not in acute distress. Cardiovascular:     Rate and Rhythm: Normal rate and regular rhythm.     Heart sounds: Normal heart sounds.  Pulmonary:     Effort: Pulmonary effort is normal.     Breath sounds: Normal breath sounds.  Abdominal:     General: Bowel sounds are normal.     Palpations: Abdomen is soft.  Skin:    General: Skin is warm and dry.  Neurological:     Mental Status: She is alert and oriented to person, place, and time.   Breast exam was performed in seated and lying down position. Patient is status post right lumpectomy with a well-healed surgical scar. No evidence of any palpable masses. No evidence of axillary adenopathy. No evidence of any palpable masses or lumps in the left breast. No evidence of leftt axillary adenopathy   CMP Latest Ref Rng & Units 12/10/2020  Glucose 70 - 99 mg/dL 115(H)  BUN 8 - 23 mg/dL 21  Creatinine 0.44 - 1.00 mg/dL 1.02(H)  Sodium 135 - 145 mmol/L 139  Potassium 3.5 - 5.1 mmol/L 4.1  Chloride 98 - 111 mmol/L 105  CO2 22 - 32 mmol/L 25  Calcium 8.9 - 10.3 mg/dL 9.4  Total Protein 6.5 - 8.1 g/dL 7.3  Total Bilirubin 0.3 - 1.2 mg/dL 0.6  Alkaline Phos 38 - 126 U/L 39  AST 15 - 41 U/L 20  ALT 0 - 44 U/L 14   CBC Latest Ref Rng & Units 04/11/2021  WBC 4.0 - 10.5 K/uL 3.0(L)  Hemoglobin 12.0 - 15.0 g/dL 10.1(L)  Hematocrit 36.0 - 46.0 % 31.5(L)  Platelets 150 - 400 K/uL 149(L)     Assessment and plan- Patient is a 75 y.o. female who is here for follow up of following issues:  Of right breast cancer: Status postlumpectomy and adjuvant radiation treatment.  Patient was on Arimidex for about 4 months.  However she could not tolerate it well.  She reports significant fatigue as well as arthralgias and limitation in her day-to-day activities.  Due to these reason she stopped taking her Arimidex about 2  to 3 weeks ago and reports significant  improvement in her symptoms.  We did calculate her predicted score which shows the benefit of hormone therapy for stage I breast cancer and in her case the absolute survival benefit with adjuvant endocrine therapy was about 2%.  Patient does not wish to try any alternative AI's or tamoxifen at this time and would like to stop hormone therapy altogether. 2.  Normocytic anemia:Patient's hemoglobin has been around 10 since January of this year.  She also has evidence of mild thrombocytopenia and some leukopenia with borderline neutropenia.  ANC today was 1.7.  I will hold off on a bone marrow biopsy at this time but if there is a continued downward trend in her white cell count we could consider that.  Her labs today do show evidence of iron deficiency with a ferritin level of 24 and iron saturation of 16%.  We will reach out to her to see if she would like to proceed with IV iron versus trial of oral iron.  Repeat CBC ferritin and iron studies in 3 months followed by video visit with me.   Visit Diagnosis 1. Malignant neoplasm of upper-outer quadrant of right breast in female, estrogen receptor positive ([place])   2. Normocytic anemia   3. Thrombocytopenia, unspecified ([place])       Dr. Randa Evens, MD, MPH [facility] at [facility] [phone] 04/13/2021 9:56 AM

## 2021-04-15 ENCOUNTER — Other Ambulatory Visit: Payer: Self-pay | Admitting: Oncology

## 2021-04-15 DIAGNOSIS — D509 Iron deficiency anemia, unspecified: Secondary | ICD-10-CM | POA: Insufficient documentation

## 2021-04-15 DIAGNOSIS — D508 Other iron deficiency anemias: Secondary | ICD-10-CM

## 2021-04-16 ENCOUNTER — Telehealth: Payer: Self-pay | Admitting: *Deleted

## 2021-04-16 NOTE — Telephone Encounter (Signed)
Spoke to pt about iron levels and she can cont. Iron pills then we can check her in 3 months or she can have iron infusion. At  first she said that she will just keep the iron pills but then I asked what dosage and she has been taking 325 mg tablets for years. So based on that she wants IV iron. Theresa Ferguson scheduler called and made appts for her.

## 2021-04-18 ENCOUNTER — Other Ambulatory Visit: Payer: Self-pay | Admitting: Oncology

## 2021-04-22 ENCOUNTER — Inpatient Hospital Stay: Payer: Medicare Other

## 2021-04-22 VITALS — BP 158/69 | HR 71 | Temp 97.5°F | Resp 18

## 2021-04-22 DIAGNOSIS — D508 Other iron deficiency anemias: Secondary | ICD-10-CM

## 2021-04-22 DIAGNOSIS — R5383 Other fatigue: Secondary | ICD-10-CM | POA: Diagnosis not present

## 2021-04-22 DIAGNOSIS — Z170 Estrogen receptor positive status [ER+]: Secondary | ICD-10-CM | POA: Diagnosis not present

## 2021-04-22 DIAGNOSIS — D509 Iron deficiency anemia, unspecified: Secondary | ICD-10-CM | POA: Diagnosis not present

## 2021-04-22 DIAGNOSIS — C50411 Malignant neoplasm of upper-outer quadrant of right female breast: Secondary | ICD-10-CM | POA: Diagnosis not present

## 2021-04-22 MED ORDER — IRON SUCROSE 20 MG/ML IV SOLN
200.0000 mg | Freq: Once | INTRAVENOUS | Status: AC
  Administered 2021-04-22: 200 mg via INTRAVENOUS
  Filled 2021-04-22: qty 10

## 2021-04-22 MED ORDER — SODIUM CHLORIDE 0.9 % IV SOLN
Freq: Once | INTRAVENOUS | Status: AC
  Filled 2021-04-22: qty 250

## 2021-04-22 MED ORDER — SODIUM CHLORIDE 0.9 % IV SOLN: 200.0000 mg | INTRAVENOUS | Status: DC

## 2021-04-22 NOTE — Patient Instructions (Signed)
[facility] [place] [facility]   Discharge Instructions: Thank you for choosing [facility] to provide your oncology and hematology care.  If you have a lab appointment with the Cancer Center, please go directly to the Cancer Center and check in at the registration area.  We strive to give you quality time with your provider. You may need to reschedule your appointment if you arrive late (15 or more minutes).  Arriving late affects you and other patients whose appointments are after yours.  Also, if you miss three or more appointments without notifying the office, you may be dismissed from the clinic at the provider's discretion.      For prescription refill requests, have your pharmacy contact our office and allow 72 hours for refills to be completed.    Today you received the following: Venofer.      BELOW ARE SYMPTOMS THAT SHOULD BE REPORTED IMMEDIATELY: *FEVER GREATER THAN 100.4 F (38 C) OR HIGHER *CHILLS OR SWEATING *NAUSEA AND VOMITING THAT IS NOT CONTROLLED WITH YOUR NAUSEA MEDICATION *UNUSUAL SHORTNESS OF BREATH *UNUSUAL BRUISING OR BLEEDING *URINARY PROBLEMS (pain or burning when urinating, or frequent urination) *BOWEL PROBLEMS (unusual diarrhea, constipation, pain near the anus) TENDERNESS IN MOUTH AND THROAT WITH OR WITHOUT PRESENCE OF ULCERS (sore throat, sores in mouth, or a toothache) UNUSUAL RASH, SWELLING OR PAIN  UNUSUAL VAGINAL DISCHARGE OR ITCHING   Items with * indicate a potential emergency and should be followed up as soon as possible or go to the Emergency Department if any problems should occur.  Should you have questions after your visit or need to cancel or reschedule your appointment, please contact [facility] [place] [facility]  [phone] and follow the prompts.  Office hours are 8:00 a.m. to 4:30 p.m. Monday - Friday. Please note that voicemails left after 4:00 p.m. may not be returned until the following  business day.  We are closed weekends and major holidays. You have access to a nurse at all times for urgent questions. Please call the main number to the clinic [phone] and follow the prompts.  For any non-urgent questions, you may also contact your provider using MyChart. We now offer e-Visits for anyone 18 and older to request care online for non-urgent symptoms. For details visit [email or web address].   Also download the MyChart app! Go to the app store, search "MyChart", open the app, select [facility], and log in with your MyChart username and password.  Due to Covid, a mask is required upon entering the hospital/clinic. If you do not have a mask, one will be given to you upon arrival. For doctor visits, patients may have 1 support person aged 18 or older with them. For treatment visits, patients cannot have anyone with them due to current Covid guidelines and our immunocompromised population.  

## 2021-04-24 ENCOUNTER — Inpatient Hospital Stay: Payer: Medicare Other

## 2021-04-24 ENCOUNTER — Other Ambulatory Visit: Payer: Self-pay

## 2021-04-24 VITALS — BP 174/81 | HR 70 | Temp 96.7°F | Resp 18

## 2021-04-24 DIAGNOSIS — D509 Iron deficiency anemia, unspecified: Secondary | ICD-10-CM | POA: Diagnosis not present

## 2021-04-24 DIAGNOSIS — Z170 Estrogen receptor positive status [ER+]: Secondary | ICD-10-CM | POA: Diagnosis not present

## 2021-04-24 DIAGNOSIS — D508 Other iron deficiency anemias: Secondary | ICD-10-CM

## 2021-04-24 DIAGNOSIS — C50411 Malignant neoplasm of upper-outer quadrant of right female breast: Secondary | ICD-10-CM | POA: Diagnosis not present

## 2021-04-24 DIAGNOSIS — R5383 Other fatigue: Secondary | ICD-10-CM | POA: Diagnosis not present

## 2021-04-24 MED ORDER — SODIUM CHLORIDE 0.9 % IV SOLN: 200.0000 mg | INTRAVENOUS | Status: DC

## 2021-04-24 MED ORDER — SODIUM CHLORIDE 0.9 % IV SOLN
Freq: Once | INTRAVENOUS | Status: AC
  Filled 2021-04-24: qty 250

## 2021-04-24 MED ORDER — IRON SUCROSE 20 MG/ML IV SOLN
200.0000 mg | Freq: Once | INTRAVENOUS | Status: AC
  Administered 2021-04-24: 200 mg via INTRAVENOUS
  Filled 2021-04-24: qty 10

## 2021-04-24 NOTE — Patient Instructions (Signed)

## 2021-04-28 ENCOUNTER — Inpatient Hospital Stay: Payer: Medicare Other

## 2021-04-28 VITALS — BP 156/80 | HR 80 | Temp 96.5°F | Resp 18

## 2021-04-28 DIAGNOSIS — C50411 Malignant neoplasm of upper-outer quadrant of right female breast: Secondary | ICD-10-CM | POA: Diagnosis not present

## 2021-04-28 DIAGNOSIS — D508 Other iron deficiency anemias: Secondary | ICD-10-CM

## 2021-04-28 DIAGNOSIS — Z170 Estrogen receptor positive status [ER+]: Secondary | ICD-10-CM | POA: Diagnosis not present

## 2021-04-28 DIAGNOSIS — R5383 Other fatigue: Secondary | ICD-10-CM | POA: Diagnosis not present

## 2021-04-28 DIAGNOSIS — D509 Iron deficiency anemia, unspecified: Secondary | ICD-10-CM | POA: Diagnosis not present

## 2021-04-28 MED ORDER — SODIUM CHLORIDE 0.9 % IV SOLN: 200.0000 mg | INTRAVENOUS | Status: DC

## 2021-04-28 MED ORDER — SODIUM CHLORIDE 0.9 % IV SOLN
Freq: Once | INTRAVENOUS | Status: AC
  Filled 2021-04-28: qty 250

## 2021-04-28 MED ORDER — IRON SUCROSE 20 MG/ML IV SOLN
200.0000 mg | Freq: Once | INTRAVENOUS | Status: AC
  Administered 2021-04-28: 200 mg via INTRAVENOUS
  Filled 2021-04-28: qty 10

## 2021-04-28 NOTE — Patient Instructions (Signed)

## 2021-04-30 ENCOUNTER — Inpatient Hospital Stay: Payer: Medicare Other

## 2021-04-30 VITALS — BP 183/98 | HR 80 | Temp 96.2°F | Resp 18

## 2021-04-30 DIAGNOSIS — C50411 Malignant neoplasm of upper-outer quadrant of right female breast: Secondary | ICD-10-CM | POA: Diagnosis not present

## 2021-04-30 DIAGNOSIS — R5383 Other fatigue: Secondary | ICD-10-CM | POA: Diagnosis not present

## 2021-04-30 DIAGNOSIS — D508 Other iron deficiency anemias: Secondary | ICD-10-CM

## 2021-04-30 DIAGNOSIS — D509 Iron deficiency anemia, unspecified: Secondary | ICD-10-CM | POA: Diagnosis not present

## 2021-04-30 DIAGNOSIS — Z170 Estrogen receptor positive status [ER+]: Secondary | ICD-10-CM | POA: Diagnosis not present

## 2021-04-30 MED ORDER — IRON SUCROSE 20 MG/ML IV SOLN
200.0000 mg | Freq: Once | INTRAVENOUS | Status: AC
  Administered 2021-04-30: 200 mg via INTRAVENOUS
  Filled 2021-04-30: qty 10

## 2021-04-30 MED ORDER — SODIUM CHLORIDE 0.9 % IV SOLN
Freq: Once | INTRAVENOUS | Status: AC
  Filled 2021-04-30: qty 250

## 2021-04-30 MED ORDER — SODIUM CHLORIDE 0.9 % IV SOLN: 200.0000 mg | INTRAVENOUS | Status: DC

## 2021-04-30 NOTE — Patient Instructions (Signed)
[facility] [place] [facility]  Discharge Instructions: Thank you for choosing [facility] to provide your oncology and hematology care.  If you have a lab appointment with the Cancer Center, please go directly to the Cancer Center and check in at the registration area.  Wear comfortable clothing and clothing appropriate for easy access to any Portacath or PICC line.   We strive to give you quality time with your provider. You may need to reschedule your appointment if you arrive late (15 or more minutes).  Arriving late affects you and other patients whose appointments are after yours.  Also, if you miss three or more appointments without notifying the office, you may be dismissed from the clinic at the provider's discretion.      For prescription refill requests, have your pharmacy contact our office and allow 72 hours for refills to be completed.    Today you received the following chemotherapy and/or immunotherapy agents VENOFER      To help prevent nausea and vomiting after your treatment, we encourage you to take your nausea medication as directed.  BELOW ARE SYMPTOMS THAT SHOULD BE REPORTED IMMEDIATELY: *FEVER GREATER THAN 100.4 F (38 C) OR HIGHER *CHILLS OR SWEATING *NAUSEA AND VOMITING THAT IS NOT CONTROLLED WITH YOUR NAUSEA MEDICATION *UNUSUAL SHORTNESS OF BREATH *UNUSUAL BRUISING OR BLEEDING *URINARY PROBLEMS (pain or burning when urinating, or frequent urination) *BOWEL PROBLEMS (unusual diarrhea, constipation, pain near the anus) TENDERNESS IN MOUTH AND THROAT WITH OR WITHOUT PRESENCE OF ULCERS (sore throat, sores in mouth, or a toothache) UNUSUAL RASH, SWELLING OR PAIN  UNUSUAL VAGINAL DISCHARGE OR ITCHING   Items with * indicate a potential emergency and should be followed up as soon as possible or go to the Emergency Department if any problems should occur.  Please show the CHEMOTHERAPY ALERT CARD or IMMUNOTHERAPY ALERT CARD at check-in to  the Emergency Department and triage nurse.  Should you have questions after your visit or need to cancel or reschedule your appointment, please contact [facility] [place] [facility]  [phone] and follow the prompts.  Office hours are 8:00 a.m. to 4:30 p.m. Monday - Friday. Please note that voicemails left after 4:00 p.m. may not be returned until the following business day.  We are closed weekends and major holidays. You have access to a nurse at all times for urgent questions. Please call the main number to the clinic [phone] and follow the prompts.  For any non-urgent questions, you may also contact your provider using MyChart. We now offer e-Visits for anyone 18 and older to request care online for non-urgent symptoms. For details visit [email or web address].   Also download the MyChart app! Go to the app store, search "MyChart", open the app, select [facility], and log in with your MyChart username and password.  Due to Covid, a mask is required upon entering the hospital/clinic. If you do not have a mask, one will be given to you upon arrival. For doctor visits, patients may have 1 support person aged 18 or older with them. For treatment visits, patients cannot have anyone with them due to current Covid guidelines and our immunocompromised population.   Iron Sucrose Injection What is this medication? IRON SUCROSE (EYE ern SOO krose) treats low levels of iron (iron deficiency anemia) in people with kidney disease. Iron is a mineral that plays an important role in making red blood cells, which carry oxygen from your lungs to the rest of your body. This medicine may   be used for other purposes; ask your health care provider or pharmacist if you have questions. COMMON BRAND NAME(S): Venofer What should I tell my care team before I take this medication? They need to know if you have any of these conditions: Anemia not caused by low iron levels Heart disease High levels  of iron in the blood Kidney disease Liver disease An unusual or allergic reaction to iron, other medications, foods, dyes, or preservatives Pregnant or trying to get pregnant Breast-feeding How should I use this medication? This medication is for infusion into a vein. It is given in a hospital or clinic setting. Talk to your care team about the use of this medication in children. While this medication may be prescribed for children as young as 2 years for selected conditions, precautions do apply. Overdosage: If you think you have taken too much of this medicine contact a poison control center or emergency room at once. NOTE: This medicine is only for you. Do not share this medicine with others. What if I miss a dose? It is important not to miss your dose. Call your care team if you are unable to keep an appointment. What may interact with this medication? Do not take this medication with any of the following: Deferoxamine Dimercaprol Other iron products This medication may also interact with the following: Chloramphenicol Deferasirox This list may not describe all possible interactions. Give your health care provider a list of all the medicines, herbs, non-prescription drugs, or dietary supplements you use. Also tell them if you smoke, drink alcohol, or use illegal drugs. Some items may interact with your medicine. What should I watch for while using this medication? Visit your care team regularly. Tell your care team if your symptoms do not start to get better or if they get worse. You may need blood work done while you are taking this medication. You may need to follow a special diet. Talk to your care team. Foods that contain iron include: whole grains/cereals, dried fruits, beans, or peas, leafy green vegetables, and organ meats (liver, kidney). What side effects may I notice from receiving this medication? Side effects that you should report to your care team as soon as  possible: Allergic reactions-skin rash, itching, hives, swelling of the face, lips, tongue, or throat Low blood pressure-dizziness, feeling faint or lightheaded, blurry vision Shortness of breath Side effects that usually do not require medical attention (report to your care team if they continue or are bothersome): Flushing Headache Joint pain Muscle pain Nausea Pain, redness, or irritation at injection site This list may not describe all possible side effects. Call your doctor for medical advice about side effects. You may report side effects to FDA at 1-800-FDA-1088. Where should I keep my medication? This medication is given in a hospital or clinic and will not be stored at home. NOTE: This sheet is a summary. It may not cover all possible information. If you have questions about this medicine, talk to your doctor, pharmacist, or health care provider.  2022 Elsevier/Gold Standard (2020-11-12 12:52:06)  

## 2021-05-02 ENCOUNTER — Inpatient Hospital Stay: Payer: Medicare Other | Attending: Oncology

## 2021-05-02 VITALS — BP 194/94 | HR 84 | Temp 96.0°F | Resp 18

## 2021-05-02 DIAGNOSIS — D509 Iron deficiency anemia, unspecified: Secondary | ICD-10-CM | POA: Insufficient documentation

## 2021-05-02 DIAGNOSIS — D508 Other iron deficiency anemias: Secondary | ICD-10-CM

## 2021-05-02 MED ORDER — SODIUM CHLORIDE 0.9 % IV SOLN
Freq: Once | INTRAVENOUS | Status: AC
  Filled 2021-05-02: qty 250

## 2021-05-02 MED ORDER — SODIUM CHLORIDE 0.9 % IV SOLN: 200.0000 mg | INTRAVENOUS | Status: DC

## 2021-05-02 MED ORDER — IRON SUCROSE 20 MG/ML IV SOLN
200.0000 mg | Freq: Once | INTRAVENOUS | Status: AC
  Administered 2021-05-02: 200 mg via INTRAVENOUS
  Filled 2021-05-02: qty 10

## 2021-05-02 NOTE — Patient Instructions (Signed)
[facility] [place] [facility]  Discharge Instructions: Thank you for choosing [facility] to provide your oncology and hematology care.  If you have a lab appointment with the Cancer Center, please go directly to the Cancer Center and check in at the registration area.  Wear comfortable clothing and clothing appropriate for easy access to any Portacath or PICC line.   We strive to give you quality time with your provider. You may need to reschedule your appointment if you arrive late (15 or more minutes).  Arriving late affects you and other patients whose appointments are after yours.  Also, if you miss three or more appointments without notifying the office, you may be dismissed from the clinic at the provider's discretion.      For prescription refill requests, have your pharmacy contact our office and allow 72 hours for refills to be completed.    Today you received the following chemotherapy and/or immunotherapy agents VENOFER      To help prevent nausea and vomiting after your treatment, we encourage you to take your nausea medication as directed.  BELOW ARE SYMPTOMS THAT SHOULD BE REPORTED IMMEDIATELY: *FEVER GREATER THAN 100.4 F (38 C) OR HIGHER *CHILLS OR SWEATING *NAUSEA AND VOMITING THAT IS NOT CONTROLLED WITH YOUR NAUSEA MEDICATION *UNUSUAL SHORTNESS OF BREATH *UNUSUAL BRUISING OR BLEEDING *URINARY PROBLEMS (pain or burning when urinating, or frequent urination) *BOWEL PROBLEMS (unusual diarrhea, constipation, pain near the anus) TENDERNESS IN MOUTH AND THROAT WITH OR WITHOUT PRESENCE OF ULCERS (sore throat, sores in mouth, or a toothache) UNUSUAL RASH, SWELLING OR PAIN  UNUSUAL VAGINAL DISCHARGE OR ITCHING   Items with * indicate a potential emergency and should be followed up as soon as possible or go to the Emergency Department if any problems should occur.  Please show the CHEMOTHERAPY ALERT CARD or IMMUNOTHERAPY ALERT CARD at check-in to  the Emergency Department and triage nurse.  Should you have questions after your visit or need to cancel or reschedule your appointment, please contact [facility] [facility]  [phone] and follow the prompts.  Office hours are 8:00 a.m. to 4:30 p.m. Monday - Friday. Please note that voicemails left after 4:00 p.m. may not be returned until the following business day.  We are closed weekends and major holidays. You have access to a nurse at all times for urgent questions. Please call the main number to the clinic [phone] and follow the prompts.  For any non-urgent questions, you may also contact your provider using MyChart. We now offer e-Visits for anyone 18 and older to request care online for non-urgent symptoms. For details visit [email or web address].   Also download the MyChart app! Go to the app store, search "MyChart", open the app, select [facility], and log in with your MyChart username and password.  Due to Covid, a mask is required upon entering the hospital/clinic. If you do not have a mask, one will be given to you upon arrival. For doctor visits, patients may have 1 support person aged 18 or older with them. For treatment visits, patients cannot have anyone with them due to current Covid guidelines and our immunocompromised population.   Iron Sucrose Injection What is this medication? IRON SUCROSE (EYE ern SOO krose) treats low levels of iron (iron deficiency anemia) in people with kidney disease. Iron is a mineral that plays an important role in making red blood cells, which carry oxygen from your lungs to the rest of your body. This medicine may   be used for other purposes; ask your health care provider or pharmacist if you have questions. COMMON BRAND NAME(S): Venofer What should I tell my care team before I take this medication? They need to know if you have any of these conditions: Anemia not caused by low iron levels Heart disease High levels  of iron in the blood Kidney disease Liver disease An unusual or allergic reaction to iron, other medications, foods, dyes, or preservatives Pregnant or trying to get pregnant Breast-feeding How should I use this medication? This medication is for infusion into a vein. It is given in a hospital or clinic setting. Talk to your care team about the use of this medication in children. While this medication may be prescribed for children as young as 2 years for selected conditions, precautions do apply. Overdosage: If you think you have taken too much of this medicine contact a poison control center or emergency room at once. NOTE: This medicine is only for you. Do not share this medicine with others. What if I miss a dose? It is important not to miss your dose. Call your care team if you are unable to keep an appointment. What may interact with this medication? Do not take this medication with any of the following: Deferoxamine Dimercaprol Other iron products This medication may also interact with the following: Chloramphenicol Deferasirox This list may not describe all possible interactions. Give your health care provider a list of all the medicines, herbs, non-prescription drugs, or dietary supplements you use. Also tell them if you smoke, drink alcohol, or use illegal drugs. Some items may interact with your medicine. What should I watch for while using this medication? Visit your care team regularly. Tell your care team if your symptoms do not start to get better or if they get worse. You may need blood work done while you are taking this medication. You may need to follow a special diet. Talk to your care team. Foods that contain iron include: whole grains/cereals, dried fruits, beans, or peas, leafy green vegetables, and organ meats (liver, kidney). What side effects may I notice from receiving this medication? Side effects that you should report to your care team as soon as  possible: Allergic reactions-skin rash, itching, hives, swelling of the face, lips, tongue, or throat Low blood pressure-dizziness, feeling faint or lightheaded, blurry vision Shortness of breath Side effects that usually do not require medical attention (report to your care team if they continue or are bothersome): Flushing Headache Joint pain Muscle pain Nausea Pain, redness, or irritation at injection site This list may not describe all possible side effects. Call your doctor for medical advice about side effects. You may report side effects to FDA at 1-800-FDA-1088. Where should I keep my medication? This medication is given in a hospital or clinic and will not be stored at home. NOTE: This sheet is a summary. It may not cover all possible information. If you have questions about this medicine, talk to your doctor, pharmacist, or health care provider.  2022 Elsevier/Gold Standard (2020-11-12 12:52:06)  

## 2021-05-23 ENCOUNTER — Other Ambulatory Visit: Payer: Self-pay | Admitting: Oncology

## 2021-07-08 ENCOUNTER — Other Ambulatory Visit: Payer: Self-pay | Admitting: Otolaryngology

## 2021-07-08 ENCOUNTER — Other Ambulatory Visit (HOSPITAL_COMMUNITY): Payer: Self-pay | Admitting: Otolaryngology

## 2021-07-08 DIAGNOSIS — J301 Allergic rhinitis due to pollen: Secondary | ICD-10-CM | POA: Diagnosis not present

## 2021-07-08 DIAGNOSIS — K118 Other diseases of salivary glands: Secondary | ICD-10-CM

## 2021-07-08 DIAGNOSIS — D37030 Neoplasm of uncertain behavior of the parotid salivary glands: Secondary | ICD-10-CM | POA: Diagnosis not present

## 2021-07-15 ENCOUNTER — Other Ambulatory Visit: Payer: Self-pay

## 2021-07-15 ENCOUNTER — Other Ambulatory Visit: Payer: Self-pay | Admitting: *Deleted

## 2021-07-15 ENCOUNTER — Inpatient Hospital Stay: Payer: Medicare Other | Attending: Oncology

## 2021-07-15 DIAGNOSIS — D508 Other iron deficiency anemias: Secondary | ICD-10-CM

## 2021-07-15 DIAGNOSIS — C50411 Malignant neoplasm of upper-outer quadrant of right female breast: Secondary | ICD-10-CM

## 2021-07-15 DIAGNOSIS — C50811 Malignant neoplasm of overlapping sites of right female breast: Secondary | ICD-10-CM | POA: Insufficient documentation

## 2021-07-15 DIAGNOSIS — Z170 Estrogen receptor positive status [ER+]: Secondary | ICD-10-CM

## 2021-07-15 DIAGNOSIS — Z79811 Long term (current) use of aromatase inhibitors: Secondary | ICD-10-CM | POA: Diagnosis not present

## 2021-07-15 DIAGNOSIS — D509 Iron deficiency anemia, unspecified: Secondary | ICD-10-CM | POA: Insufficient documentation

## 2021-07-15 LAB — IRON AND TIBC
Iron: 82 ug/dL (ref 28–170)
Saturation Ratios: 28 % (ref 10.4–31.8)
TIBC: 294 ug/dL (ref 250–450)
UIBC: 212 ug/dL

## 2021-07-15 LAB — CBC
HCT: 31.1 % — ABNORMAL LOW (ref 36.0–46.0)
Hemoglobin: 10.6 g/dL — ABNORMAL LOW (ref 12.0–15.0)
MCH: 33.1 pg (ref 26.0–34.0)
MCHC: 34.1 g/dL (ref 30.0–36.0)
MCV: 97.2 fL (ref 80.0–100.0)
Platelets: 135 10*3/uL — ABNORMAL LOW (ref 150–400)
RBC: 3.2 MIL/uL — ABNORMAL LOW (ref 3.87–5.11)
RDW: 12 % (ref 11.5–15.5)
WBC: 3.5 10*3/uL — ABNORMAL LOW (ref 4.0–10.5)
nRBC: 0 % (ref 0.0–0.2)

## 2021-07-15 LAB — FERRITIN: Ferritin: 161 ng/mL (ref 11–307)

## 2021-07-15 NOTE — Progress Notes (Signed)
Survivorship Care Plan visit completed.  Treatment summary reviewed and given to patient.  ASCO answers booklet reviewed and given to patient.  CARE program and Cancer Transitions discussed with patient along with other resources cancer center offers to patients and caregivers.  Patient verbalized understanding.    

## 2021-07-22 ENCOUNTER — Ambulatory Visit
Admission: RE | Admit: 2021-07-22 | Discharge: 2021-07-22 | Disposition: A | Discharge status: Home / Self Care | Payer: Medicare Other | Source: Ambulatory Visit | Attending: Otolaryngology | Admitting: Otolaryngology

## 2021-07-22 ENCOUNTER — Other Ambulatory Visit: Payer: Self-pay

## 2021-07-22 DIAGNOSIS — R221 Localized swelling, mass and lump, neck: Secondary | ICD-10-CM | POA: Diagnosis not present

## 2021-07-22 DIAGNOSIS — K118 Other diseases of salivary glands: Secondary | ICD-10-CM | POA: Diagnosis not present

## 2021-07-22 DIAGNOSIS — M47812 Spondylosis without myelopathy or radiculopathy, cervical region: Secondary | ICD-10-CM | POA: Diagnosis not present

## 2021-07-22 DIAGNOSIS — D37030 Neoplasm of uncertain behavior of the parotid salivary glands: Secondary | ICD-10-CM | POA: Insufficient documentation

## 2021-07-22 LAB — POCT I-STAT CREATININE: Creatinine, Ser: 1.2 mg/dL — ABNORMAL HIGH (ref 0.44–1.00)

## 2021-07-22 MED ORDER — IOHEXOL 300 MG/ML  SOLN
75.0000 mL | Freq: Once | INTRAMUSCULAR | Status: AC | PRN
  Administered 2021-07-22: 75 mL via INTRAVENOUS

## 2021-08-18 DIAGNOSIS — S300XXA Contusion of lower back and pelvis, initial encounter: Secondary | ICD-10-CM | POA: Diagnosis not present

## 2021-08-26 ENCOUNTER — Other Ambulatory Visit: Payer: Self-pay | Admitting: General Surgery

## 2021-08-26 DIAGNOSIS — Z9889 Other specified postprocedural states: Secondary | ICD-10-CM

## 2021-08-27 ENCOUNTER — Ambulatory Visit
Admission: RE | Admit: 2021-08-27 | Discharge: 2021-08-27 | Disposition: A | Discharge status: Home / Self Care | Payer: Medicare Other | Source: Ambulatory Visit | Attending: General Surgery | Admitting: General Surgery

## 2021-08-27 ENCOUNTER — Other Ambulatory Visit: Payer: Self-pay

## 2021-08-27 DIAGNOSIS — Z9889 Other specified postprocedural states: Secondary | ICD-10-CM

## 2021-08-27 DIAGNOSIS — Z853 Personal history of malignant neoplasm of breast: Secondary | ICD-10-CM | POA: Diagnosis not present

## 2021-08-27 DIAGNOSIS — R922 Inconclusive mammogram: Secondary | ICD-10-CM | POA: Diagnosis not present

## 2021-08-27 HISTORY — DX: Personal history of irradiation: Z92.3

## 2021-08-28 DIAGNOSIS — Z23 Encounter for immunization: Secondary | ICD-10-CM | POA: Diagnosis not present

## 2021-09-10 DIAGNOSIS — E119 Type 2 diabetes mellitus without complications: Secondary | ICD-10-CM | POA: Diagnosis not present

## 2021-09-15 DIAGNOSIS — Z23 Encounter for immunization: Secondary | ICD-10-CM | POA: Diagnosis not present

## 2021-09-30 DIAGNOSIS — C50919 Malignant neoplasm of unspecified site of unspecified female breast: Secondary | ICD-10-CM | POA: Diagnosis not present

## 2021-09-30 DIAGNOSIS — E1121 Type 2 diabetes mellitus with diabetic nephropathy: Secondary | ICD-10-CM | POA: Diagnosis not present

## 2021-09-30 DIAGNOSIS — E1149 Type 2 diabetes mellitus with other diabetic neurological complication: Secondary | ICD-10-CM | POA: Diagnosis not present

## 2021-09-30 DIAGNOSIS — E782 Mixed hyperlipidemia: Secondary | ICD-10-CM | POA: Diagnosis not present

## 2021-09-30 DIAGNOSIS — I129 Hypertensive chronic kidney disease with stage 1 through stage 4 chronic kidney disease, or unspecified chronic kidney disease: Secondary | ICD-10-CM | POA: Diagnosis not present

## 2021-09-30 DIAGNOSIS — N182 Chronic kidney disease, stage 2 (mild): Secondary | ICD-10-CM | POA: Diagnosis not present

## 2021-09-30 DIAGNOSIS — F39 Unspecified mood [affective] disorder: Secondary | ICD-10-CM | POA: Diagnosis not present

## 2021-10-02 DIAGNOSIS — C50411 Malignant neoplasm of upper-outer quadrant of right female breast: Secondary | ICD-10-CM | POA: Diagnosis not present

## 2021-10-02 DIAGNOSIS — Z170 Estrogen receptor positive status [ER+]: Secondary | ICD-10-CM | POA: Diagnosis not present

## 2021-10-08 DIAGNOSIS — D37030 Neoplasm of uncertain behavior of the parotid salivary glands: Secondary | ICD-10-CM | POA: Diagnosis not present

## 2021-10-08 DIAGNOSIS — J301 Allergic rhinitis due to pollen: Secondary | ICD-10-CM | POA: Diagnosis not present

## 2021-10-09 ENCOUNTER — Other Ambulatory Visit: Payer: Self-pay | Admitting: Otolaryngology

## 2021-10-09 DIAGNOSIS — K118 Other diseases of salivary glands: Secondary | ICD-10-CM

## 2021-10-13 ENCOUNTER — Telehealth: Payer: Medicare Other | Admitting: Oncology

## 2021-10-14 ENCOUNTER — Encounter (HOSPITAL_COMMUNITY): Payer: Self-pay

## 2021-10-24 IMAGING — MG MM PLC BREAST LOC DEV 1ST LESION INC*R*
8 series · 8 of 8 positions shown · non-contrast
Comparison: Previous exam(s).

CLINICAL DATA: Biopsy proven invasive mammary carcinoma in the
right breast.

EXAM:
MAMMOGRAPHIC GUIDED RADIOACTIVE SEED LOCALIZATION OF THE RIGHT
BREAST

[R CC (1 of 5)]
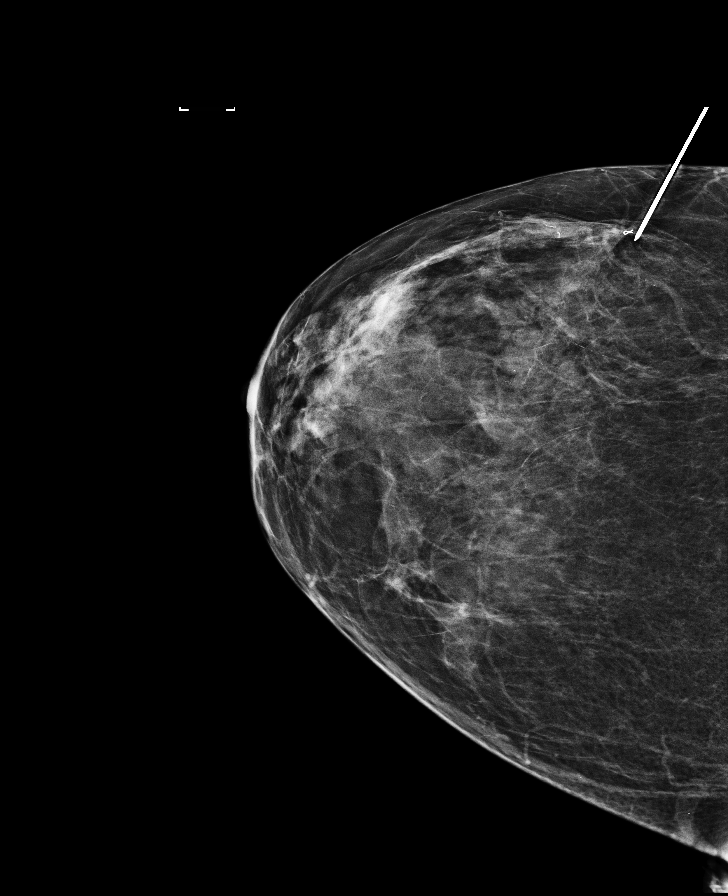

[R CC (2 of 5)]
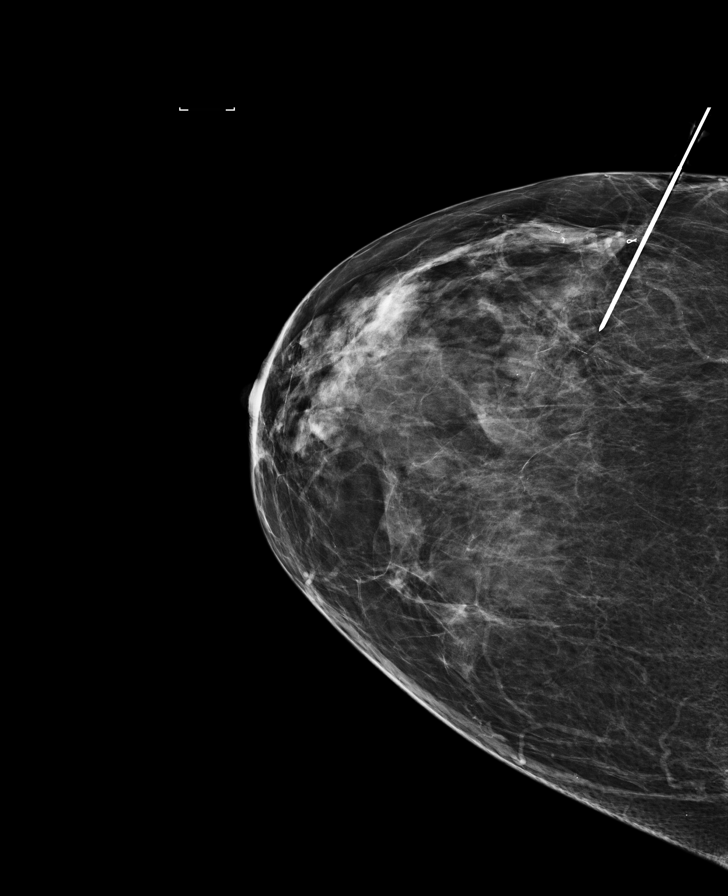

[R LM (1 of 3)]
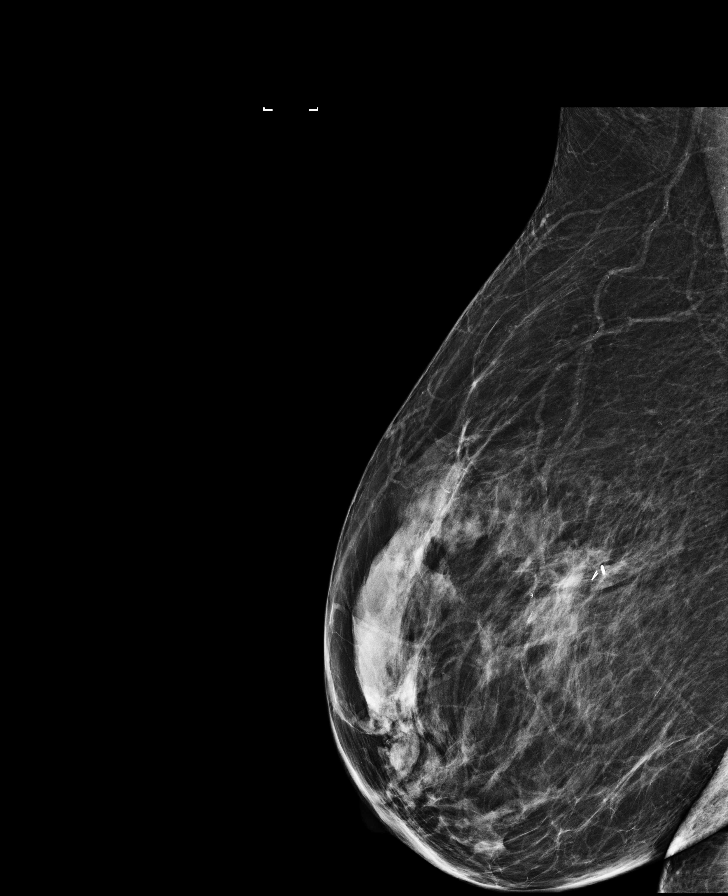

[R CC (3 of 5)]
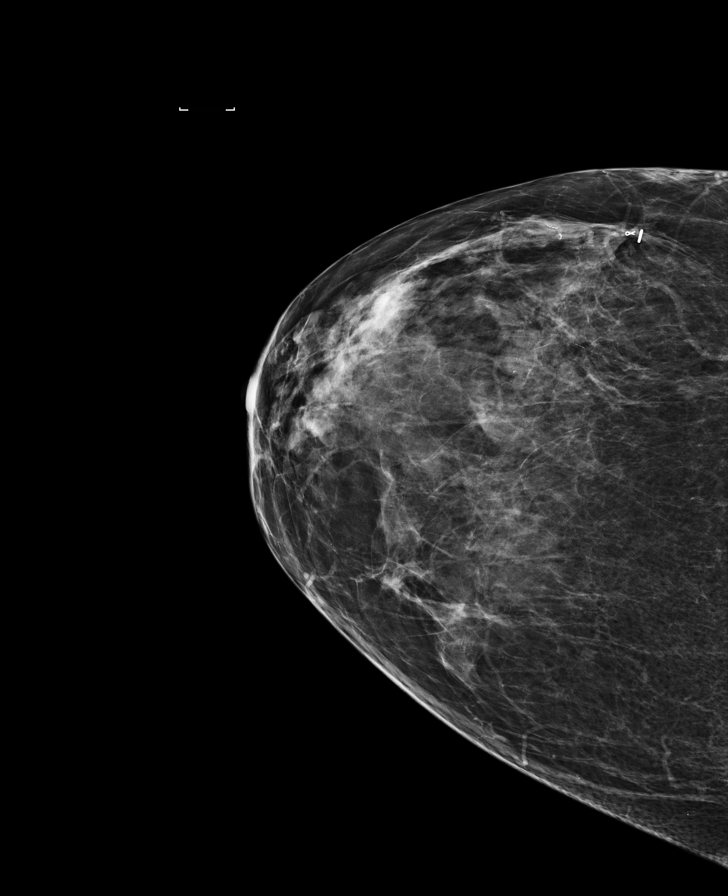

[R CC (4 of 5)]
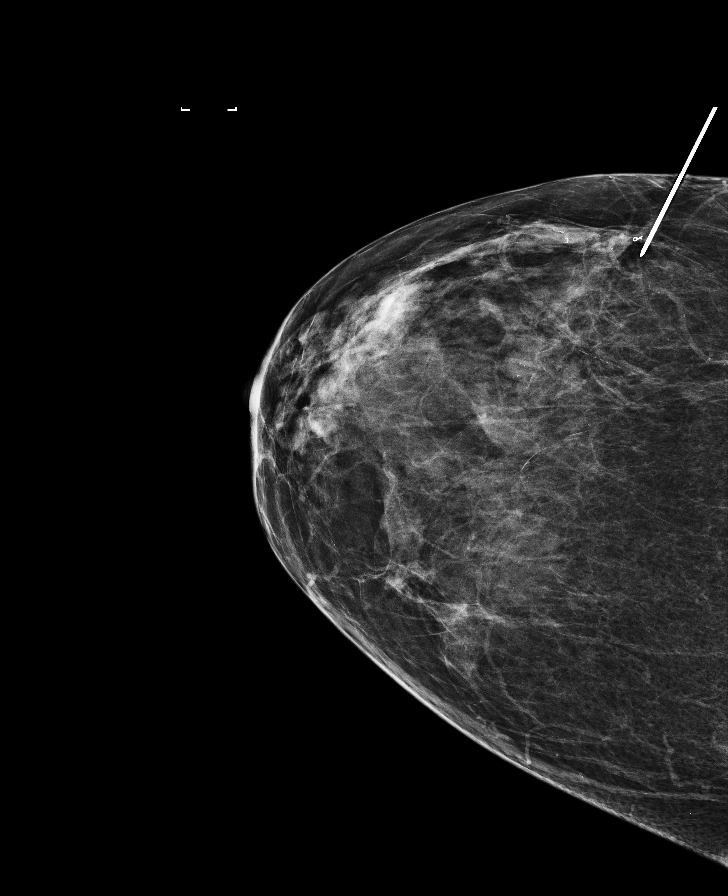

[R LM (2 of 3)]
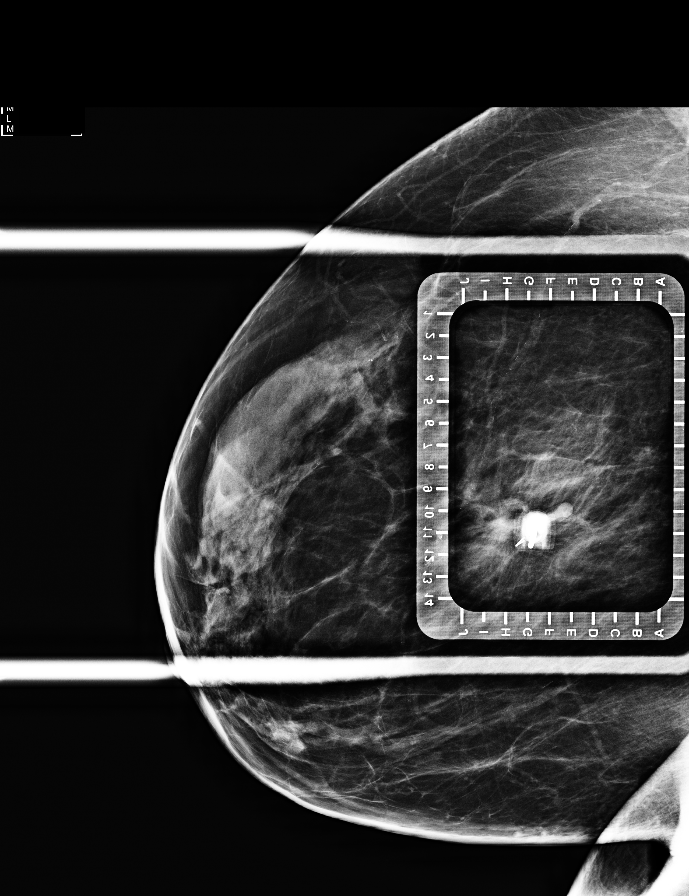

[R CC (5 of 5)]
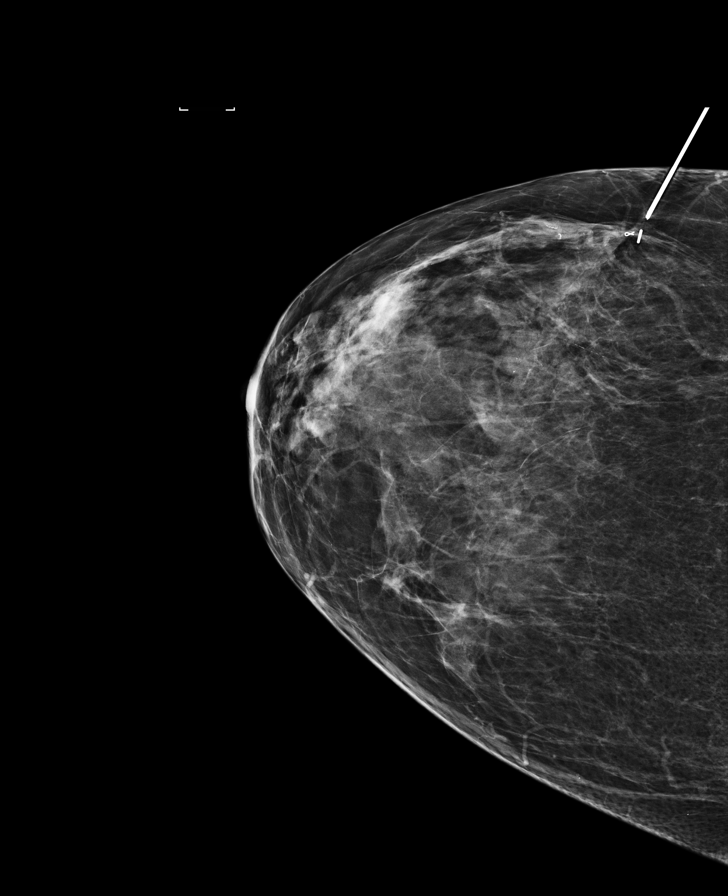

[R LM (3 of 3)]
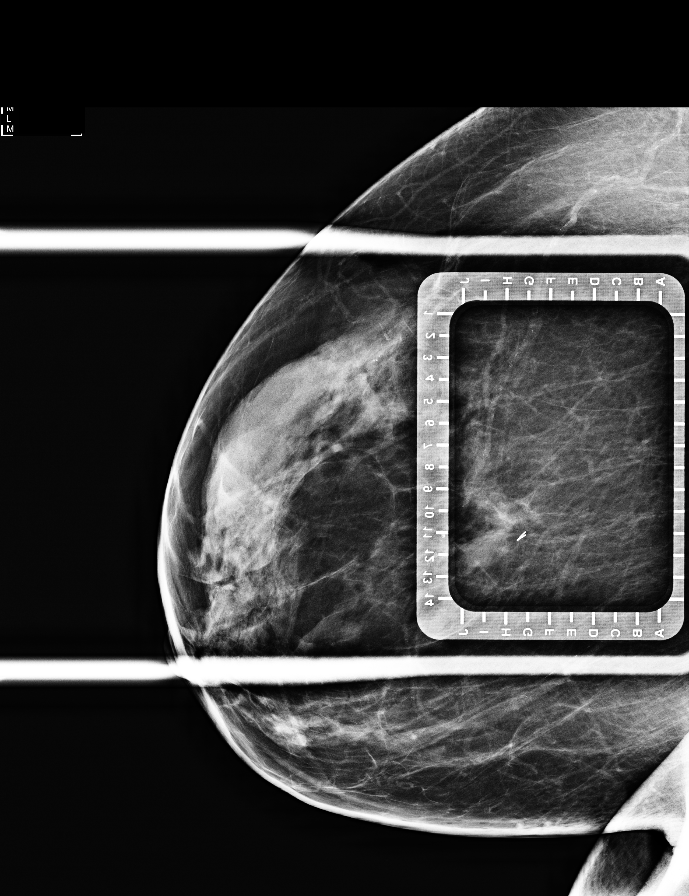

[8 of 8 positions shown; findings below may reference images not displayed]



The usual time-out protocol was performed immediately prior to the
procedure.

Using mammographic guidance, sterile technique, 1% lidocaine and an
U-IOZ radioactive seed, the ribbon shaped was localized using a
lateral to medial approach. The follow-up mammogram images confirm
the seed in the expected location and were marked for Dr. Zeinab.

Follow-up survey of the patient confirms presence of the radioactive
seed.

Order number of U-IOZ seed:  [id number].

Total activity:  0.242 millicurie reference Date: 07/30/2020

The patient tolerated the procedure well and was released from the
[REDACTED]. She was given instructions regarding seed removal.
IMPRESSION: Radioactive seed localization right breast. No apparent
complications.

## 2021-12-08 DIAGNOSIS — M7731 Calcaneal spur, right foot: Secondary | ICD-10-CM | POA: Diagnosis not present

## 2021-12-08 DIAGNOSIS — E0842 Diabetes mellitus due to underlying condition with diabetic polyneuropathy: Secondary | ICD-10-CM | POA: Diagnosis not present

## 2021-12-08 DIAGNOSIS — M7661 Achilles tendinitis, right leg: Secondary | ICD-10-CM | POA: Diagnosis not present

## 2021-12-08 DIAGNOSIS — K219 Gastro-esophageal reflux disease without esophagitis: Secondary | ICD-10-CM | POA: Insufficient documentation

## 2021-12-08 DIAGNOSIS — F39 Unspecified mood [affective] disorder: Secondary | ICD-10-CM | POA: Insufficient documentation

## 2021-12-08 DIAGNOSIS — I129 Hypertensive chronic kidney disease with stage 1 through stage 4 chronic kidney disease, or unspecified chronic kidney disease: Secondary | ICD-10-CM | POA: Insufficient documentation

## 2021-12-29 DIAGNOSIS — M7661 Achilles tendinitis, right leg: Secondary | ICD-10-CM | POA: Diagnosis not present

## 2021-12-29 DIAGNOSIS — M7731 Calcaneal spur, right foot: Secondary | ICD-10-CM | POA: Diagnosis not present

## 2022-02-25 ENCOUNTER — Ambulatory Visit (INDEPENDENT_AMBULATORY_CARE_PROVIDER_SITE_OTHER): Payer: Medicare Other | Admitting: Family Medicine

## 2022-02-25 ENCOUNTER — Encounter: Payer: Self-pay | Admitting: Family Medicine

## 2022-02-25 VITALS — BP 146/79 | HR 78 | Ht 65.0 in | Wt 153.0 lb

## 2022-02-25 DIAGNOSIS — G629 Polyneuropathy, unspecified: Secondary | ICD-10-CM | POA: Diagnosis not present

## 2022-02-25 DIAGNOSIS — G2581 Restless legs syndrome: Secondary | ICD-10-CM | POA: Diagnosis not present

## 2022-02-25 MED ORDER — GABAPENTIN 800 MG PO TABS: ORAL_TABLET | ORAL | 3 refills | Status: DC

## 2022-02-25 NOTE — Patient Instructions (Signed)
Below is our plan:  We will continue gabapentin '800mg'$  in am and at lunch and '1600mg'$  at dinner. Continue to keep close eye on BP. May consider lower dose if needed. Continue neuropathy cream as needed. Consider adding alpha lipoic acid back if not already taking.   Please make sure you are staying well hydrated. I recommend 50-60 ounces daily. Well balanced diet and regular exercise encouraged. Consistent sleep schedule with 6-8 hours recommended.   Please continue follow up with care team as directed.   Follow up with me in 1 year   You may receive a survey regarding today's visit. I encourage you to leave honest feed back as I do use this information to improve patient care. Thank you for seeing me today!   Alpha-lipoic acid Alpha-lipoic acid (ALA) is a naturally occurring, dietary supplement with antioxidant properties and it is an essential cofactor for mitochondrial bioenergetic enzymes. It has been studied in clinical trials of DPN because oxidative stress is thought to be a major contributor to the pathogenesis of DPN. Several meta analyses suggest that ALA is an effective and safe drug for the treatment of symptomatic DPN [id number] . The SYDNEY 2 trial was a randomized, double-blind, placebo-controlled trial of different doses of once daily, oral doses of either 600, 1200, or 1800 mg ALA in patients with existing DPN [75] . A statistically significant reduction in the Total Symptom Score (TSS) compared to placebo was seen for each dose of ALA after 5 weeks. The TSS decreased by 51% in the 600 mg group, 48% in the 1200 mg group, and 52% in the 1800 mg group compared to 32% in the placebo group ( P <.05 vs placebo). Significant improvements were also found in the Neuropathy Symptoms and Change score and a lower score was found for the Neuropathy Impairment Score (NIS). All three ALA groups had improvement in neuropathic symptoms and deficits; however, side-effects were greater with higher ALA doses,  and thus the recommended dose of ALA is 600 mg once daily. 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 This multicentered, randomized, double-blind, parallel-group trial suggested that ALA may improve neuropathic deficits in the long term with good tolerability. The primary endpoint was a composite score of the NIS-lower limb (NIS-LL) and seven neurophysiological tests and a clinical responder was defined as someone with an improvement of at least 2 points on the NIS and NIS-LL. After 4 years of treatment, the rates of clinical responders were higher and the rates of progressors were lower with the ALA versus placebo ( P =.013 and P =.025 for the NIS and NIS-LL, respectively) [76] . However, there was no significant difference in the primary endpoint and for this reason the study drug was not approved by the [place] FDA. Post hoc analysis of the NATHAN 1 trial found that improvement and prevention of progression of the NIS-LL with ALA versus placebo was predicted by higher age, lower BMI, female sex, normal blood pressure, history of cardiovascular disease, insulin treatment, longer duration of diabetes and neuropathy, and higher neuropathy stage [77] . The authors concluded that better outcomes in neuropathic impairments with treatment of mild to moderate DPN with ALA 600 mg daily was predicted by optimal control of cardiovascular risk factors in patients with more severe neuropathy. The effects of ALA on CAN in patients with T2DM were examined by the [facility]) Study [78] .  This randomized, double-blind, placebo-controlled multicenter trial assigned patients with T2DM and a reduced heart rate variability at baseline to treatment with 800 mg ALA orally or placebo. After 4 months of treatment, the ALA was well tolerated and there were small, but not statistically significant improvements in  components of the cardiac autonomic spectral analysis in the ALA-treated patients compared to placebo.

## 2022-02-25 NOTE — Progress Notes (Signed)
PATIENT: Theresa Ferguson DOB: Sep 28, 1945  REASON FOR VISIT: follow up HISTORY FROM: patient  Chief Complaint  Patient presents with   Follow-up    Rm 1 alone here for yearly f/u on RLS- she reports she has been doing fairly well. Intermittently she has had numbness that starts at the bottom of here feet and will work it's way up.     HISTORY OF PRESENT ILLNESS:  02/25/2022 ALL: Theresa Ferguson returns for follow up for RLS and neuropathy. She continues '800mg'$  in am, '800mg'$  at lunch and '1600mg'$  in evening. She feels gabapentin really helps with both restless leg sensation and burning stinging pain. She does have intermittent numbness. She does not think she continued alpha lipoic acid but has continued neuropathy cream as needed that helps. She is going well. She has started using a cane for stability. She is followed closely by her care team. She discontinued Arimidex. She could not tolerate side effects. She has routine mammograms and blood work. She continues to have labile BP. She is working closely with PCP. She continues lisinopril-HCTZ. She is staying hydrated.   02/25/2021 ALL: Theresa Ferguson returns for RLS follow up. She continues gabapentin '800mg'$  in am, '800mg'$  early afternoon and '1600mg'$  at bedtime. She added alpha lipoic acid supplements last year that have helped. She also uses neuropathy cream as needed. She reports that symptoms are stable. She did have a period of worsening about 3 months ago but feels that pain and discomfort has improved back to baseline. She sleeps well.   She had an abnormal mammogram in 05/2020. 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 No adjuvant chemo, she was started on Arimidex. She is tolerating fairly well but has noticed some new symptoms. She started having hot flashes within a week of starting the medication. Her husband has told her that she is a little more moody than normal. She describes  an electrical feeling that shoots through her body from time to time. This sensation is usually associated with feeling a little lightheaded and like she has tunnel vision. Symptoms usually occur when standing, sometimes after sitting but not always. Symptoms last about 30 seconds to a minute and then resolve. No syncopal events. No falls. She had last event yesterday after being outside working in her yard. She was sweating and clothes were soaked. When she went inside to take a break she felt a "shock go through" her body and felt a little lightheaded. After getting inside and resting, symptoms resolved. BP was 106/60. Usually 120's-140/70's. She admits that she does not drink very much water. She denies chest pain or shob. No palpitations. She has follow up with PCP next month and oncology in August.   02/26/2020 ALL: Theresa Ferguson is a 76 y.o. female here today for follow up for RLS and neuropathy. She is doing ok. She continues to have numbness and tingling of bilateral feet. Symptoms wax and wane. She does still have restless leg symptoms as well. Some days are better than others. Neuropathy cream does seem to help. She has noticed tingling of both hands recently. Symptoms seem to have come about over the past 2-3 months. She is s/p internal fixation with titanium rod placement of right wrist in 10/2018 for fractured wrist. She has also had hand surgery for removal of nodules in the past. She has neck "tenderness" but no significant pain. She is followed by Dr Mardelle Matte. She is very active.  PCP follows labs twice a year. She continues iron, B12 and magnesium supplements. Labs were last checked in 09/2019. Last A1C was 6.4.   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   HISTORY: (copied from my note on 02/22/2019)  Theresa Ferguson is a 76 y.o. female here today for follow up for RLS and neuropathy. She continues gabapentin '800mg'$  in the am and noon and '1600mg'$  at night. She is also using compounded  cream as needed. During the summer months she is able to exercise more and does not need the cream as often. She has a pool at her home that she uses for exercise. She is doing well today and without complaints.   History (copied from [facility] note on 06/14/2018)   Theresa Ferguson is a 76 year old female with a history of neuropathy and restless leg syndrome.  She returns today for follow-up.  She is taking 800 mg of gabapentin in the morning and at noon and 1600 mg at bedtime.  She reports that she has good days and bad days.  She states that she has about 4 bad days as of this.  She states sometimes been tingling sensation travels up the leg.  She denies any significant changes with her gait or balance.  She reports that she was unable to get the compounded cream from [facility] because of the cost  She states that her Ferguson does compounded cream and is asking that I sent her prescription there.  She returns today for follow-up.   HISTORY 06/14/17 Theresa Ferguson is a 76 year old female with a history of neuropathy and restless leg syndrome. She returns today for follow-up. She is currently taking gabapentin 800 mg in the morning and at noon and 600 mg in the evening. She reports that some weeks her pain is horrible and other weeks she does not even notice it. She states that she has numbness in the feet that extends to the ankle. She describes her discomfort as a burning tingling sensation in the feet. She describes it as if she is being stung by a bee. She states that she did break her left ankle and just recently got out of her boot. She states that she was coming downstairs and her left hip gave out and she fell. She denies any significant changes to her gait or balance due to her neuropathy. She does not use a cane or walker. She returns today for an evaluation.   REVIEW OF SYSTEMS: Out of a complete 14 system review of symptoms, the patient complains only of the following symptoms,  paresthesias, lightheadedness, restless legs, tingling of bilateral hands and nueopathy and all other reviewed systems are negative.   ALLERGIES: Allergies  Allergen Reactions   Codeine    Latex    Morphine And Related    Nickel    Penicillins     HOME MEDICATIONS: Outpatient Medications Prior to Visit  Medication Sig Dispense Refill   aspirin 81 MG tablet Take 81 mg by mouth daily.     calcium carbonate (OS-CAL - DOSED IN MG OF ELEMENTAL CALCIUM) 1250 (500 Ca) MG tablet Take 1 tablet by mouth.     celecoxib (CELEBREX) 200 MG capsule Take 200 mg by mouth daily.     cholecalciferol (VITAMIN D3) 25 MCG (1000 UNIT) tablet Take 1,000 Units by mouth daily.     escitalopram (LEXAPRO) 20 MG tablet TAKE 1 TABLET BY MOUTH ONCE DAILY 90 tablet 0   ferrous sulfate 325 (65 FE) MG  tablet Take 325 mg by mouth daily with breakfast.     fluticasone (FLONASE) 50 MCG/ACT nasal spray Place 1 spray into both nostrils as needed.     lisinopril-hydrochlorothiazide (PRINZIDE,ZESTORETIC) 20-12.5 MG per tablet Take 1 tablet by mouth daily.     magnesium oxide (MAG-OX) 400 MG tablet Take 400 mg by mouth daily.     metFORMIN (GLUCOPHAGE) 500 MG tablet Take 1,000 mg by mouth 2 (two) times daily. At bedtime     Multiple Vitamin (MULTIVITAMIN) capsule Take 1 capsule by mouth daily.     NONFORMULARY OR COMPOUNDED ITEM Amantadine 8%, Baclofen 2%, gabapentin 6%, amitriptyline 4%, bupivacaine 2%, clonidine 2%Amantadine 8%, Baclofen 2%, gabapentin 6%, amitriptyline 4%, bupivacaine 2%, clonidine 2% 1 each 11   pantoprazole (PROTONIX) 40 MG tablet Take 40 mg by mouth daily.     rosuvastatin (CRESTOR) 10 MG tablet Take 10 mg by mouth daily.     vitamin B-12 (CYANOCOBALAMIN) 1000 MCG tablet Take 1,000 mcg by mouth daily.     gabapentin (NEURONTIN) 800 MG tablet Take one by mouth at morning . Lunch and 2 at night. 360 tablet 3   anastrozole (ARIMIDEX) 1 MG tablet TAKE 1 TABLET BY MOUTH DAILY 30 tablet 3   naproxen  (NAPROSYN) 500 MG tablet Take 500 mg by mouth 2 (two) times daily.     No facility-administered medications prior to visit.    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   Personal history of radiation therapy    PONV (postoperative nausea and vomiting)    Radiculopathy of lumbosacral region 02/02/2013   RLS (restless legs syndrome) 02/05/2015   Seasonal allergies     PAST SURGICAL HISTORY: Past Surgical History:  Procedure Laterality Date   BREAST LUMPECTOMY Right 08/07/2020   right lumpectomy w/ radiation   BREAST LUMPECTOMY WITH RADIOACTIVE SEED LOCALIZATION Right 08/07/2020   Procedure: RIGHT BREAST LUMPECTOMY WITH RADIOACTIVE SEED LOCALIZATION;  Surgeon: Rolm Bookbinder, MD;  Location: [facility];  Service: General;  Laterality: Right;   FINGER SURGERY Right 08/31/1996   [facility]   kidney stones  08/31/1978   KNEE SURGERY Bilateral    partial right-2008,left full replacement-2006-Dr [facility]   LAMINECTOMY     l4-5   LUMBAR DISC SURGERY      FAMILY HISTORY: Family History  Problem Relation Age of Onset   Diabetes Son    Breast cancer Neg Hx     SOCIAL HISTORY: Social History   Socioeconomic History   Marital status: Married    Spouse name: Jeneen Rinks   Number of children: 2   Years of education: [employer] education level: Not on file  Occupational History   Occupation: retired    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   Patient has two adult children.   Patient is retired.   Patient has a college education.   Patient is right-handed.   Patient drinks two cups of coffee daily.   Social Determinants of Health   Financial Resource Strain: Not on file  Food Insecurity: Not on file  Transportation Needs: Not on file  Physical Activity: Not on file  Stress: Not on file  Social Connections: Not on file  Intimate Partner Violence: Not on file      PHYSICAL EXAM  Vitals:   02/25/22 0925  BP: (!) 146/79  Pulse: 78  Weight: 153 lb (69.4 kg)  Height: '5\' 5"'$  (1.651 m)     Body mass index is 25.46 kg/m.  Generalized: Well developed, in no acute distress  Cardiology: normal rate and rhythm, no murmur noted Respiratory: clear to auscultation bilaterally  Neurological examination  Mentation: Alert oriented to time, place, history taking. Follows all commands speech and language fluent Cranial nerve II-XII: Pupils were equal round reactive to light. Extraocular movements were full, visual field were full . Motor: The motor testing reveals 5 over 5 strength of all 4 extremities. Good symmetric motor tone is noted throughout.  Sensory: Sensory testing is intact to soft touch on all 4 extremities. Gait and station: Gait is normal.No assistive device used.      DIAGNOSTIC DATA (LABS, IMAGING, TESTING) - I reviewed patient records, labs, notes, testing and imaging myself where available.      No data to display           Lab Results  Component Value Date   WBC 3.5 (L) 07/15/2021   HGB 10.6 (L) 07/15/2021   HCT 31.1 (L) 07/15/2021   MCV 97.2 07/15/2021   PLT 135 (L) 07/15/2021      Component Value Date/Time   NA 139  12/10/2020 1307   K 4.1 12/10/2020 1307   K 4.0 09/20/2014 1223   CL 105 12/10/2020 1307   CO2 25 12/10/2020 1307   GLUCOSE 115 (H) 12/10/2020 1307   BUN 21 12/10/2020 1307   CREATININE 1.20 (H) 07/22/2021 1428   CALCIUM 9.4 12/10/2020 1307   PROT 7.3 12/10/2020 1307   ALBUMIN 4.5 12/10/2020 1307   AST 20 12/10/2020 1307   ALT 14 12/10/2020 1307   ALKPHOS 39 12/10/2020 1307   BILITOT 0.6 12/10/2020 1307   GFRNONAA 58 (L) 12/10/2020 1307   No results found for: "CHOL", "HDL", "LDLCALC", "LDLDIRECT", "TRIG", "CHOLHDL" No results found for: "HGBA1C" Lab Results  Component Value Date   VITAMINB12 563 04/11/2021   No results found for: "TSH"     ASSESSMENT AND PLAN 76 y.o. year old female  has a past medical history of Anemia, Anxiety, Arthritis, Cancer ([place]) (82/9562), Complication of anesthesia, CTS (carpal tunnel syndrome), GERD (gastroesophageal reflux disease), Hyperlipemia, Hypertension, Neuropathy in diabetes ([place]) (02/02/2013), Personal history of radiation therapy, PONV (postoperative nausea and vomiting), Radiculopathy of lumbosacral region (02/02/2013), RLS (restless legs syndrome) (02/05/2015), and Seasonal allergies. here with     ICD-10-CM   1. RLS (restless legs syndrome)  G25.81 gabapentin (NEURONTIN) 800 MG tablet    2. Neuropathy  G62.9 gabapentin (NEURONTIN) 800 MG tablet      Theresa Ferguson is doing fairly well today from a neuropathy standpoint.  She continues gabapentin '800mg'$  in am, '800mg'$  early afternoon and '1600mg'$  at bedtime. And alpha lipoic acid supplements daily. She also uses neuropathy cream as needed. We will continue current treatment plan. We have reviewed side effects of gabapentin. She feels that it helps neuropathy and RLS symptoms significantly. She has tolerated gabapentin well. May consider reducing dose if she continues to have difficulty with BP. I have advised she continue monitoring water intake. She will follow up closely with PCP and oncology to  discuss symptoms. She was encouraged to make position changes slowly. She will follow up annually, sooner if needed. She verbalizes understanding and agreement with this plan.    No orders of the defined types were placed in this encounter.     Meds ordered this encounter  Medications   gabapentin (NEURONTIN) 800 MG tablet    Sig: Take one by mouth at morning . Lunch and 2 at night.    Dispense:  360 tablet    Refill:  3    Order Specific Question:   Supervising Provider    Answer:   Bess Harvest, FNP-C 02/25/2022, 10:35 AM [facility] [address], [place], [place] [address] [phone]

## 2022-03-06 DIAGNOSIS — M7661 Achilles tendinitis, right leg: Secondary | ICD-10-CM | POA: Diagnosis not present

## 2022-03-24 DIAGNOSIS — R262 Difficulty in walking, not elsewhere classified: Secondary | ICD-10-CM | POA: Diagnosis not present

## 2022-03-24 DIAGNOSIS — M79671 Pain in right foot: Secondary | ICD-10-CM | POA: Diagnosis not present

## 2022-03-31 DIAGNOSIS — Z1331 Encounter for screening for depression: Secondary | ICD-10-CM | POA: Diagnosis not present

## 2022-03-31 DIAGNOSIS — Z0000 Encounter for general adult medical examination without abnormal findings: Secondary | ICD-10-CM | POA: Diagnosis not present

## 2022-03-31 DIAGNOSIS — N182 Chronic kidney disease, stage 2 (mild): Secondary | ICD-10-CM | POA: Diagnosis not present

## 2022-03-31 DIAGNOSIS — F39 Unspecified mood [affective] disorder: Secondary | ICD-10-CM | POA: Diagnosis not present

## 2022-03-31 DIAGNOSIS — M5136 Other intervertebral disc degeneration, lumbar region: Secondary | ICD-10-CM | POA: Diagnosis not present

## 2022-03-31 DIAGNOSIS — M79671 Pain in right foot: Secondary | ICD-10-CM | POA: Diagnosis not present

## 2022-03-31 DIAGNOSIS — R262 Difficulty in walking, not elsewhere classified: Secondary | ICD-10-CM | POA: Diagnosis not present

## 2022-03-31 DIAGNOSIS — K219 Gastro-esophageal reflux disease without esophagitis: Secondary | ICD-10-CM | POA: Diagnosis not present

## 2022-03-31 DIAGNOSIS — J309 Allergic rhinitis, unspecified: Secondary | ICD-10-CM | POA: Diagnosis not present

## 2022-03-31 DIAGNOSIS — E1121 Type 2 diabetes mellitus with diabetic nephropathy: Secondary | ICD-10-CM | POA: Diagnosis not present

## 2022-03-31 DIAGNOSIS — E782 Mixed hyperlipidemia: Secondary | ICD-10-CM | POA: Diagnosis not present

## 2022-03-31 DIAGNOSIS — E1149 Type 2 diabetes mellitus with other diabetic neurological complication: Secondary | ICD-10-CM | POA: Diagnosis not present

## 2022-03-31 DIAGNOSIS — I129 Hypertensive chronic kidney disease with stage 1 through stage 4 chronic kidney disease, or unspecified chronic kidney disease: Secondary | ICD-10-CM | POA: Diagnosis not present

## 2022-03-31 DIAGNOSIS — D509 Iron deficiency anemia, unspecified: Secondary | ICD-10-CM | POA: Diagnosis not present

## 2022-04-02 DIAGNOSIS — R262 Difficulty in walking, not elsewhere classified: Secondary | ICD-10-CM | POA: Diagnosis not present

## 2022-04-02 DIAGNOSIS — M79671 Pain in right foot: Secondary | ICD-10-CM | POA: Diagnosis not present

## 2022-04-06 DIAGNOSIS — M7661 Achilles tendinitis, right leg: Secondary | ICD-10-CM | POA: Diagnosis not present

## 2022-04-07 DIAGNOSIS — R262 Difficulty in walking, not elsewhere classified: Secondary | ICD-10-CM | POA: Diagnosis not present

## 2022-04-07 DIAGNOSIS — M79671 Pain in right foot: Secondary | ICD-10-CM | POA: Diagnosis not present

## 2022-04-09 DIAGNOSIS — R262 Difficulty in walking, not elsewhere classified: Secondary | ICD-10-CM | POA: Diagnosis not present

## 2022-04-09 DIAGNOSIS — M79671 Pain in right foot: Secondary | ICD-10-CM | POA: Diagnosis not present

## 2022-04-13 ENCOUNTER — Other Ambulatory Visit: Payer: Self-pay

## 2022-04-13 ENCOUNTER — Inpatient Hospital Stay: Payer: Medicare Other | Admitting: Nurse Practitioner

## 2022-04-13 DIAGNOSIS — D508 Other iron deficiency anemias: Secondary | ICD-10-CM

## 2022-04-14 DIAGNOSIS — M79671 Pain in right foot: Secondary | ICD-10-CM | POA: Diagnosis not present

## 2022-04-14 DIAGNOSIS — R262 Difficulty in walking, not elsewhere classified: Secondary | ICD-10-CM | POA: Diagnosis not present

## 2022-04-16 DIAGNOSIS — R262 Difficulty in walking, not elsewhere classified: Secondary | ICD-10-CM | POA: Diagnosis not present

## 2022-04-16 DIAGNOSIS — M79671 Pain in right foot: Secondary | ICD-10-CM | POA: Diagnosis not present

## 2022-04-17 ENCOUNTER — Encounter: Payer: Self-pay | Admitting: Nurse Practitioner

## 2022-04-17 ENCOUNTER — Inpatient Hospital Stay (HOSPITAL_BASED_OUTPATIENT_CLINIC_OR_DEPARTMENT_OTHER): Payer: Medicare Other | Admitting: Nurse Practitioner

## 2022-04-17 ENCOUNTER — Inpatient Hospital Stay: Payer: Medicare Other | Attending: Oncology

## 2022-04-17 VITALS — BP 139/82 | HR 83 | Temp 98.0°F | Resp 16 | Wt 152.6 lb

## 2022-04-17 DIAGNOSIS — C50411 Malignant neoplasm of upper-outer quadrant of right female breast: Secondary | ICD-10-CM | POA: Diagnosis not present

## 2022-04-17 DIAGNOSIS — D509 Iron deficiency anemia, unspecified: Secondary | ICD-10-CM | POA: Insufficient documentation

## 2022-04-17 DIAGNOSIS — D508 Other iron deficiency anemias: Secondary | ICD-10-CM

## 2022-04-17 DIAGNOSIS — Z923 Personal history of irradiation: Secondary | ICD-10-CM | POA: Diagnosis not present

## 2022-04-17 DIAGNOSIS — Z853 Personal history of malignant neoplasm of breast: Secondary | ICD-10-CM | POA: Insufficient documentation

## 2022-04-17 DIAGNOSIS — D72819 Decreased white blood cell count, unspecified: Secondary | ICD-10-CM | POA: Insufficient documentation

## 2022-04-17 DIAGNOSIS — Z170 Estrogen receptor positive status [ER+]: Secondary | ICD-10-CM

## 2022-04-17 DIAGNOSIS — Z08 Encounter for follow-up examination after completed treatment for malignant neoplasm: Secondary | ICD-10-CM | POA: Diagnosis not present

## 2022-04-17 LAB — CBC
HCT: 32.1 % — ABNORMAL LOW (ref 36.0–46.0)
Hemoglobin: 10.9 g/dL — ABNORMAL LOW (ref 12.0–15.0)
MCH: 33.1 pg (ref 26.0–34.0)
MCHC: 34 g/dL (ref 30.0–36.0)
MCV: 97.6 fL (ref 80.0–100.0)
Platelets: 151 10*3/uL (ref 150–400)
RBC: 3.29 MIL/uL — ABNORMAL LOW (ref 3.87–5.11)
RDW: 12 % (ref 11.5–15.5)
WBC: 3.6 10*3/uL — ABNORMAL LOW (ref 4.0–10.5)
nRBC: 0 % (ref 0.0–0.2)

## 2022-04-17 LAB — FERRITIN: Ferritin: 136 ng/mL (ref 11–307)

## 2022-04-17 NOTE — Progress Notes (Signed)
Hematology/Oncology Consult Note [facility]  [phone] [phone]  Patient Care Team: Kelton Pillar, MD as PCP - General (Family Medicine) Noreene Filbert, MD as Consulting Physician (Radiation Oncology) Sindy Guadeloupe, MD as Consulting Physician (Oncology) Rolm Bookbinder, MD as Consulting Physician (General Surgery)   Name of the patient: Theresa Ferguson  [id number]  [birth year]   Date of visit: 04/17/22  Diagnosis-stage I right breast cancer Normocytic anemia  Chief complaint/ Reason for visit-routine follow-up of right breast cancer to discuss hormone therapy options Discussed work-up for anemia  Heme/Onc history: Patient is a 76 year old female who underwent routine bilateral screening mammogram in October 2021 which showed a possible distortion in the right breast.  This was followed by diagnostic mammogram and ultrasound which showed a 1.1 x 1.3 x 1.2 cm mass at the 9:30 position in the right breast.  No adenopathy noted in the right axilla.  Ultrasound-guided biopsy showed grade 1 ER PR positive HER-2 negative tumor with a Ki-67 of 10%.  Patient has met with Dr. Donne Hazel and plan is for a lumpectomy.  Based on choosing wisely guidelines sentinel lymph node biopsy was deemed to be not necessary.    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  Grade 2 Ki-67 10% ER 95% PR 95% positive and HER-2 negative by FISH.  Oncotype came back at low risk and patient did not require any adjuvant chemotherapy.  She completed adjuvant radiation treatment and started Arimidex in March 2022.  She held it in July 2020 due to side effects  Interval history- Patient is 76 year old female who returns to clinic for continued surveillance of breast cancer and follow up for iron deficiency and chronic pancytopenia. She feels well and other than mild fatigue, she feels well. She is active and reports improved quality of life  since stopping arimidex.   ECOG PS- 0 Pain scale- 0  Review of systems- Review of Systems  Constitutional:  Positive for malaise/fatigue. Negative for chills, fever and weight loss.  HENT:  Negative for congestion, ear discharge and nosebleeds.   Eyes:  Negative for blurred vision.  Respiratory:  Negative for cough, hemoptysis, sputum production, shortness of breath and wheezing.   Cardiovascular:  Negative for chest pain, palpitations, orthopnea and claudication.  Gastrointestinal:  Negative for abdominal pain, blood in stool, constipation, diarrhea, heartburn, melena, nausea and vomiting.  Genitourinary:  Negative for dysuria, flank pain, frequency, hematuria and urgency.  Musculoskeletal:  Negative for back pain, joint pain and myalgias.  Skin:  Negative for rash.  Neurological:  Negative for dizziness, tingling, focal weakness, seizures, weakness and headaches.  Endo/Heme/Allergies:  Does not bruise/bleed easily.  Psychiatric/Behavioral:  Negative for depression and suicidal ideas. The patient does not have insomnia.     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   Personal history of radiation therapy    PONV (postoperative nausea and vomiting)    Radiculopathy of lumbosacral region 02/02/2013  RLS (restless legs syndrome) 02/05/2015   Seasonal allergies    Past Surgical History:  Procedure Laterality Date   BREAST LUMPECTOMY Right 08/07/2020   right lumpectomy w/ radiation   BREAST LUMPECTOMY WITH RADIOACTIVE SEED LOCALIZATION Right 08/07/2020   Procedure: RIGHT BREAST LUMPECTOMY WITH RADIOACTIVE  SEED LOCALIZATION;  Surgeon: Rolm Bookbinder, MD;  Location: [place];  Service: General;  Laterality: Right;   FINGER SURGERY Right 08/31/1996   [facility]   kidney stones  08/31/1978   KNEE SURGERY Bilateral    partial right-2008,left full replacement-2006-Dr [facility]   LAMINECTOMY     l4-5   LUMBAR DISC SURGERY     Social History   Socioeconomic History   Marital status: Married    Spouse name: Jeneen Rinks   Number of children: 2   Years of education: College   Highest education level: Not on file  Occupational History   Occupation: retired    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   Patient has two adult children.   Patient is retired.   Patient has a college education.   Patient is right-handed.   Patient drinks two cups of coffee daily.   Social Determinants of Health   Financial Resource Strain: Not on file  Food Insecurity: Not on file  Transportation Needs: Not on file  Physical Activity: Not on file  Stress: Not on file  Social Connections: Not on file  Intimate Partner Violence: Not on file   Family History  Problem Relation Age of Onset   Diabetes Son    Breast cancer Neg Hx    Current Outpatient Medications:    calcium carbonate (OS-CAL - DOSED IN MG OF ELEMENTAL CALCIUM) 1250 (500 Ca) MG tablet, Take 1 tablet by mouth., Disp: , Rfl:    cholecalciferol (VITAMIN D3) 25 MCG (1000 UNIT) tablet, Take 1,000 Units by mouth daily., Disp: , Rfl:     escitalopram (LEXAPRO) 20 MG tablet, TAKE 1 TABLET BY MOUTH ONCE DAILY, Disp: 90 tablet, Rfl: 0   ferrous sulfate 325 (65 FE) MG tablet, Take 325 mg by mouth daily with breakfast., Disp: , Rfl:    fluticasone (FLONASE) 50 MCG/ACT nasal spray, Place 1 spray into both nostrils as needed., Disp: , Rfl:    gabapentin (NEURONTIN) 800 MG tablet, Take one by mouth at morning . Lunch and 2 at night., Disp: 360 tablet, Rfl: 3   magnesium oxide (MAG-OX) 400 MG tablet, Take 400 mg by mouth daily., Disp: , Rfl:    metFORMIN (GLUCOPHAGE) 500 MG tablet, Take 1,000 mg by mouth 2 (two) times daily. At bedtime, Disp: , Rfl:    Multiple Vitamin (MULTIVITAMIN) capsule, Take 1 capsule by mouth daily., Disp: , Rfl:    pantoprazole (PROTONIX) 40 MG tablet, Take 40 mg by mouth daily., Disp: , Rfl:    rosuvastatin (CRESTOR) 10 MG tablet, Take 10 mg by mouth daily., Disp: ,  Rfl:    vitamin B-12 (CYANOCOBALAMIN) 1000 MCG tablet, Take 1,000 mcg by mouth daily., Disp: , Rfl:    aspirin 81 MG tablet, Take 81 mg by mouth daily. (Patient not taking: Reported on 04/17/2022), Disp: , Rfl:    celecoxib (CELEBREX) 200 MG capsule, Take 200 mg by mouth daily. (Patient not taking: Reported on 04/17/2022), Disp: , Rfl:    lisinopril-hydrochlorothiazide (PRINZIDE,ZESTORETIC) 20-12.5 MG per tablet, Take 1 tablet by mouth daily. (Patient not taking: Reported on 04/17/2022), Disp: , Rfl:    NONFORMULARY OR COMPOUNDED ITEM, Amantadine 8%, Baclofen 2%, gabapentin 6%, amitriptyline 4%, bupivacaine 2%, clonidine 2%Amantadine 8%, Baclofen 2%, gabapentin 6%, amitriptyline 4%, bupivacaine 2%, clonidine 2%, Disp: 1 each, Rfl: 11  Physical exam:  Vitals:   04/17/22 1046  BP: 139/82  Pulse: 83  Resp: 16  Temp: 98 F (36.7 C)  SpO2: 98%  Weight: 152 lb 9.6 oz (69.2 kg)   Physical Exam Constitutional:      General: She is not in acute distress. Cardiovascular:     Rate and Rhythm: Normal rate and regular rhythm.     Heart sounds: Normal heart  sounds.  Pulmonary:     Effort: Pulmonary effort is normal.     Breath sounds: Normal breath sounds.  Abdominal:     General: Bowel sounds are normal.     Palpations: Abdomen is soft.  Skin:    General: Skin is warm and dry.  Neurological:     Mental Status: She is alert and oriented to person, place, and time.   Breast exam- deferred. Exam was negative with surgery.      Latest Ref Rng & Units 07/22/2021    2:28 PM  CMP  Creatinine 0.44 - 1.00 mg/dL 5.27       Latest Ref Rng & Units 04/17/2022   10:29 AM  CBC  WBC 4.0 - 10.5 K/uL 3.6   Hemoglobin 12.0 - 15.0 g/dL 66.4   Hematocrit 15.4 - 46.0 % 32.1   Platelets 150 - 400 K/uL 151    Iron/TIBC/Ferritin/ %Sat    Component Value Date/Time   IRON 82 07/15/2021 1328   IRON 71 02/05/2015 1446   TIBC 294 07/15/2021 1328   TIBC 322 02/05/2015 1446   FERRITIN 161 07/15/2021 1328   FERRITIN 93 02/05/2015 1446   IRONPCTSAT 28 07/15/2021 1328   IRONPCTSAT 22 02/05/2015 1446     Assessment and plan- Patient is a 76 y.o. female who is here for follow up of following issues:  Right breast cancer: s/p lumpectomy & adjuvant radiation. Completed 4 months of arimidex. Unable to tolerate d/t side effects. Absolute survival benefit was calculated at about 2% therefore she elected to not trial other AIs or tamoxifen. She continues surveillance. Last mammogram was 08/27/21- which was negative for malignancy. She will continue annual mammogram which have been scheduled by surgery, Dr. Dwain Sarna. Next in December 2023.  Normocytic anemia/leukopenia/thrombocytopenia- baseline around 10. Some mild thrombocytopenia and leukopenia with borderline neutropenia. Hmg today 10.6. Normocytic. WBC 3.5. Plt 135. Stable. Reviewed that there could be a role for a bone marrow biopsy in the future if she has a continued downtrend in her wbcs. None currently. Monitor.  Iron Deficiency- received IV venofer x 5 in August 2022. Mild fatigue but otherwise  asymptomatic. Ferritin and iron studies pending at time of visit. She will continue oral iron for now. If low ferritin, consider iv iron. Plan to check b12 at next visit. Was 563 in 2022.  Disposition:  3 mo- lab (cbc, ferritin, iron studies, retic panel, b12), Dr Janese Banks    Visit Diagnosis 1. Encounter for follow-up surveillance of breast cancer   2. Malignant neoplasm of upper-outer quadrant of right breast in female, estrogen receptor positive ([place])   3. Chronic leukopenia   4. Iron deficiency anemia, unspecified iron deficiency anemia type    Beckey Rutter, DNP, AGNP-C [place] at [facility] [phone] (clinic) 04/17/2022

## 2022-04-21 DIAGNOSIS — R262 Difficulty in walking, not elsewhere classified: Secondary | ICD-10-CM | POA: Diagnosis not present

## 2022-04-21 DIAGNOSIS — M79671 Pain in right foot: Secondary | ICD-10-CM | POA: Diagnosis not present

## 2022-04-23 DIAGNOSIS — R262 Difficulty in walking, not elsewhere classified: Secondary | ICD-10-CM | POA: Diagnosis not present

## 2022-04-23 DIAGNOSIS — M79671 Pain in right foot: Secondary | ICD-10-CM | POA: Diagnosis not present

## 2022-04-28 DIAGNOSIS — M79671 Pain in right foot: Secondary | ICD-10-CM | POA: Diagnosis not present

## 2022-04-28 DIAGNOSIS — R262 Difficulty in walking, not elsewhere classified: Secondary | ICD-10-CM | POA: Diagnosis not present

## 2022-05-06 DIAGNOSIS — R262 Difficulty in walking, not elsewhere classified: Secondary | ICD-10-CM | POA: Diagnosis not present

## 2022-05-06 DIAGNOSIS — M79671 Pain in right foot: Secondary | ICD-10-CM | POA: Diagnosis not present

## 2022-05-07 DIAGNOSIS — I868 Varicose veins of other specified sites: Secondary | ICD-10-CM | POA: Diagnosis not present

## 2022-05-07 DIAGNOSIS — I129 Hypertensive chronic kidney disease with stage 1 through stage 4 chronic kidney disease, or unspecified chronic kidney disease: Secondary | ICD-10-CM | POA: Diagnosis not present

## 2022-05-07 DIAGNOSIS — N182 Chronic kidney disease, stage 2 (mild): Secondary | ICD-10-CM | POA: Diagnosis not present

## 2022-05-08 DIAGNOSIS — R262 Difficulty in walking, not elsewhere classified: Secondary | ICD-10-CM | POA: Diagnosis not present

## 2022-05-08 DIAGNOSIS — M79671 Pain in right foot: Secondary | ICD-10-CM | POA: Diagnosis not present

## 2022-05-20 DIAGNOSIS — R262 Difficulty in walking, not elsewhere classified: Secondary | ICD-10-CM | POA: Diagnosis not present

## 2022-05-20 DIAGNOSIS — M79671 Pain in right foot: Secondary | ICD-10-CM | POA: Diagnosis not present

## 2022-05-26 DIAGNOSIS — M79671 Pain in right foot: Secondary | ICD-10-CM | POA: Diagnosis not present

## 2022-05-26 DIAGNOSIS — R262 Difficulty in walking, not elsewhere classified: Secondary | ICD-10-CM | POA: Diagnosis not present

## 2022-05-28 DIAGNOSIS — M79671 Pain in right foot: Secondary | ICD-10-CM | POA: Diagnosis not present

## 2022-05-28 DIAGNOSIS — R262 Difficulty in walking, not elsewhere classified: Secondary | ICD-10-CM | POA: Diagnosis not present

## 2022-06-02 DIAGNOSIS — M79671 Pain in right foot: Secondary | ICD-10-CM | POA: Diagnosis not present

## 2022-06-02 DIAGNOSIS — R262 Difficulty in walking, not elsewhere classified: Secondary | ICD-10-CM | POA: Diagnosis not present

## 2022-06-04 DIAGNOSIS — M79671 Pain in right foot: Secondary | ICD-10-CM | POA: Diagnosis not present

## 2022-06-04 DIAGNOSIS — R262 Difficulty in walking, not elsewhere classified: Secondary | ICD-10-CM | POA: Diagnosis not present

## 2022-06-08 DIAGNOSIS — M79671 Pain in right foot: Secondary | ICD-10-CM | POA: Diagnosis not present

## 2022-06-08 DIAGNOSIS — R262 Difficulty in walking, not elsewhere classified: Secondary | ICD-10-CM | POA: Diagnosis not present

## 2022-06-18 DIAGNOSIS — R262 Difficulty in walking, not elsewhere classified: Secondary | ICD-10-CM | POA: Diagnosis not present

## 2022-06-18 DIAGNOSIS — M79671 Pain in right foot: Secondary | ICD-10-CM | POA: Diagnosis not present

## 2022-06-24 DIAGNOSIS — E785 Hyperlipidemia, unspecified: Secondary | ICD-10-CM | POA: Insufficient documentation

## 2022-06-24 DIAGNOSIS — I8310 Varicose veins of unspecified lower extremity with inflammation: Secondary | ICD-10-CM | POA: Insufficient documentation

## 2022-06-24 DIAGNOSIS — E119 Type 2 diabetes mellitus without complications: Secondary | ICD-10-CM | POA: Insufficient documentation

## 2022-06-24 DIAGNOSIS — I872 Venous insufficiency (chronic) (peripheral): Secondary | ICD-10-CM | POA: Insufficient documentation

## 2022-06-24 NOTE — Progress Notes (Signed)
MRN : [id number]  Theresa Ferguson is a 75 y.o. (Jan 17, 1946) female who presents with chief complaint of varicose veins hurt.  History of Present Illness:   The patient is seen for evaluation of painful lower extremities. Patient notes her legs always feel cold and kind of hurt.  The feelings are not always associated with activity.  The discomfort is somewhat consistent day to day occurring on most days. The patient notes the discomfort also occurs with standing and routinely seems worse as the day wears on. The pain has been progressive over the past several months. The patient states these symptoms are causing  a negative impact on quality of life and daily activities which was a factor in the referral.  She has also noted a few varicose veins which also seem to be tender and bother her.  The patient has a  history of back problems and DJD of the lumbar and sacral spine as well as documented neuropathy.   The patient denies rest pain or dangling of an extremity off the side of the bed during the night for relief. No open wounds or sores at this time. No history of DVT or phlebitis. No prior vascular interventions or surgeries.  Duplex ultrasound of the venous system bilateral lower extremities shows normal deep system bilaterally with trivial superficial reflux noted on the left no superficial reflux noted on the right   No outpatient medications have been marked as taking for the 06/29/22 encounter (Appointment) with Delana Meyer, Dolores Lory, MD.    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   Personal history of radiation therapy    PONV (postoperative nausea and vomiting)    Radiculopathy of lumbosacral region  02/02/2013   RLS (restless legs syndrome) 02/05/2015   Seasonal allergies     Past Surgical History:  Procedure Laterality Date   BREAST LUMPECTOMY Right 08/07/2020   right lumpectomy w/ radiation   BREAST LUMPECTOMY WITH RADIOACTIVE SEED LOCALIZATION Right 08/07/2020   Procedure: RIGHT BREAST LUMPECTOMY WITH RADIOACTIVE SEED LOCALIZATION;  Surgeon: Rolm Bookbinder, MD;  Location: [place];  Service: General;  Laterality: Right;   FINGER SURGERY Right 08/31/1996   [facility]   kidney stones  08/31/1978   KNEE SURGERY Bilateral    partial right-2008,left full replacement-2006-Dr [facility]   LAMINECTOMY     l4-5   LUMBAR DISC SURGERY      Social History Social History   Tobacco Use   Smoking status: Former    Types: Cigarettes    Quit date: 1982    Years since quitting: 41.8   Smokeless tobacco: Never  Vaping Use   Vaping Use: Never used  Substance Use Topics   Alcohol use: Yes    Comment: socially   Drug use: No    Family History Family History  Problem Relation Age of Onset   Diabetes Son    Breast cancer Neg Hx     Allergies  Allergen Reactions   Codeine    Latex    Morphine And Related    Nickel    Penicillins      REVIEW OF SYSTEMS (  Negative unless checked)  Constitutional: '[]'$ Weight loss  '[]'$ Fever  '[]'$ Chills Cardiac: '[]'$ Chest pain   '[]'$ Chest pressure   '[]'$ Palpitations   '[]'$ Shortness of breath when laying flat   '[]'$ Shortness of breath with exertion. Vascular:  '[]'$ Pain in legs with walking   '[x]'$ Pain in legs with standing  '[]'$ History of DVT   '[]'$ Phlebitis   '[]'$ Swelling in legs   '[x]'$ Varicose veins   '[]'$ Non-healing ulcers Pulmonary:   '[]'$ Uses home oxygen   '[]'$ Productive cough   '[]'$ Hemoptysis   '[]'$ Wheeze  '[]'$ COPD   '[]'$ Asthma Neurologic:  '[]'$ Dizziness   '[]'$ Seizures   '[]'$ History of stroke   '[]'$ History of TIA  '[]'$ Aphasia   '[]'$ Vissual changes   '[]'$ Weakness or numbness in arm   '[]'$ Weakness or numbness in leg Musculoskeletal:   '[]'$ Joint swelling   '[x]'$ Joint pain    '[x]'$ Low back pain Hematologic:  '[]'$ Easy bruising  '[]'$ Easy bleeding   '[]'$ Hypercoagulable state   '[]'$ Anemic Gastrointestinal:  '[]'$ Diarrhea   '[]'$ Vomiting  '[]'$ Gastroesophageal reflux/heartburn   '[]'$ Difficulty swallowing. Genitourinary:  '[]'$ Chronic kidney disease   '[]'$ Difficult urination  '[]'$ Frequent urination   '[]'$ Blood in urine Skin:  '[]'$ Rashes   '[]'$ Ulcers  Psychological:  '[]'$ History of anxiety   '[]'$  History of major depression.  Physical Examination  There were no vitals filed for this visit. There is no height or weight on file to calculate BMI. Gen: WD/WN, NAD Head: [place]/AT, No temporalis wasting.  Ear/Nose/Throat: Hearing grossly intact, nares w/o erythema or drainage, pinna without lesions Eyes: PER, EOMI, sclera nonicteric.  Neck: Supple, no gross masses.  No JVD.  Pulmonary:  Good air movement, no audible wheezing, no use of accessory muscles.  Cardiac: RRR, precordium not hyperdynamic. Vascular: Several varicosities present, greater than 5 mm bilaterally.  Veins are not tender to palpation  Mild venous stasis changes to the legs bilaterally.  Trace soft pitting edema CEAP C3sEpAsPr Vessel Right Left  Radial Palpable Palpable  PT Nonpalpable Not palpable  DP Not palpable Not palpable  Gastrointestinal: soft, non-distended. No guarding/no peritoneal signs.  Musculoskeletal: M/S 5/5 throughout.  No deformity.  Neurologic: CN 2-12 intact. Pain and light touch intact in extremities.  Symmetrical.  Speech is fluent. Motor exam as listed above. Psychiatric: Judgment intact, Mood & affect appropriate for pt's clinical situation. Dermatologic: Venous rashes no ulcers noted.  No changes consistent with cellulitis. Lymph : No lichenification or skin changes of chronic lymphedema.  CBC Lab Results  Component Value Date   WBC 3.6 (L) 04/17/2022   HGB 10.9 (L) 04/17/2022   HCT 32.1 (L) 04/17/2022   MCV 97.6 04/17/2022   PLT 151 04/17/2022    BMET    Component Value Date/Time   NA 139 12/10/2020 1307    K 4.1 12/10/2020 1307   K 4.0 09/20/2014 1223   CL 105 12/10/2020 1307   CO2 25 12/10/2020 1307   GLUCOSE 115 (H) 12/10/2020 1307   BUN 21 12/10/2020 1307   CREATININE 1.20 (H) 07/22/2021 1428   CALCIUM 9.4 12/10/2020 1307   GFRNONAA 58 (L) 12/10/2020 1307   CrCl cannot be calculated (Patient's most recent lab result is older than the maximum 21 days allowed.).  COAG No results found for: "INR", "PROTIME"  Radiology No results found.   Assessment/Plan 1. Pain in both lower extremities  Recommend:  The patient has atypical pain symptoms for pure atherosclerotic disease. However, on physical exam there is evidence of vascular disease, given the diminished pulses and the edema associated with venous changes of the legs.  Further investigation of the patient's vascular  disease is necessary to determine the relationship of the patient's lower extremity symptoms and the degree of vascular disease.  Noninvasive studies of the will be obtained and the patient will follow up with me to review these studies.  I suspect the patient is c/o pseudoclaudication.  Patient should have an evaluation of his LS spine which I defer to either the primary service or the Spine service.  The patient should continue walking and begin a more formal exercise program. The patient should continue his antiplatelet therapy and aggressive treatment of the lipid abnormalities.   2. Varicose veins with inflammation See #1  3. Radiculopathy of lumbosacral region I believe the patient's lower extremity symptoms are most likely related to her neuropathy and radiculopathy.  I will obtain ABIs to better assess her level of PAD.  Venous ultrasound did not show significant venous issues.  4. Type 2 diabetes mellitus with other specified complication, unspecified whether long term insulin use ([place]) Continue hypoglycemic medications as already ordered, these medications have been reviewed and there are no changes at  this time.  Hgb A1C to be monitored as already arranged by primary service   5. Mixed hyperlipidemia Continue statin as ordered and reviewed, no changes at this time   6. Varicose veins of left calf See #1 - VAS [place] LOWER EXTREMITY VENOUS REFLUX    Hortencia Pilar, MD  06/24/2022 2:57 PM

## 2022-06-25 ENCOUNTER — Other Ambulatory Visit (INDEPENDENT_AMBULATORY_CARE_PROVIDER_SITE_OTHER): Payer: Self-pay | Admitting: Nurse Practitioner

## 2022-06-25 DIAGNOSIS — I8392 Asymptomatic varicose veins of left lower extremity: Secondary | ICD-10-CM

## 2022-06-29 ENCOUNTER — Encounter (INDEPENDENT_AMBULATORY_CARE_PROVIDER_SITE_OTHER): Payer: Self-pay | Admitting: Vascular Surgery

## 2022-06-29 ENCOUNTER — Ambulatory Visit (INDEPENDENT_AMBULATORY_CARE_PROVIDER_SITE_OTHER): Payer: Medicare Other | Admitting: Vascular Surgery

## 2022-06-29 ENCOUNTER — Ambulatory Visit (INDEPENDENT_AMBULATORY_CARE_PROVIDER_SITE_OTHER): Payer: Medicare Other

## 2022-06-29 ENCOUNTER — Encounter (INDEPENDENT_AMBULATORY_CARE_PROVIDER_SITE_OTHER): Payer: Self-pay

## 2022-06-29 VITALS — BP 148/79 | HR 88 | Resp 18 | Ht 65.0 in | Wt 153.4 lb

## 2022-06-29 DIAGNOSIS — I8392 Asymptomatic varicose veins of left lower extremity: Secondary | ICD-10-CM

## 2022-06-29 DIAGNOSIS — M79604 Pain in right leg: Secondary | ICD-10-CM

## 2022-06-29 DIAGNOSIS — E782 Mixed hyperlipidemia: Secondary | ICD-10-CM | POA: Diagnosis not present

## 2022-06-29 DIAGNOSIS — M79605 Pain in left leg: Secondary | ICD-10-CM | POA: Diagnosis not present

## 2022-06-29 DIAGNOSIS — E1169 Type 2 diabetes mellitus with other specified complication: Secondary | ICD-10-CM

## 2022-06-29 DIAGNOSIS — M5417 Radiculopathy, lumbosacral region: Secondary | ICD-10-CM | POA: Diagnosis not present

## 2022-06-29 DIAGNOSIS — I8310 Varicose veins of unspecified lower extremity with inflammation: Secondary | ICD-10-CM | POA: Diagnosis not present

## 2022-06-29 DIAGNOSIS — I872 Venous insufficiency (chronic) (peripheral): Secondary | ICD-10-CM

## 2022-06-30 ENCOUNTER — Other Ambulatory Visit: Payer: Medicare Other

## 2022-06-30 ENCOUNTER — Encounter (INDEPENDENT_AMBULATORY_CARE_PROVIDER_SITE_OTHER): Payer: Self-pay | Admitting: Vascular Surgery

## 2022-06-30 DIAGNOSIS — M79606 Pain in leg, unspecified: Secondary | ICD-10-CM | POA: Insufficient documentation

## 2022-07-01 DIAGNOSIS — M79671 Pain in right foot: Secondary | ICD-10-CM | POA: Diagnosis not present

## 2022-07-01 DIAGNOSIS — R262 Difficulty in walking, not elsewhere classified: Secondary | ICD-10-CM | POA: Diagnosis not present

## 2022-07-02 ENCOUNTER — Inpatient Hospital Stay: Admission: RE | Admit: 2022-07-02 | Payer: Medicare Other | Source: Ambulatory Visit

## 2022-07-02 DIAGNOSIS — R509 Fever, unspecified: Secondary | ICD-10-CM | POA: Diagnosis not present

## 2022-07-02 DIAGNOSIS — Z03818 Encounter for observation for suspected exposure to other biological agents ruled out: Secondary | ICD-10-CM | POA: Diagnosis not present

## 2022-07-02 DIAGNOSIS — R051 Acute cough: Secondary | ICD-10-CM | POA: Diagnosis not present

## 2022-07-02 DIAGNOSIS — J988 Other specified respiratory disorders: Secondary | ICD-10-CM | POA: Diagnosis not present

## 2022-07-02 DIAGNOSIS — I129 Hypertensive chronic kidney disease with stage 1 through stage 4 chronic kidney disease, or unspecified chronic kidney disease: Secondary | ICD-10-CM | POA: Diagnosis not present

## 2022-07-02 DIAGNOSIS — R059 Cough, unspecified: Secondary | ICD-10-CM | POA: Diagnosis not present

## 2022-07-09 ENCOUNTER — Ambulatory Visit
Admission: RE | Admit: 2022-07-09 | Discharge: 2022-07-09 | Disposition: A | Discharge status: Home / Self Care | Payer: Medicare Other | Source: Ambulatory Visit | Attending: Otolaryngology | Admitting: Otolaryngology

## 2022-07-09 DIAGNOSIS — R221 Localized swelling, mass and lump, neck: Secondary | ICD-10-CM | POA: Diagnosis not present

## 2022-07-09 DIAGNOSIS — K118 Other diseases of salivary glands: Secondary | ICD-10-CM | POA: Diagnosis not present

## 2022-07-29 ENCOUNTER — Ambulatory Visit (INDEPENDENT_AMBULATORY_CARE_PROVIDER_SITE_OTHER): Payer: Medicare Other | Admitting: Nurse Practitioner

## 2022-07-29 ENCOUNTER — Encounter (INDEPENDENT_AMBULATORY_CARE_PROVIDER_SITE_OTHER): Payer: Self-pay | Admitting: Nurse Practitioner

## 2022-07-29 ENCOUNTER — Ambulatory Visit (INDEPENDENT_AMBULATORY_CARE_PROVIDER_SITE_OTHER): Payer: Medicare Other

## 2022-07-29 VITALS — BP 147/86 | HR 86 | Resp 16 | Wt 149.8 lb

## 2022-07-29 DIAGNOSIS — M79604 Pain in right leg: Secondary | ICD-10-CM

## 2022-07-29 DIAGNOSIS — E0842 Diabetes mellitus due to underlying condition with diabetic polyneuropathy: Secondary | ICD-10-CM | POA: Diagnosis not present

## 2022-07-29 DIAGNOSIS — M79605 Pain in left leg: Secondary | ICD-10-CM | POA: Diagnosis not present

## 2022-07-29 DIAGNOSIS — I739 Peripheral vascular disease, unspecified: Secondary | ICD-10-CM

## 2022-08-09 ENCOUNTER — Encounter (INDEPENDENT_AMBULATORY_CARE_PROVIDER_SITE_OTHER): Payer: Self-pay | Admitting: Nurse Practitioner

## 2022-08-09 NOTE — Progress Notes (Signed)
Subjective:    Patient ID: Theresa Ferguson, female    DOB: [birth year], 76 y.o.   MRN: [id number] Chief Complaint  Patient presents with   Follow-up    Ultrasound follow up    The patient is seen for evaluation of painful lower extremities. Patient notes her legs always feel cold and kind of hurt.  The feelings are not always associated with activity.  The discomfort is somewhat consistent day to day occurring on most days. The patient notes the discomfort also occurs with standing and routinely seems worse as the day wears on. The pain has been progressive over the past several months. The patient states these symptoms are causing  a negative impact on quality of life and daily activities which was a factor in the referral.  She has also noted a few varicose veins which also seem to be tender and bother her.   The patient has a  history of back problems and DJD of the lumbar and sacral spine as well as documented neuropathy.    The patient denies rest pain or dangling of an extremity off the side of the bed during the night for relief. No open wounds or sores at this time. No history of DVT or phlebitis. No prior vascular interventions or surgeries.   Previously, duplex ultrasound of the venous system bilateral lower extremities shows normal deep system bilaterally with trivial superficial reflux noted on the left no superficial reflux noted on the right  Today the patient has ABI of 1.07 on the right and 0.4 on the left.  The patient has triphasic tibial artery waveforms in the right lower extremity with slightly dampened toe waveforms on the right.  Patient has monophasic/biphasic waveforms on the left with slightly dampened toe waveforms.     Review of Systems     Objective:   Physical Exam  BP (!) 147/86 (BP Location: Left Arm)   Pulse 86   Resp 16   Wt 149 lb 12.8 oz (67.9 kg)   BMI 24.93 kg/m   Past Medical History:  Diagnosis Date   Anemia    Anxiety    Arthritis     Cancer ([place]) 05/2020   right breast [facility]   Complication of anesthesia    CTS (carpal tunnel syndrome)    bil, by EMG/[place]   GERD (gastroesophageal reflux disease)    Hyperlipemia    Hypertension    Neuropathy in diabetes ([place]) 02/02/2013   Personal history of radiation therapy    PONV (postoperative nausea and vomiting)    Radiculopathy of lumbosacral region 02/02/2013   RLS (restless legs syndrome) 02/05/2015   Seasonal allergies     Social History   Socioeconomic History   Marital status: Married    Spouse name: Jeneen Rinks   Number of children: 2   Years of education: [employer] education level: Not on file  Occupational History   Occupation: retired    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   Patient has two adult children.   Patient is retired.   Patient has a college education.   Patient is right-handed.   Patient drinks two cups of coffee daily.   Social Determinants of Health   Financial Resource Strain: Not on file  Food Insecurity: Not on file  Transportation Needs: Not on file  Physical Activity: Not on file  Stress: Not on file  Social Connections: Not on file  Intimate Partner Violence: Not on file    Past Surgical History:  Procedure Laterality Date   BREAST LUMPECTOMY Right 08/07/2020   right lumpectomy w/ radiation   BREAST LUMPECTOMY WITH RADIOACTIVE SEED LOCALIZATION Right 08/07/2020   Procedure: RIGHT BREAST LUMPECTOMY WITH RADIOACTIVE SEED LOCALIZATION;   Surgeon: Rolm Bookbinder, MD;  Location: [facility];  Service: General;  Laterality: Right;   FINGER SURGERY Right 08/31/1996   [facility]   kidney stones  08/31/1978   KNEE SURGERY Bilateral    partial right-2008,left full replacement-2006-Dr [facility]   LAMINECTOMY     l4-5   LUMBAR DISC SURGERY      Family History  Problem Relation Age of Onset   Diabetes Son    Breast cancer Neg Hx     Allergies  Allergen Reactions   Codeine    Latex    Morphine And Related    Nickel    Penicillins    Atorvastatin Other (See Comments)   Cerivastatin Other (See Comments)   Fluvastatin Other (See Comments)       Latest Ref Rng & Units 04/17/2022   10:29 AM 07/15/2021    1:28 PM 04/11/2021    1:58 PM  CBC  WBC 4.0 - 10.5 K/uL 3.6  3.5  3.0   Hemoglobin 12.0 - 15.0 g/dL 10.9  10.6  10.1   Hematocrit 36.0 - 46.0 % 32.1  31.1  31.5   Platelets 150 - 400 K/uL 151  135  149       CMP     Component Value Date/Time   NA 139 12/10/2020 1307   K 4.1 12/10/2020 1307   K 4.0 09/20/2014 1223   CL 105 12/10/2020 1307   CO2 25 12/10/2020 1307   GLUCOSE 115 (H) 12/10/2020 1307   BUN 21 12/10/2020 1307   CREATININE 1.20 (H) 07/22/2021 1428   CALCIUM 9.4 12/10/2020 1307   PROT 7.3 12/10/2020 1307   ALBUMIN 4.5 12/10/2020 1307   AST 20 12/10/2020 1307   ALT 14 12/10/2020 1307   ALKPHOS 39 12/10/2020 1307   BILITOT 0.6 12/10/2020 1307   GFRNONAA 58 (L) 12/10/2020 1307     No results found.     Assessment & Plan:   1. Pain in both lower extremities The patient likely has some component of neuropathy that is causing pain in her legs however she also has noted peripheral arterial disease as noted below. - VAS [place] ABI WITH/WO TBI  2. PAD (peripheral artery disease) ([place]) The patient does have evidence of peripheral arterial disease in her left lower extremity.  We discussed the possibility of angiogram versus conservative therapy.  The patient would like to  discuss with husband and family.  She will contact us if she wishes to move forward with intervention.  If the patient wishes to continue with conservative therapy we can reevaluate her studies in 3 months.  3. Diabetic polyneuropathy associated with diabetes mellitus due to underlying  condition [facility]) This may play a factor in the patient's lower extremity pain   Current Outpatient Medications on File Prior to Visit  Medication Sig Dispense Refill   aspirin 81 MG tablet Take 81 mg by mouth daily.     calcium carbonate (OS-CAL - DOSED IN MG OF ELEMENTAL CALCIUM) 1250 (500 Ca) MG tablet Take 1 tablet by mouth.     escitalopram (LEXAPRO) 20 MG tablet TAKE 1 TABLET BY MOUTH ONCE DAILY 90 tablet 0   ferrous sulfate 325 (65 FE) MG tablet Take 325 mg by mouth daily with breakfast.     gabapentin (NEURONTIN) 800 MG tablet Take one by mouth at morning . Lunch and 2 at night. 360 tablet 3   magnesium oxide (MAG-OX) 400 MG tablet Take 400 mg by mouth daily.     metFORMIN (GLUCOPHAGE) 500 MG tablet Take 1,000 mg by mouth 2 (two) times daily. At bedtime     pantoprazole (PROTONIX) 40 MG tablet Take 40 mg by mouth daily.     rosuvastatin (CRESTOR) 10 MG tablet Take 10 mg by mouth daily.     vitamin B-12 (CYANOCOBALAMIN) 1000 MCG tablet Take 1,000 mcg by mouth daily.     No current facility-administered medications on file prior to visit.    There are no Patient Instructions on file for this visit. No follow-ups on file.   Kris Hartmann, NP

## 2022-08-19 ENCOUNTER — Other Ambulatory Visit: Payer: Self-pay | Admitting: General Surgery

## 2022-08-19 DIAGNOSIS — Z853 Personal history of malignant neoplasm of breast: Secondary | ICD-10-CM

## 2022-09-04 ENCOUNTER — Ambulatory Visit
Admission: RE | Admit: 2022-09-04 | Discharge: 2022-09-04 | Disposition: A | Discharge status: Home / Self Care | Payer: Medicare Other | Source: Ambulatory Visit | Attending: General Surgery | Admitting: General Surgery

## 2022-09-04 DIAGNOSIS — Z853 Personal history of malignant neoplasm of breast: Secondary | ICD-10-CM | POA: Diagnosis not present

## 2022-09-04 DIAGNOSIS — R922 Inconclusive mammogram: Secondary | ICD-10-CM | POA: Diagnosis not present

## 2022-09-16 DIAGNOSIS — Z961 Presence of intraocular lens: Secondary | ICD-10-CM | POA: Diagnosis not present

## 2022-09-16 DIAGNOSIS — H02889 Meibomian gland dysfunction of unspecified eye, unspecified eyelid: Secondary | ICD-10-CM | POA: Diagnosis not present

## 2022-10-02 DIAGNOSIS — E1149 Type 2 diabetes mellitus with other diabetic neurological complication: Secondary | ICD-10-CM | POA: Diagnosis not present

## 2022-10-02 DIAGNOSIS — E782 Mixed hyperlipidemia: Secondary | ICD-10-CM | POA: Diagnosis not present

## 2022-10-02 DIAGNOSIS — K219 Gastro-esophageal reflux disease without esophagitis: Secondary | ICD-10-CM | POA: Diagnosis not present

## 2022-10-02 DIAGNOSIS — E1121 Type 2 diabetes mellitus with diabetic nephropathy: Secondary | ICD-10-CM | POA: Diagnosis not present

## 2022-10-02 DIAGNOSIS — I129 Hypertensive chronic kidney disease with stage 1 through stage 4 chronic kidney disease, or unspecified chronic kidney disease: Secondary | ICD-10-CM | POA: Diagnosis not present

## 2022-10-02 DIAGNOSIS — N1831 Chronic kidney disease, stage 3a: Secondary | ICD-10-CM | POA: Diagnosis not present

## 2022-10-02 DIAGNOSIS — Z7984 Long term (current) use of oral hypoglycemic drugs: Secondary | ICD-10-CM | POA: Diagnosis not present

## 2022-10-02 DIAGNOSIS — F39 Unspecified mood [affective] disorder: Secondary | ICD-10-CM | POA: Diagnosis not present

## 2022-10-14 DIAGNOSIS — L821 Other seborrheic keratosis: Secondary | ICD-10-CM | POA: Diagnosis not present

## 2022-10-14 DIAGNOSIS — D2272 Melanocytic nevi of left lower limb, including hip: Secondary | ICD-10-CM | POA: Diagnosis not present

## 2022-10-14 DIAGNOSIS — X32XXXA Exposure to sunlight, initial encounter: Secondary | ICD-10-CM | POA: Diagnosis not present

## 2022-10-14 DIAGNOSIS — D2262 Melanocytic nevi of left upper limb, including shoulder: Secondary | ICD-10-CM | POA: Diagnosis not present

## 2022-10-14 DIAGNOSIS — D225 Melanocytic nevi of trunk: Secondary | ICD-10-CM | POA: Diagnosis not present

## 2022-10-14 DIAGNOSIS — D2271 Melanocytic nevi of right lower limb, including hip: Secondary | ICD-10-CM | POA: Diagnosis not present

## 2022-10-14 DIAGNOSIS — D2261 Melanocytic nevi of right upper limb, including shoulder: Secondary | ICD-10-CM | POA: Diagnosis not present

## 2022-10-14 DIAGNOSIS — L738 Other specified follicular disorders: Secondary | ICD-10-CM | POA: Diagnosis not present

## 2022-10-14 DIAGNOSIS — C4401 Basal cell carcinoma of skin of lip: Secondary | ICD-10-CM | POA: Diagnosis not present

## 2022-10-14 DIAGNOSIS — D485 Neoplasm of uncertain behavior of skin: Secondary | ICD-10-CM | POA: Diagnosis not present

## 2022-10-14 DIAGNOSIS — L570 Actinic keratosis: Secondary | ICD-10-CM | POA: Diagnosis not present

## 2022-10-16 DIAGNOSIS — H9041 Sensorineural hearing loss, unilateral, right ear, with unrestricted hearing on the contralateral side: Secondary | ICD-10-CM | POA: Diagnosis not present

## 2022-10-16 DIAGNOSIS — H9311 Tinnitus, right ear: Secondary | ICD-10-CM | POA: Diagnosis not present

## 2022-10-16 DIAGNOSIS — D37030 Neoplasm of uncertain behavior of the parotid salivary glands: Secondary | ICD-10-CM | POA: Diagnosis not present

## 2022-10-20 DIAGNOSIS — I129 Hypertensive chronic kidney disease with stage 1 through stage 4 chronic kidney disease, or unspecified chronic kidney disease: Secondary | ICD-10-CM | POA: Diagnosis not present

## 2022-10-20 DIAGNOSIS — N1832 Chronic kidney disease, stage 3b: Secondary | ICD-10-CM | POA: Diagnosis not present

## 2022-10-20 DIAGNOSIS — I739 Peripheral vascular disease, unspecified: Secondary | ICD-10-CM | POA: Diagnosis not present

## 2022-10-20 DIAGNOSIS — N2581 Secondary hyperparathyroidism of renal origin: Secondary | ICD-10-CM | POA: Diagnosis not present

## 2022-10-20 DIAGNOSIS — R809 Proteinuria, unspecified: Secondary | ICD-10-CM | POA: Diagnosis not present

## 2022-10-20 DIAGNOSIS — D631 Anemia in chronic kidney disease: Secondary | ICD-10-CM | POA: Diagnosis not present

## 2022-10-20 DIAGNOSIS — N183 Chronic kidney disease, stage 3 unspecified: Secondary | ICD-10-CM | POA: Diagnosis not present

## 2022-10-29 ENCOUNTER — Other Ambulatory Visit: Payer: Self-pay | Admitting: Nephrology

## 2022-10-29 DIAGNOSIS — N184 Chronic kidney disease, stage 4 (severe): Secondary | ICD-10-CM

## 2022-11-02 ENCOUNTER — Encounter: Payer: Self-pay | Admitting: Oncology

## 2022-11-09 ENCOUNTER — Ambulatory Visit
Admission: RE | Admit: 2022-11-09 | Discharge: 2022-11-09 | Disposition: A | Discharge status: Home / Self Care | Payer: Medicare Other | Source: Ambulatory Visit | Attending: Nephrology | Admitting: Nephrology

## 2022-11-09 ENCOUNTER — Encounter: Payer: Self-pay | Admitting: Oncology

## 2022-11-09 DIAGNOSIS — N189 Chronic kidney disease, unspecified: Secondary | ICD-10-CM | POA: Diagnosis not present

## 2022-11-09 DIAGNOSIS — N184 Chronic kidney disease, stage 4 (severe): Secondary | ICD-10-CM

## 2022-11-19 DIAGNOSIS — L578 Other skin changes due to chronic exposure to nonionizing radiation: Secondary | ICD-10-CM | POA: Diagnosis not present

## 2022-11-19 DIAGNOSIS — L988 Other specified disorders of the skin and subcutaneous tissue: Secondary | ICD-10-CM | POA: Diagnosis not present

## 2022-11-19 DIAGNOSIS — C4401 Basal cell carcinoma of skin of lip: Secondary | ICD-10-CM | POA: Diagnosis not present

## 2022-11-19 DIAGNOSIS — L814 Other melanin hyperpigmentation: Secondary | ICD-10-CM | POA: Diagnosis not present

## 2022-11-30 DIAGNOSIS — N183 Chronic kidney disease, stage 3 unspecified: Secondary | ICD-10-CM | POA: Diagnosis not present

## 2022-12-15 ENCOUNTER — Encounter: Payer: Self-pay | Admitting: Oncology

## 2022-12-20 DIAGNOSIS — I739 Peripheral vascular disease, unspecified: Secondary | ICD-10-CM | POA: Insufficient documentation

## 2022-12-20 NOTE — Progress Notes (Unsigned)
MRN : [id number]  Theresa Ferguson is a 77 y.o. (Oct 31, 1945) female who presents with chief complaint of check circulation.  History of Present Illness:   The patient returns to the office for followup and review of the noninvasive studies.   The patient notes that there has been a significant deterioration in the lower extremity symptoms.  The patient notes interval shortening of their claudication distance and development of mild rest pain symptoms. No new ulcers or wounds have occurred since the last visit.  There have been no significant changes to the patient's overall health care.  The patient denies amaurosis fugax or recent TIA symptoms. There are no recent neurological changes noted. There is no history of DVT, PE or superficial thrombophlebitis. The patient denies recent episodes of angina or shortness of breath.   ABI's Rt=1.07 and Lt=0.84  Duplex US of the bilateral lower extremity venous system shows normal deep venous system.  No superficial reflux on the right.  Trivial superficial reflux at the junction only on the left.  No outpatient medications have been marked as taking for the 12/21/22 encounter (Appointment) with Gilda Crease, Latina Craver, MD.    Past Medical History:  Diagnosis Date   Anemia    Anxiety    Arthritis    Cancer ([facility]) 05/2020   right breast [facility]   Complication of anesthesia    CTS (carpal tunnel syndrome)    bil, by EMG/[place]   GERD (gastroesophageal reflux disease)    Hyperlipemia    Hypertension    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   Personal history of radiation therapy    PONV (postoperative nausea and vomiting)    Radiculopathy of lumbosacral region 02/02/2013   RLS (restless legs syndrome) 02/05/2015   Seasonal allergies     Past Surgical History:  Procedure Laterality Date   BREAST LUMPECTOMY Right 08/07/2020   right lumpectomy w/ radiation   BREAST LUMPECTOMY WITH RADIOACTIVE SEED LOCALIZATION Right 08/07/2020    Procedure: RIGHT BREAST LUMPECTOMY WITH RADIOACTIVE SEED LOCALIZATION;  Surgeon: Emelia Loron, MD;  Location: [facility];  Service: General;  Laterality: Right;   FINGER SURGERY Right 08/31/1996   [facility]   kidney stones  08/31/1978   KNEE SURGERY Bilateral    partial right-2008,left full replacement-2006-Dr [facility]   LAMINECTOMY     l4-5   LUMBAR DISC SURGERY      Social History Social History   Tobacco Use   Smoking status: Former    Types: Cigarettes    Quit date: 1982    Years since quitting: 42.3   Smokeless tobacco: Never  Vaping Use   Vaping Use: Never used  Substance Use Topics   Alcohol use: Yes    Comment: socially   Drug use: No    Family History Family History  Problem Relation Age of Onset   Diabetes Son    Breast cancer Neg Hx     Allergies  Allergen Reactions   Codeine    Latex    Morphine And Related    Nickel    Penicillins    Atorvastatin Other (See Comments)   Cerivastatin Other (See Comments)   Fluvastatin Other (See Comments)     REVIEW OF SYSTEMS (Negative unless checked)  Constitutional: Weight loss  Fever  Chills Cardiac: Chest pain   Chest pressure   Palpitations   Shortness of breath when laying flat   Shortness of breath  with exertion. Vascular:  Pain in legs with walking   Pain in legs at rest  History of DVT   Phlebitis   Swelling in legs   Varicose veins   Non-healing ulcers Pulmonary:   Uses home oxygen   Productive cough   Hemoptysis   Wheeze  COPD   Asthma Neurologic:  Dizziness   Seizures   History of stroke   History of TIA  Aphasia   Vissual changes   Weakness or numbness in arm   Weakness or numbness in leg Musculoskeletal:   Joint swelling   Joint pain   Low back pain Hematologic:  Easy bruising  Easy bleeding   Hypercoagulable state   Anemic Gastrointestinal:  Diarrhea   Vomiting  Gastroesophageal reflux/heartburn    Difficulty swallowing. Genitourinary:  Chronic kidney disease   Difficult urination  Frequent urination   Blood in urine Skin:  Rashes   Ulcers  Psychological:  History of anxiety    History of major depression.  Physical Examination  There were no vitals filed for this visit. There is no height or weight on file to calculate BMI. Gen: WD/WN, NAD Head: [place]/AT, No temporalis wasting.  Ear/Nose/Throat: Hearing grossly intact, nares w/o erythema or drainage Eyes: PER, EOMI, sclera nonicteric.  Neck: Supple, no masses.  No bruit or JVD.  Pulmonary:  Good air movement, no audible wheezing, no use of accessory muscles.  Cardiac: RRR, normal S1, S2, no Murmurs. Vascular:  mild trophic changes, no open wounds Vessel Right Left  Radial Palpable Palpable  PT Not Palpable Not Palpable  DP Not Palpable Not Palpable  Gastrointestinal: soft, non-distended. No guarding/no peritoneal signs.  Musculoskeletal: M/S 5/5 throughout.  No visible deformity.  Neurologic: CN 2-12 intact. Pain and light touch intact in extremities.  Symmetrical.  Speech is fluent. Motor exam as listed above. Psychiatric: Judgment intact, Mood & affect appropriate for pt's clinical situation. Dermatologic: No rashes or ulcers noted.  No changes consistent with cellulitis.   CBC Lab Results  Component Value Date   WBC 3.6 (L) 04/17/2022   HGB 10.9 (L) 04/17/2022   HCT 32.1 (L) 04/17/2022   MCV 97.6 04/17/2022   PLT 151 04/17/2022    BMET    Component Value Date/Time   NA 139 12/10/2020 1307   K 4.1 12/10/2020 1307   K 4.0 09/20/2014 1223   CL 105 12/10/2020 1307   CO2 25 12/10/2020 1307   GLUCOSE 115 (H) 12/10/2020 1307   BUN 21 12/10/2020 1307   CREATININE 1.20 (H) 07/22/2021 1428   CALCIUM 9.4 12/10/2020 1307   GFRNONAA 58 (L) 12/10/2020 1307   CrCl cannot be calculated (Patient's most recent lab result is older than the maximum 21 days allowed.).  COAG No results found for: "INR",  "PROTIME"  Radiology No results found.   Assessment/Plan There are no diagnoses linked to this encounter.   [name], MD  12/20/2022 10:44 AM

## 2022-12-20 NOTE — H&P (View-Only) (Signed)
MRN : [id number]  Theresa Ferguson is a 77 y.o. (1946-02-05) female who presents with chief complaint of check circulation.  History of Present Illness:   The patient returns to the office for followup and review of the noninvasive studies and discussion of treatment options for her atherosclerotic occlusive disease.   The patient notes that there has been a deterioration in the left lower extremity symptoms.  The patient notes shortening of their claudication distance and development of mild rest pain symptoms. No new ulcers or wounds have occurred since the last visit.  At the time of her previous visit she had several other pressing medical issues which she felt had priority over her leg difficulties.  These issues have now been resolved and she is finding daily activities increasingly difficult given the very short distance she is able to walk without pain.  There have been no significant changes to the patient's overall health care.  The patient denies amaurosis fugax or recent TIA symptoms. There are no recent neurological changes noted. There is no history of DVT, PE or superficial thrombophlebitis. The patient denies recent episodes of angina or shortness of breath.   ABI's Rt=1.07 and Lt=0.84  Duplex US of the bilateral lower extremity venous system shows normal deep venous system.  No superficial reflux on the right.  Trivial superficial reflux at the junction only on the left.  No outpatient medications have been marked as taking for the 12/21/22 encounter (Appointment) with Gilda Crease, Latina Craver, MD.    Past Medical History:  Diagnosis Date   Anemia    Anxiety    Arthritis    Cancer ([facility]) 05/2020   right breast [facility]   Complication of anesthesia    CTS (carpal tunnel syndrome)    bil, by EMG/[place]   GERD (gastroesophageal reflux disease)    Hyperlipemia    Hypertension    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   Personal history of radiation therapy     PONV (postoperative nausea and vomiting)    Radiculopathy of lumbosacral region 02/02/2013   RLS (restless legs syndrome) 02/05/2015   Seasonal allergies     Past Surgical History:  Procedure Laterality Date   BREAST LUMPECTOMY Right 08/07/2020   right lumpectomy w/ radiation   BREAST LUMPECTOMY WITH RADIOACTIVE SEED LOCALIZATION Right 08/07/2020   Procedure: RIGHT BREAST LUMPECTOMY WITH RADIOACTIVE SEED LOCALIZATION;  Surgeon: Emelia Loron, MD;  Location: [facility];  Service: General;  Laterality: Right;   FINGER SURGERY Right 08/31/1996   [facility]   kidney stones  08/31/1978   KNEE SURGERY Bilateral    partial right-2008,left full replacement-2006-Dr [facility]   LAMINECTOMY     l4-5   LUMBAR DISC SURGERY      Social History Social History   Tobacco Use   Smoking status: Former    Types: Cigarettes    Quit date: 1982    Years since quitting: 42.3   Smokeless tobacco: Never  Vaping Use   Vaping Use: Never used  Substance Use Topics   Alcohol use: Yes    Comment: socially   Drug use: No    Family History Family History  Problem Relation Age of Onset   Diabetes Son    Breast cancer Neg Hx     Allergies  Allergen Reactions   Codeine    Latex    Morphine And Related    Nickel    Penicillins  Atorvastatin Other (See Comments)   Cerivastatin Other (See Comments)   Fluvastatin Other (See Comments)     REVIEW OF SYSTEMS (Negative unless checked)  Constitutional: [] Weight loss  [] Fever  [] Chills Cardiac: [] Chest pain   [] Chest pressure   [] Palpitations   [] Shortness of breath when laying flat   [] Shortness of breath with exertion. Vascular:  [x] Pain in legs with walking   [] Pain in legs at rest  [] History of DVT   [] Phlebitis   [] Swelling in legs   [] Varicose veins   [] Non-healing ulcers Pulmonary:   [] Uses home oxygen   [] Productive cough   [] Hemoptysis   [] Wheeze  [] COPD   [] Asthma Neurologic:  [] Dizziness   [] Seizures   [] History  of stroke   [] History of TIA  [] Aphasia   [] Vissual changes   [] Weakness or numbness in arm   [] Weakness or numbness in leg Musculoskeletal:   [] Joint swelling   [] Joint pain   [] Low back pain Hematologic:  [] Easy bruising  [] Easy bleeding   [] Hypercoagulable state   [] Anemic Gastrointestinal:  [] Diarrhea   [] Vomiting  [] Gastroesophageal reflux/heartburn   [] Difficulty swallowing. Genitourinary:  [] Chronic kidney disease   [] Difficult urination  [] Frequent urination   [] Blood in urine Skin:  [] Rashes   [] Ulcers  Psychological:  [] History of anxiety   []  History of major depression.  Physical Examination  There were no vitals filed for this visit. There is no height or weight on file to calculate BMI. Gen: WD/WN, NAD Head: [place]/AT, No temporalis wasting.  Ear/Nose/Throat: Hearing grossly intact, nares w/o erythema or drainage Eyes: PER, EOMI, sclera nonicteric.  Neck: Supple, no masses.  No bruit or JVD.  Pulmonary:  Good air movement, no audible wheezing, no use of accessory muscles.  Cardiac: RRR, normal S1, S2, no Murmurs. Vascular:  mild trophic changes, no open wounds Vessel Right Left  Radial Palpable Palpable  PT Palpable Not Palpable  DP Palpable Not Palpable  Gastrointestinal: soft, non-distended. No guarding/no peritoneal signs.  Musculoskeletal: M/S 5/5 throughout.  No visible deformity.  Neurologic: CN 2-12 intact. Pain and light touch intact in extremities.  Symmetrical.  Speech is fluent. Motor exam as listed above. Psychiatric: Judgment intact, Mood & affect appropriate for pt's clinical situation. Dermatologic: No rashes or ulcers noted.  No changes consistent with cellulitis.   CBC Lab Results  Component Value Date   WBC 3.6 (L) 04/17/2022   HGB 10.9 (L) 04/17/2022   HCT 32.1 (L) 04/17/2022   MCV 97.6 04/17/2022   PLT 151 04/17/2022    BMET    Component Value Date/Time   NA 139 12/10/2020 1307   K 4.1 12/10/2020 1307   K 4.0 09/20/2014 1223   CL 105  12/10/2020 1307   CO2 25 12/10/2020 1307   GLUCOSE 115 (H) 12/10/2020 1307   BUN 21 12/10/2020 1307   CREATININE 1.20 (H) 07/22/2021 1428   CALCIUM 9.4 12/10/2020 1307   GFRNONAA 58 (L) 12/10/2020 1307   CrCl cannot be calculated (Patient's most recent lab result is older than the maximum 21 days allowed.).  COAG No results found for: "INR", "PROTIME"  Radiology No results found.   Assessment/Plan 1. PAD (peripheral artery disease) Recommend:  The patient has experienced increased claudication symptoms and is now describing lifestyle limiting claudication and appears to be having mild rest pain symptroms.  Given the severity of the patient's severe left lower extremity symptoms the patient should undergo angiography with the hope for intervention.  Risk and benefits were reviewed the patient.  Indications for the procedure were reviewed.  All questions were answered, the patient agrees to proceed with left lower extremity angiography and possible intervention.   The patient should continue walking and begin a more formal exercise program.  The patient should continue antiplatelet therapy and aggressive treatment of the lipid abnormalities  The patient will follow up with me after the angiogram.   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 Type 2 diabetes mellitus with other specified complication, unspecified whether long term insulin use Continue hypoglycemic medications as already ordered, these medications have been reviewed and there are no changes at this time.  Hgb A1C to be monitored as already arranged by primary service  3. Mixed hyperlipidemia Continue statin as ordered and reviewed, no changes at this time  4. DDD  (degenerative disc disease), lumbar Continue NSAID medications as already ordered, these medications have been reviewed and there are no changes at this time.  Continued activity and therapy was stressed.    [name], MD  12/20/2022 10:44 AM

## 2022-12-21 ENCOUNTER — Ambulatory Visit (INDEPENDENT_AMBULATORY_CARE_PROVIDER_SITE_OTHER): Payer: Medicare Other | Admitting: Vascular Surgery

## 2022-12-21 ENCOUNTER — Encounter (INDEPENDENT_AMBULATORY_CARE_PROVIDER_SITE_OTHER): Payer: Self-pay | Admitting: Vascular Surgery

## 2022-12-21 VITALS — BP 152/88 | HR 76 | Resp 18 | Ht 65.5 in | Wt 150.2 lb

## 2022-12-21 DIAGNOSIS — I872 Venous insufficiency (chronic) (peripheral): Secondary | ICD-10-CM

## 2022-12-21 DIAGNOSIS — M5417 Radiculopathy, lumbosacral region: Secondary | ICD-10-CM

## 2022-12-21 DIAGNOSIS — M5136 Other intervertebral disc degeneration, lumbar region: Secondary | ICD-10-CM

## 2022-12-21 DIAGNOSIS — E1169 Type 2 diabetes mellitus with other specified complication: Secondary | ICD-10-CM | POA: Diagnosis not present

## 2022-12-21 DIAGNOSIS — I739 Peripheral vascular disease, unspecified: Secondary | ICD-10-CM | POA: Diagnosis not present

## 2022-12-21 DIAGNOSIS — E782 Mixed hyperlipidemia: Secondary | ICD-10-CM | POA: Diagnosis not present

## 2022-12-22 ENCOUNTER — Telehealth (INDEPENDENT_AMBULATORY_CARE_PROVIDER_SITE_OTHER): Payer: Self-pay

## 2022-12-22 ENCOUNTER — Encounter (INDEPENDENT_AMBULATORY_CARE_PROVIDER_SITE_OTHER): Payer: Self-pay

## 2022-12-22 NOTE — Telephone Encounter (Signed)
Patient has been schedule for left lower extremity angio with Dr Gilda Crease on 12/29/2022 [facility]. Pre-procedure instructions were gone over with patient and letter will be mailed out today.

## 2022-12-29 ENCOUNTER — Other Ambulatory Visit: Payer: Self-pay

## 2022-12-29 ENCOUNTER — Ambulatory Visit: Payer: Medicare Other | Admitting: Anesthesiology

## 2022-12-29 ENCOUNTER — Ambulatory Visit
Admission: RE | Admit: 2022-12-29 | Discharge: 2022-12-29 | Disposition: A | Discharge status: Home / Self Care | Payer: Medicare Other | Attending: Vascular Surgery | Admitting: Vascular Surgery

## 2022-12-29 ENCOUNTER — Encounter
Admission: RE | Disposition: A | Discharge status: Home / Self Care | Payer: Self-pay | Source: Home / Self Care | Attending: Vascular Surgery

## 2022-12-29 ENCOUNTER — Encounter: Payer: Self-pay | Admitting: Vascular Surgery

## 2022-12-29 DIAGNOSIS — I700 Atherosclerosis of aorta: Secondary | ICD-10-CM | POA: Diagnosis not present

## 2022-12-29 DIAGNOSIS — E1151 Type 2 diabetes mellitus with diabetic peripheral angiopathy without gangrene: Secondary | ICD-10-CM | POA: Insufficient documentation

## 2022-12-29 DIAGNOSIS — M5136 Other intervertebral disc degeneration, lumbar region: Secondary | ICD-10-CM | POA: Diagnosis not present

## 2022-12-29 DIAGNOSIS — I70222 Atherosclerosis of native arteries of extremities with rest pain, left leg: Secondary | ICD-10-CM | POA: Insufficient documentation

## 2022-12-29 DIAGNOSIS — I739 Peripheral vascular disease, unspecified: Secondary | ICD-10-CM

## 2022-12-29 DIAGNOSIS — E782 Mixed hyperlipidemia: Secondary | ICD-10-CM | POA: Insufficient documentation

## 2022-12-29 HISTORY — PX: LOWER EXTREMITY ANGIOGRAPHY: CATH118251

## 2022-12-29 LAB — GLUCOSE, CAPILLARY: Glucose-Capillary: 110 mg/dL — ABNORMAL HIGH (ref 70–99)

## 2022-12-29 LAB — BUN: BUN: 27 mg/dL — ABNORMAL HIGH (ref 8–23)

## 2022-12-29 LAB — CREATININE, SERUM
Creatinine, Ser: 1.23 mg/dL — ABNORMAL HIGH (ref 0.44–1.00)
GFR, Estimated: 46 mL/min — ABNORMAL LOW (ref >60)

## 2022-12-29 SURGERY — LOWER EXTREMITY ANGIOGRAPHY
Anesthesia: Moderate Sedation | Site: Leg Lower | Laterality: Left

## 2022-12-29 MED ORDER — SODIUM CHLORIDE 0.9 % IV SOLN
INTRAVENOUS | Status: DC | PRN
  Administered 2022-12-29: 100 mL/h via INTRAVENOUS

## 2022-12-29 MED ORDER — DIAZEPAM 5 MG PO TABS
5.0000 mg | ORAL_TABLET | Freq: Four times a day (QID) | ORAL | Status: DC | PRN
  Administered 2022-12-29: 5 mg via ORAL
  Filled 2022-12-29: qty 1

## 2022-12-29 MED ORDER — HYDROMORPHONE HCL 1 MG/ML IJ SOLN
1.0000 mg | Freq: Once | INTRAMUSCULAR | Status: AC | PRN
  Administered 2022-12-29 (×2): 0.5 mg via INTRAVENOUS

## 2022-12-29 MED ORDER — CLOPIDOGREL BISULFATE 300 MG PO TABS
300.0000 mg | ORAL_TABLET | ORAL | Status: AC
  Administered 2022-12-29: 300 mg via ORAL

## 2022-12-29 MED ORDER — FENTANYL CITRATE (PF) 100 MCG/2ML IJ SOLN
INTRAMUSCULAR | Status: AC
  Filled 2022-12-29: qty 2

## 2022-12-29 MED ORDER — HYDROMORPHONE HCL 1 MG/ML IJ SOLN
INTRAMUSCULAR | Status: AC
  Filled 2022-12-29: qty 1

## 2022-12-29 MED ORDER — MIDAZOLAM HCL 5 MG/5ML IJ SOLN
INTRAMUSCULAR | Status: AC
  Filled 2022-12-29: qty 5

## 2022-12-29 MED ORDER — VANCOMYCIN HCL IN DEXTROSE 1-5 GM/200ML-% IV SOLN
1000.0000 mg | INTRAVENOUS | Status: AC
  Administered 2022-12-29: 1000 mg via INTRAVENOUS

## 2022-12-29 MED ORDER — OXYCODONE HCL 5 MG PO TABS: 5.0000 mg | ORAL_TABLET | ORAL | Status: DC | PRN

## 2022-12-29 MED ORDER — HYDRALAZINE HCL 20 MG/ML IJ SOLN: 5.0000 mg | INTRAMUSCULAR | Status: DC | PRN

## 2022-12-29 MED ORDER — SODIUM CHLORIDE 0.9 % IV SOLN: 250.0000 mL | INTRAVENOUS | Status: DC | PRN

## 2022-12-29 MED ORDER — SODIUM CHLORIDE 0.9 % IV SOLN: INTRAVENOUS | Status: DC

## 2022-12-29 MED ORDER — CLOPIDOGREL BISULFATE 75 MG PO TABS
ORAL_TABLET | ORAL | Status: AC
  Filled 2022-12-29: qty 4

## 2022-12-29 MED ORDER — LABETALOL HCL 5 MG/ML IV SOLN: 10.0000 mg | INTRAVENOUS | Status: DC | PRN

## 2022-12-29 MED ORDER — MIDAZOLAM HCL 2 MG/ML PO SYRP: 8.0000 mg | ORAL_SOLUTION | Freq: Once | ORAL | Status: DC | PRN

## 2022-12-29 MED ORDER — VANCOMYCIN HCL IN DEXTROSE 1-5 GM/200ML-% IV SOLN
INTRAVENOUS | Status: AC
  Filled 2022-12-29: qty 200

## 2022-12-29 MED ORDER — ONDANSETRON HCL 4 MG/2ML IJ SOLN: 4.0000 mg | Freq: Four times a day (QID) | INTRAMUSCULAR | Status: DC | PRN

## 2022-12-29 MED ORDER — HEPARIN SODIUM (PORCINE) 1000 UNIT/ML IJ SOLN
INTRAMUSCULAR | Status: AC
  Filled 2022-12-29: qty 10

## 2022-12-29 MED ORDER — NITROGLYCERIN 1 MG/10 ML FOR IR/CATH LAB
INTRA_ARTERIAL | Status: DC | PRN
  Administered 2022-12-29: 300 ug via INTRA_ARTERIAL

## 2022-12-29 MED ORDER — SODIUM CHLORIDE 0.9% FLUSH: 3.0000 mL | Freq: Two times a day (BID) | INTRAVENOUS | Status: DC

## 2022-12-29 MED ORDER — CLOPIDOGREL BISULFATE 75 MG PO TABS: 75.0000 mg | ORAL_TABLET | Freq: Every day | ORAL | 11 refills | Status: DC

## 2022-12-29 MED ORDER — FAMOTIDINE 20 MG PO TABS: 40.0000 mg | ORAL_TABLET | Freq: Once | ORAL | Status: DC | PRN

## 2022-12-29 MED ORDER — DIPHENHYDRAMINE HCL 50 MG/ML IJ SOLN: 50.0000 mg | Freq: Once | INTRAMUSCULAR | Status: DC | PRN

## 2022-12-29 MED ORDER — ONDANSETRON 4 MG PO TBDP
ORAL_TABLET | ORAL | Status: AC
  Filled 2022-12-29: qty 1

## 2022-12-29 MED ORDER — MIDAZOLAM HCL 2 MG/2ML IJ SOLN
INTRAMUSCULAR | Status: DC | PRN
  Administered 2022-12-29: 1 mg via INTRAVENOUS
  Administered 2022-12-29: .5 mg via INTRAVENOUS

## 2022-12-29 MED ORDER — ONDANSETRON 4 MG PO TBDP
4.0000 mg | ORAL_TABLET | Freq: Once | ORAL | Status: AC
  Administered 2022-12-29: 4 mg via ORAL

## 2022-12-29 MED ORDER — FENTANYL CITRATE (PF) 100 MCG/2ML IJ SOLN
INTRAMUSCULAR | Status: DC | PRN
  Administered 2022-12-29: 25 ug via INTRAVENOUS
  Administered 2022-12-29: 50 ug via INTRAVENOUS

## 2022-12-29 MED ORDER — HEPARIN SODIUM (PORCINE) 1000 UNIT/ML IJ SOLN
INTRAMUSCULAR | Status: DC | PRN
  Administered 2022-12-29: 6000 [IU] via INTRAVENOUS

## 2022-12-29 MED ORDER — SODIUM CHLORIDE 0.9% FLUSH: 3.0000 mL | INTRAVENOUS | Status: DC | PRN

## 2022-12-29 MED ORDER — HYDROMORPHONE HCL 1 MG/ML IJ SOLN: 0.5000 mg | INTRAMUSCULAR | Status: DC | PRN

## 2022-12-29 MED ORDER — METHYLPREDNISOLONE SODIUM SUCC 125 MG IJ SOLR: 125.0000 mg | Freq: Once | INTRAMUSCULAR | Status: DC | PRN

## 2022-12-29 MED ORDER — ACETAMINOPHEN 325 MG PO TABS: 650.0000 mg | ORAL_TABLET | ORAL | Status: DC | PRN

## 2022-12-29 SURGICAL SUPPLY — 32 items
BALLN LUTONIX DCB 4X60X130 (BALLOONS) ×1
BALLN LUTONIX DCB 5X100X130 (BALLOONS) ×1
BALLN ULTRASCORE 014 2X100X150 (BALLOONS) ×1
BALLN ULTRASCORE 014 3X40X150 (BALLOONS) ×1
BALLN ULTRVRSE 2X100X150 (BALLOONS) ×1
BALLON DORADO 5X100X135 (BALLOONS) ×1
BALLOON DORADO 5X100X135 (BALLOONS) IMPLANT
BALLOON LUTONIX DCB 4X60X130 (BALLOONS) IMPLANT
BALLOON LUTONIX DCB 5X100X130 (BALLOONS) IMPLANT
BALLOON ULTRSCRE 014 2X100X150 (BALLOONS) IMPLANT
BALLOON ULTRSCRE 014 3X40X150 (BALLOONS) IMPLANT
BALLOON ULTRVRSE 2X100X150 (BALLOONS) IMPLANT
CATH ANGIO 5F PIGTAIL 65CM (CATHETERS) IMPLANT
CATH VERT 5FR 125CM (CATHETERS) IMPLANT
COVER PROBE ULTRASOUND 5X96 (MISCELLANEOUS) IMPLANT
DEVICE STARCLOSE SE CLOSURE (Vascular Products) IMPLANT
GLIDEWIRE ADV .035X260CM (WIRE) IMPLANT
GOWN STRL REUS W/ TWL LRG LVL3 (GOWN DISPOSABLE) ×1 IMPLANT
GOWN STRL REUS W/TWL LRG LVL3 (GOWN DISPOSABLE) ×1
KIT ENCORE 26 ADVANTAGE (KITS) IMPLANT
NDL ENTRY 21GA 7CM ECHOTIP (NEEDLE) IMPLANT
NEEDLE ENTRY 21GA 7CM ECHOTIP (NEEDLE) ×1 IMPLANT
PACK ANGIOGRAPHY (CUSTOM PROCEDURE TRAY) ×1 IMPLANT
SET INTRO CAPELLA COAXIAL (SET/KITS/TRAYS/PACK) IMPLANT
SHEATH BRITE TIP 5FRX11 (SHEATH) IMPLANT
SHEATH NEURON MAX 6FR 80CM (SHEATH) IMPLANT
STENT LIFESTENT 5F 6X40X135 (Permanent Stent) IMPLANT
STENT LIFESTENT 5F 6X60X135 (Permanent Stent) IMPLANT
SYR MEDRAD MARK 7 150ML (SYRINGE) IMPLANT
TUBING CONTRAST HIGH PRESS 72 (TUBING) IMPLANT
WIRE GUIDERIGHT .035X150 (WIRE) IMPLANT
WIRE RUNTHROUGH .014X300CM (WIRE) IMPLANT

## 2022-12-29 NOTE — Op Note (Signed)
[facility]  Percutaneous Study/Intervention Procedural Note   Date of Surgery: 12/29/2022  Surgeon:  [name]  Pre-operative Diagnosis: Atherosclerotic occlusive disease bilateral lower extremities with left lower extremity rest pain.  Post-operative diagnosis:  Same  Procedure(s) Performed:             1.  Introduction catheter into left lower extremity 3rd order catheter placement               2.    Contrast injection left lower extremity for distal runoff             3.  Percutaneous transluminal angioplasty and stent left popliteal artery to 5 mm             4.  Percutaneous transluminal angioplasty anterior tibial artery to 3 mm             5.  Star close closure right common femoral arteriotomy  Anesthesia: Conscious sedation was administered under my direct supervision by the interventional radiology RN. IV Versed plus fentanyl were utilized. Continuous ECG, pulse oximetry and blood pressure was monitored throughout the entire procedure.  Conscious sedation was for a total of 80 minutes.  Sheath: 6 French 80 cm neuron sheath  Contrast: 65 cc  Fluoroscopy Time: 11.9 minutes  Indications:  Theresa Ferguson presents with increasing pain of the left lower extremity.  Physical examination as well as noninvasive studies demonstrate significant atherosclerotic occlusive disease this suggests the patient is having potential limb threatening ischemia. The risks and benefits are reviewed all questions answered patient agrees to proceed.  Procedure:   Theresa Ferguson is a 77 y.o. y.o. female who was identified and appropriate procedural time out was performed.  The patient was then placed supine on the table and prepped and draped in the usual sterile fashion.    Ultrasound was placed in the sterile sleeve and the right groin was evaluated the right common femoral artery was echolucent and pulsatile indicating patency.  Image was recorded for the permanent  record and under real-time visualization a microneedle was inserted into the common femoral artery followed by the microwire and then the micro-sheath.  A J-wire was then advanced through the micro-sheath and a  5 [place] sheath was then inserted over a J-wire. J-wire was then advanced and a 5 French pigtail catheter was positioned at the level of T12.  AP projection of the aorta was then obtained. Pigtail catheter was repositioned to above the bifurcation and a RAO view of the pelvis was obtained.  Subsequently a pigtail catheter with an Advantage wire was used to cross the aortic bifurcation.  The catheter and wire were advanced down into the left distal external iliac artery. Oblique view of the femoral bifurcation was then obtained and subsequently the wire was reintroduced and the pigtail catheter negotiated into the SFA representing third order catheter placement. Distal runoff was then performed.  6000 units of heparin was then given and allowed to circulate for several minutes.  A 6 French neuron sheath was advanced up and over the bifurcation and positioned in the femoral artery  KMP catheter and advantage Glidewire were then negotiated through the popliteal occlusion down into the distal popliteal. Hand injection contrast demonstrated intraluminal positioning and the tibial anatomy in further detail.  The patient was then placed into a frog-leg position with her left ankle crossing her right shin and an oblique image was obtained so the knee prosthesis was out of the way  and the entire popliteal artery could be imaged.  Hand-injection of contrast through the sheath was then performed.  A 4 mm x 60 mm Lutonix drug-eluting balloon was used to angioplasty the popliteal artery occlusion.  The inflation was for 1 minute at 10 atm. Follow-up imaging demonstrated patency with greater than 70% residual stenosis.  Therefore a 6 mm x 60 mm life stent was deployed.  This was not quite long enough and a 6 mm x 40  mm life stent was added more proximally.  They were then postdilated with a 5 mm x 100 mm Lutonix drug-eluting balloon.  Inflation was to 10 atm for 1 minute.  The segment through the actual occlusion remained somewhat small and a 5 mm x 100 mm [place] was advanced over the wire and inflated to 14 atm for 1 minute.  Follow-up imaging demonstrated 25% residual stenosis which given the eccentric dense calcifications I felt was acceptable.  And the patient's leg was straightened and the detective returned to an AP projection.  The detector was then positioned distally.  Kumpe catheter and advantage wire were then negotiated into the anterior tibial magnified imaging of the distal anterior tibial was performed through the Kumpe catheter and a 0.014 run-through wire was advanced all the way down into the dorsalis pedis artery without difficulty.  A 2 mm x 100 mm Ultraverse balloon was advanced into the mid to distal anterior tibial.  Inflation was to 10 atmospheres for 1 minute.  Next a 2 mm x 100 mm ultra score balloon was advanced across this lesion inflated to 8 atm for 1 full minute.  A 3 mm x 40 mm ultra score balloon was then advanced across the origin of the anterior tibial and inflated to 10 atm for 1 minute.  Follow-up imaging demonstrated patency of the anterior tibial with less than 10% residual stenosis. Distal runoff was then reassessed and noted to be widely patent.    After review of these images the sheath is pulled into the right external iliac oblique of the common femoral is obtained and a Star close device deployed. There no immediate Complications.  Findings:  The abdominal aorta is opacified with a bolus injection contrast. Renal arteries are single and widely patent without evidence of hemodynamically significant stenosis.  The aorta itself has diffuse disease but no hemodynamically significant lesions. The common and external iliac arteries are widely patent bilaterally.  The left common  femoral is widely patent as is the profunda femoris.  The SFA does indeed have diffuse disease but there are no hemodynamically significant stenoses.  The mid popliteal artery is occluded over a distance of 40 to 60 mm in length.  The distal popliteal demonstrates moderate nonflow limiting disease and the trifurcation is diseased with a greater than 70% stenosis of the proximal anterior tibial with a subtotal occlusion in the mid to distal portion of the anterior tibial that extends over approximately 100 mm in length.  Tibioperoneal trunk is patent as is the peroneal although the peroneal collateralizes to the foot poorly.  Posterior tibial occludes shortly after its origin.    Following angioplasty anterior tibial now is in-line flow and looks quite nice with less than 10% residual stenosis. Angioplasty and stent placement of the popliteal artery behind the knee replacement prosthesis yields an excellent result with less than 10% residual stenosis.  Summary: Successful recanalization left lower extremity for limb salvage  Disposition: Patient was taken to the recovery room in stable condition having tolerated the procedure well.  Theresa Ferguson 12/29/2022,11:37 AM

## 2022-12-29 NOTE — Interval H&P Note (Signed)
History and Physical Interval Note:  12/29/2022 9:42 AM  Theresa Ferguson  has presented today for surgery, with the diagnosis of LLE Angio   BARD    PAD.  The various methods of treatment have been discussed with the patient and family. After consideration of risks, benefits and other options for treatment, the patient has consented to  Procedure(s): Lower Extremity Angiography (Left) as a surgical intervention.  The patient's history has been reviewed, patient examined, no change in status, stable for surgery.  I have reviewed the patient's chart and labs.  Questions were answered to the patient's satisfaction.     [name]

## 2022-12-30 ENCOUNTER — Encounter: Payer: Self-pay | Admitting: Vascular Surgery

## 2023-01-15 ENCOUNTER — Other Ambulatory Visit (INDEPENDENT_AMBULATORY_CARE_PROVIDER_SITE_OTHER): Payer: Self-pay | Admitting: Vascular Surgery

## 2023-01-15 DIAGNOSIS — I739 Peripheral vascular disease, unspecified: Secondary | ICD-10-CM

## 2023-01-20 ENCOUNTER — Ambulatory Visit (INDEPENDENT_AMBULATORY_CARE_PROVIDER_SITE_OTHER): Payer: Medicare Other

## 2023-01-20 ENCOUNTER — Ambulatory Visit (INDEPENDENT_AMBULATORY_CARE_PROVIDER_SITE_OTHER): Payer: Medicare Other | Admitting: Nurse Practitioner

## 2023-01-20 ENCOUNTER — Encounter (INDEPENDENT_AMBULATORY_CARE_PROVIDER_SITE_OTHER): Payer: Self-pay | Admitting: Nurse Practitioner

## 2023-01-20 VITALS — BP 172/84 | HR 76 | Resp 18 | Ht 65.5 in | Wt 150.8 lb

## 2023-01-20 DIAGNOSIS — E1169 Type 2 diabetes mellitus with other specified complication: Secondary | ICD-10-CM | POA: Diagnosis not present

## 2023-01-20 DIAGNOSIS — Z9889 Other specified postprocedural states: Secondary | ICD-10-CM

## 2023-01-20 DIAGNOSIS — E782 Mixed hyperlipidemia: Secondary | ICD-10-CM

## 2023-01-20 DIAGNOSIS — I739 Peripheral vascular disease, unspecified: Secondary | ICD-10-CM | POA: Diagnosis not present

## 2023-01-21 ENCOUNTER — Encounter (INDEPENDENT_AMBULATORY_CARE_PROVIDER_SITE_OTHER): Payer: Self-pay | Admitting: Nurse Practitioner

## 2023-01-21 LAB — VAS US ABI WITH/WO TBI
Left ABI: 1.04
Right ABI: 1.01

## 2023-01-21 NOTE — Progress Notes (Signed)
Subjective:    Patient ID: Theresa Ferguson, female    DOB: 1946/08/08, 77 y.o.   MRN: [id number] Chief Complaint  Patient presents with   Follow-up    3 week follow up with AB    The patient returns to the office for followup and review status post angiogram with intervention on 12/29/2022.   Procedure: Procedure(s) Performed:             1.  Introduction catheter into left lower extremity 3rd order catheter placement               2.    Contrast injection left lower extremity for distal runoff             3.  Percutaneous transluminal angioplasty and stent left popliteal artery to 5 mm             4.  Percutaneous transluminal angioplasty anterior tibial artery to 3 mm             5.  Star close closure right common femoral arteriotomy   The patient notes improvement in the lower extremity symptoms. No interval shortening of the patient's claudication distance or rest pain symptoms. No new ulcers or wounds have occurred since the last visit.  She did have some significant bruising but is nearly resolved at this time postintervention.  There have been no significant changes to the patient's overall health care.  No documented history of amaurosis fugax or recent TIA symptoms. There are no recent neurological changes noted. No documented history of DVT, PE or superficial thrombophlebitis. The patient denies recent episodes of angina or shortness of breath.   ABI's Rt=1.01 and Lt=1.04  (previous ABI's Rt=1.07 and Lt=0.84) Duplex US of the bilateral lower extremities reveals triphasic tibial artery waveforms.    Review of Systems  Hematological:  Bruises/bleeds easily.  All other systems reviewed and are negative.      Objective:   Physical Exam Vitals reviewed.  HENT:     Head: Normocephalic.  Cardiovascular:     Rate and Rhythm: Normal rate.     Pulses:          Dorsalis pedis pulses are detected w/ Doppler on the right side and detected w/ Doppler on the left side.        Posterior tibial pulses are detected w/ Doppler on the right side and detected w/ Doppler on the left side.  Pulmonary:     Effort: Pulmonary effort is normal.  Skin:    General: Skin is warm and dry.  Neurological:     Mental Status: She is alert and oriented to person, place, and time.  Psychiatric:        Mood and Affect: Mood normal.        Behavior: Behavior normal.        Thought Content: Thought content normal.        Judgment: Judgment normal.     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   Patient has two  adult children.   Patient is retired.   Patient has a college education.   Patient is right-handed.   Patient drinks two cups of coffee daily.   Social Determinants of Health   Financial Resource Strain: Not on file  Food Insecurity: Not on file  Transportation Needs: Not on file  Physical Activity: Not on file  Stress: Not on file  Social Connections: Not on file  Intimate Partner Violence: Not on file    Past Surgical History:  Procedure Laterality Date   BREAST LUMPECTOMY Right 08/07/2020   right lumpectomy w/ radiation   BREAST LUMPECTOMY WITH RADIOACTIVE SEED LOCALIZATION Right 08/07/2020   Procedure: RIGHT BREAST LUMPECTOMY WITH RADIOACTIVE SEED LOCALIZATION;  Surgeon: Emelia Loron, MD;  Location: [facility];  Service: General;  Laterality: Right;   FINGER SURGERY Right 08/31/1996   [facility]   kidney stones  08/31/1978   KNEE SURGERY Bilateral    partial right-2008,left full replacement-2006-Dr [facility]   LAMINECTOMY     l4-5   LOWER EXTREMITY ANGIOGRAPHY Left 12/29/2022   Procedure: Lower Extremity Angiography;  Surgeon: Renford Dills, MD;  Location: [facility];  Service: Cardiovascular;  Laterality: Left;   LUMBAR DISC SURGERY      Family History  Problem Relation Age of Onset   Diabetes Son    Breast cancer Neg Hx     Allergies  Allergen Reactions   Codeine    Latex    Morphine And Codeine    Nickel    Penicillins    Atorvastatin Other (See Comments)   Cerivastatin Other (See Comments)   Fluvastatin Other (See Comments)       Latest Ref Rng & Units 04/17/2022   10:29 AM 07/15/2021    1:28 PM 04/11/2021    1:58 PM  CBC  WBC 4.0 - 10.5 K/uL 3.6  3.5  3.0   Hemoglobin 12.0 - 15.0 g/dL 16.1  09.6  04.5   Hematocrit 36.0 - 46.0 % 32.1  31.1  31.5   Platelets 150 - 400 K/uL 151  135  149       CMP     Component Value Date/Time   NA 139 12/10/2020 1307   K 4.1 12/10/2020 1307   K  4.0 09/20/2014 1223   CL  105 12/10/2020 1307   CO2 25 12/10/2020 1307   GLUCOSE 115 (H) 12/10/2020 1307   BUN 27 (H) 12/29/2022 0929   CREATININE 1.23 (H) 12/29/2022 0929   CALCIUM 9.4 12/10/2020 1307   PROT 7.3 12/10/2020 1307   ALBUMIN 4.5 12/10/2020 1307   AST 20 12/10/2020 1307   ALT 14 12/10/2020 1307   ALKPHOS 39 12/10/2020 1307   BILITOT 0.6 12/10/2020 1307   GFRNONAA 46 (L) 12/29/2022 0929     No results found.     Assessment & Plan:   1. PAD (peripheral artery disease) (HCC) Recommend:  The patient is status post successful angiogram with intervention.  The patient reports that the claudication symptoms and leg pain has improved.   The patient denies lifestyle limiting changes at this point in time.  No further invasive studies, angiography or surgery at this time The patient should continue walking and begin a more formal exercise program.  The patient should continue antiplatelet therapy and aggressive treatment of the lipid abnormalities  Continued surveillance is indicated as atherosclerosis is likely to progress with time.    Patient should undergo noninvasive studies as ordered. The patient will follow up with me to review the studies.   2. Type 2 diabetes mellitus with other specified complication, unspecified whether long term insulin use (HCC) Continue hypoglycemic medications as already ordered, these medications have been reviewed and there are no changes at this time.  Hgb A1C to be monitored as already arranged by primary service  3. Mixed hyperlipidemia Continue statin as ordered and reviewed, no changes at this time   Current Outpatient Medications on File Prior to Visit  Medication Sig Dispense Refill   aspirin 81 MG tablet Take 81 mg by mouth daily.     clopidogrel (PLAVIX) 75 MG tablet Take 1 tablet (75 mg total) by mouth daily. 30 tablet 11   escitalopram (LEXAPRO) 20 MG tablet TAKE 1 TABLET BY MOUTH ONCE DAILY 90 tablet 0   FARXIGA 10 MG TABS tablet Take 10 mg by  mouth daily.     ferrous sulfate 325 (65 FE) MG tablet Take 325 mg by mouth daily with breakfast.     fluticasone (FLONASE ALLERGY RELIEF) 50 MCG/ACT nasal spray Place 2 sprays into both nostrils daily.     gabapentin (NEURONTIN) 800 MG tablet Take one by mouth at morning . Lunch and 2 at night. 360 tablet 3   Magnesium 400 MG TABS 1 tablet Orally once a day     magnesium oxide (MAG-OX) 400 MG tablet Take 400 mg by mouth daily.     metFORMIN (GLUCOPHAGE) 500 MG tablet Take 1,000 mg by mouth 2 (two) times daily. At bedtime     pantoprazole (PROTONIX) 40 MG tablet Take 40 mg by mouth daily.     rosuvastatin (CRESTOR) 10 MG tablet Take 10 mg by mouth daily.     rosuvastatin (CRESTOR) 20 MG tablet Take 20 mg by mouth daily.     vitamin B-12 (CYANOCOBALAMIN) 1000 MCG tablet Take 1,000 mcg by mouth daily.     No current facility-administered medications on file prior to visit.    There are no Patient Instructions on file for this visit. No follow-ups on file.   Georgiana Spinner, NP

## 2023-03-01 ENCOUNTER — Ambulatory Visit: Payer: Medicare Other | Admitting: Family Medicine

## 2023-04-06 DIAGNOSIS — I129 Hypertensive chronic kidney disease with stage 1 through stage 4 chronic kidney disease, or unspecified chronic kidney disease: Secondary | ICD-10-CM | POA: Diagnosis not present

## 2023-04-06 DIAGNOSIS — C50919 Malignant neoplasm of unspecified site of unspecified female breast: Secondary | ICD-10-CM | POA: Diagnosis not present

## 2023-04-06 DIAGNOSIS — I739 Peripheral vascular disease, unspecified: Secondary | ICD-10-CM | POA: Diagnosis not present

## 2023-04-06 DIAGNOSIS — E1149 Type 2 diabetes mellitus with other diabetic neurological complication: Secondary | ICD-10-CM | POA: Diagnosis not present

## 2023-04-06 DIAGNOSIS — K219 Gastro-esophageal reflux disease without esophagitis: Secondary | ICD-10-CM | POA: Diagnosis not present

## 2023-04-06 DIAGNOSIS — E782 Mixed hyperlipidemia: Secondary | ICD-10-CM | POA: Diagnosis not present

## 2023-04-06 DIAGNOSIS — E1121 Type 2 diabetes mellitus with diabetic nephropathy: Secondary | ICD-10-CM | POA: Diagnosis not present

## 2023-04-06 DIAGNOSIS — C4490 Unspecified malignant neoplasm of skin, unspecified: Secondary | ICD-10-CM | POA: Diagnosis not present

## 2023-04-06 DIAGNOSIS — M5136 Other intervertebral disc degeneration, lumbar region: Secondary | ICD-10-CM | POA: Diagnosis not present

## 2023-04-06 DIAGNOSIS — F39 Unspecified mood [affective] disorder: Secondary | ICD-10-CM | POA: Diagnosis not present

## 2023-04-06 DIAGNOSIS — Z0000 Encounter for general adult medical examination without abnormal findings: Secondary | ICD-10-CM | POA: Diagnosis not present

## 2023-04-06 DIAGNOSIS — N1831 Chronic kidney disease, stage 3a: Secondary | ICD-10-CM | POA: Diagnosis not present

## 2023-04-13 DIAGNOSIS — Z86010 Personal history of colonic polyps: Secondary | ICD-10-CM | POA: Diagnosis not present

## 2023-04-13 DIAGNOSIS — K5900 Constipation, unspecified: Secondary | ICD-10-CM | POA: Diagnosis not present

## 2023-04-13 DIAGNOSIS — Z1211 Encounter for screening for malignant neoplasm of colon: Secondary | ICD-10-CM | POA: Diagnosis not present

## 2023-04-13 DIAGNOSIS — I10 Essential (primary) hypertension: Secondary | ICD-10-CM | POA: Diagnosis not present

## 2023-04-13 DIAGNOSIS — E782 Mixed hyperlipidemia: Secondary | ICD-10-CM | POA: Diagnosis not present

## 2023-04-19 ENCOUNTER — Inpatient Hospital Stay (HOSPITAL_BASED_OUTPATIENT_CLINIC_OR_DEPARTMENT_OTHER): Payer: Medicare Other | Admitting: Oncology

## 2023-04-19 ENCOUNTER — Encounter: Payer: Self-pay | Admitting: Oncology

## 2023-04-19 ENCOUNTER — Inpatient Hospital Stay: Payer: Medicare Other | Attending: Oncology

## 2023-04-19 VITALS — BP 135/79 | HR 76 | Temp 95.6°F | Resp 18 | Ht 65.5 in | Wt 147.1 lb

## 2023-04-19 DIAGNOSIS — D649 Anemia, unspecified: Secondary | ICD-10-CM

## 2023-04-19 DIAGNOSIS — Z08 Encounter for follow-up examination after completed treatment for malignant neoplasm: Secondary | ICD-10-CM | POA: Diagnosis not present

## 2023-04-19 DIAGNOSIS — C50411 Malignant neoplasm of upper-outer quadrant of right female breast: Secondary | ICD-10-CM | POA: Insufficient documentation

## 2023-04-19 DIAGNOSIS — Z853 Personal history of malignant neoplasm of breast: Secondary | ICD-10-CM | POA: Diagnosis not present

## 2023-04-19 DIAGNOSIS — Z170 Estrogen receptor positive status [ER+]: Secondary | ICD-10-CM | POA: Insufficient documentation

## 2023-04-19 LAB — CBC WITH DIFFERENTIAL/PLATELET
Abs Immature Granulocytes: 0.01 10*3/uL (ref 0.00–0.07)
Basophils Absolute: 0 10*3/uL (ref 0.0–0.1)
Basophils Relative: 0 %
Eosinophils Absolute: 0.1 10*3/uL (ref 0.0–0.5)
Eosinophils Relative: 2 %
HCT: 32.1 % — ABNORMAL LOW (ref 36.0–46.0)
Hemoglobin: 10.3 g/dL — ABNORMAL LOW (ref 12.0–15.0)
Immature Granulocytes: 0 %
Lymphocytes Relative: 26 %
Lymphs Abs: 0.9 10*3/uL (ref 0.7–4.0)
MCH: 30.7 pg (ref 26.0–34.0)
MCHC: 32.1 g/dL (ref 30.0–36.0)
MCV: 95.5 fL (ref 80.0–100.0)
Monocytes Absolute: 0.3 10*3/uL (ref 0.1–1.0)
Monocytes Relative: 9 %
Neutro Abs: 2.2 10*3/uL (ref 1.7–7.7)
Neutrophils Relative %: 63 %
Platelets: 151 10*3/uL (ref 150–400)
RBC: 3.36 MIL/uL — ABNORMAL LOW (ref 3.87–5.11)
RDW: 12.4 % (ref 11.5–15.5)
WBC: 3.5 10*3/uL — ABNORMAL LOW (ref 4.0–10.5)
nRBC: 0 % (ref 0.0–0.2)

## 2023-04-19 LAB — IRON AND TIBC
Iron: 65 ug/dL (ref 28–170)
Saturation Ratios: 21 % (ref 10.4–31.8)
TIBC: 304 ug/dL (ref 250–450)
UIBC: 239 ug/dL

## 2023-04-19 LAB — FERRITIN: Ferritin: 58 ng/mL (ref 11–307)

## 2023-04-19 LAB — VITAMIN B12: Vitamin B-12: 443 pg/mL (ref 180–914)

## 2023-04-19 LAB — RETIC PANEL
Immature Retic Fract: 10.3 % (ref 2.3–15.9)
RBC.: 3.34 MIL/uL — ABNORMAL LOW (ref 3.87–5.11)
Retic Count, Absolute: 29.1 10*3/uL (ref 19.0–186.0)
Retic Ct Pct: 0.9 % (ref 0.4–3.1)
Reticulocyte Hemoglobin: 34.2 pg (ref >27.9)

## 2023-04-19 NOTE — Progress Notes (Signed)
Hematology/Oncology Consult note [facility]  [phone] [phone]  Patient Care Team: Maurice Small, MD (Inactive) as PCP - General (Family Medicine) Carmina Miller, MD as Consulting Physician (Radiation Oncology) Creig Hines, MD as Consulting Physician (Oncology) Emelia Loron, MD as Consulting Physician (General Surgery)   Name of the patient: Theresa Ferguson  [id number]  August 30, 1946   Date of visit: 04/19/23  Diagnosis- stage I right breast cancer Normocytic anemia  Chief complaint/ Reason for visit-routine follow-up of breast cancer and anemia  Heme/Onc history: Patient is a 77 year old female who underwent routine bilateral screening mammogram in October 2021 which showed a possible distortion in the right breast.  This was followed by diagnostic mammogram and ultrasound which showed a 1.1 x 1.3 x 1.2 cm mass at the 9:30 position in the right breast.  No adenopathy noted in the right axilla.  Ultrasound-guided biopsy showed grade 1 ER PR positive HER-2 negative tumor with a Ki-67 of 10%.  Patient has met with Dr. Dwain Sarna and plan is for a lumpectomy.  Based on choosing wisely guidelines sentinel lymph node biopsy was deemed to be not necessary.    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  Grade 2 Ki-67 10% ER 95% PR 95% positive and HER-2 negative by FISH.  Oncotype came back at low risk and patient did not require any adjuvant chemotherapy.  She completed adjuvant radiation treatment and started Arimidex in March 2022.  She held it in July 2020 due to side effects.  She subsequently stopped all hormone therapy in 2022.  Interval history-overall patient is doing well and denies any specific complaints at this time.  Denies any breast concerns.  ECOG PS- 1 Pain scale- 0  Review of systems- Review of Systems  Constitutional:  Negative for chills, fever, malaise/fatigue and weight loss.   HENT:  Negative for congestion, ear discharge and nosebleeds.   Eyes:  Negative for blurred vision.  Respiratory:  Negative for cough, hemoptysis, sputum production, shortness of breath and wheezing.   Cardiovascular:  Negative for chest pain, palpitations, orthopnea and claudication.  Gastrointestinal:  Negative for abdominal pain, blood in stool, constipation, diarrhea, heartburn, melena, nausea and vomiting.  Genitourinary:  Negative for dysuria, flank pain, frequency, hematuria and urgency.  Musculoskeletal:  Negative for back pain, joint pain and myalgias.  Skin:  Negative for rash.  Neurological:  Negative for dizziness, tingling, focal weakness, seizures, weakness and headaches.  Endo/Heme/Allergies:  Does not bruise/bleed easily.  Psychiatric/Behavioral:  Negative for depression and suicidal ideas. The patient does not have insomnia.       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   Personal history of radiation therapy    PONV (postoperative nausea and vomiting)    Radiculopathy of lumbosacral region 02/02/2013   RLS (restless legs syndrome) 02/05/2015  Seasonal allergies      Past Surgical History:  Procedure Laterality Date   BREAST LUMPECTOMY Right 08/07/2020   right lumpectomy w/ radiation   BREAST LUMPECTOMY WITH RADIOACTIVE SEED LOCALIZATION Right 08/07/2020   Procedure: RIGHT BREAST LUMPECTOMY WITH RADIOACTIVE SEED LOCALIZATION;  Surgeon: Emelia Loron, MD;  Location:  [facility];  Service: General;  Laterality: Right;   FINGER SURGERY Right 08/31/1996   [facility]   kidney stones  08/31/1978   KNEE SURGERY Bilateral    partial right-2008,left full replacement-2006-Dr [facility]   LAMINECTOMY     l4-5   LOWER EXTREMITY ANGIOGRAPHY Left 12/29/2022   Procedure: Lower Extremity Angiography;  Surgeon: Renford Dills, MD;  Location: [facility];  Service: Cardiovascular;  Laterality: Left;   LUMBAR DISC SURGERY      Social History   Socioeconomic History   Marital status: Married    Spouse name: Fayrene Fearing   Number of children: 2   Years of education: College   Highest education level: Not on file  Occupational History   Occupation: retired    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   Patient has two adult children.   Patient is retired.   Patient has a college education.   Patient is right-handed.   Patient drinks two cups of coffee daily.   Social Determinants of Health   Financial Resource Strain: Not on file  Food Insecurity: Not on file  Transportation Needs: Not on file  Physical Activity: Not on file  Stress: Not on file  Social Connections: Not on file  Intimate Partner Violence: Not on file    Family History  Problem Relation Age of Onset   Diabetes Son    Breast cancer Neg Hx      Current Outpatient Medications:    clopidogrel (PLAVIX) 75 MG tablet, Take 1 tablet (75 mg total) by  mouth daily., Disp: 30 tablet, Rfl: 11   FARXIGA 10 MG TABS tablet, Take 10 mg by mouth daily., Disp: , Rfl:    ferrous sulfate 325 (65 FE) MG tablet, Take 325 mg by mouth daily with breakfast., Disp: , Rfl:    gabapentin (NEURONTIN) 800 MG tablet, Take one by mouth at morning . Lunch and 2 at night., Disp: 360 tablet, Rfl: 3   losartan (COZAAR) 25 MG tablet, Take 25 mg by mouth daily., Disp: , Rfl:    Magnesium 400 MG TABS, 1 tablet Orally once a day, Disp: , Rfl:    magnesium oxide (MAG-OX) 400 MG tablet, Take 400 mg by mouth daily., Disp: , Rfl:    metFORMIN (GLUCOPHAGE) 500 MG tablet, Take 1,000 mg by mouth 2 (two) times daily. At bedtime, Disp: , Rfl:    pantoprazole (PROTONIX) 40 MG tablet, Take 40 mg by mouth daily., Disp: , Rfl:    rosuvastatin (CRESTOR) 20 MG tablet, Take 20  mg by mouth daily., Disp: , Rfl:    vitamin B-12 (CYANOCOBALAMIN) 1000 MCG tablet, Take 1,000 mcg by mouth daily., Disp: , Rfl:    aspirin 81 MG tablet, Take 81 mg by mouth daily. (Patient not taking: Reported on 04/19/2023), Disp: , Rfl:    escitalopram (LEXAPRO) 20 MG tablet, TAKE 1 TABLET BY MOUTH ONCE DAILY, Disp: 90 tablet, Rfl: 0   fluticasone (FLONASE ALLERGY RELIEF) 50 MCG/ACT nasal spray, Place 2 sprays into both nostrils daily. (Patient not taking: Reported on 04/19/2023), Disp: , Rfl:    rosuvastatin (CRESTOR) 10 MG tablet, Take 10 mg by mouth daily., Disp: , Rfl:   Physical exam:  Vitals:   04/19/23 1119  BP: 135/79  Pulse: 76  Resp: 18  Temp: (!) 95.6 F (35.3 C)  TempSrc: Tympanic  SpO2: 99%  Weight: 147 lb 1.6 oz (66.7 kg)  Height: 5' 5.5" (1.664 m)   Physical Exam Cardiovascular:     Rate and Rhythm: Normal rate and regular rhythm.     Heart sounds: Normal heart sounds.  Pulmonary:     Effort: Pulmonary effort is normal.     Breath sounds: Normal breath sounds.  Abdominal:     General: Bowel sounds are normal.     Palpations: Abdomen is soft.  Skin:    General: Skin is warm and dry.   Neurological:     Mental Status: She is alert and oriented to person, place, and time.    Breast exam was performed in seated and lying down position. Patient is status post right lumpectomy with a well-healed surgical scar. No evidence of any palpable masses. No evidence of axillary adenopathy. No evidence of any palpable masses or lumps in the left breast. No evidence of leftt axillary adenopathy      Latest Ref Rng & Units 12/29/2022    9:29 AM  CMP  BUN 8 - 23 mg/dL 27   Creatinine 1.61 - 1.00 mg/dL 0.96       Latest Ref Rng & Units 04/19/2023   11:12 AM  CBC  WBC 4.0 - 10.5 K/uL 3.5   Hemoglobin 12.0 - 15.0 g/dL 04.5   Hematocrit 40.9 - 46.0 % 32.1   Platelets 150 - 400 K/uL 151     Assessment and plan- Patient is a 77 y.o. female for follow-up of following issues  History of breast cancer in 2020 stage I ER/PR positive HER2 negative: Patient chose not to continue with endocrine therapy beyond 2022.  Clinically she is doing well with No concerning signs and symptoms of recurrence based at this time.  She would be due for a mammogram in January 2025 which I will schedule.  With regards to her anemia patient has had chronic normocytic anemia with a hemoglobin that has remained between 10-11 at least for the last 2 years without a clear downward trend.  Mild leukopenia as well with a white count that typically fluctuates around 3.5 with a normal differential.  Platelet counts have been mostly normal.  There may be an element of MDS given her age.  Ferritin and iron studies B12 levels are presently normal.  Given the stability of her counts I will repeat her CBC in 6 months and see her back in 1 year    Visit Diagnosis 1. Encounter for follow-up surveillance of breast cancer   2. Normocytic anemia      Dr. Owens Shark, MD, MPH [facility] [phone] 04/19/2023 9:20 PM

## 2023-04-21 ENCOUNTER — Other Ambulatory Visit (INDEPENDENT_AMBULATORY_CARE_PROVIDER_SITE_OTHER): Payer: Self-pay | Admitting: Nurse Practitioner

## 2023-04-21 DIAGNOSIS — Z9889 Other specified postprocedural states: Secondary | ICD-10-CM

## 2023-04-22 ENCOUNTER — Ambulatory Visit (INDEPENDENT_AMBULATORY_CARE_PROVIDER_SITE_OTHER): Payer: Medicare Other

## 2023-04-22 ENCOUNTER — Encounter (INDEPENDENT_AMBULATORY_CARE_PROVIDER_SITE_OTHER): Payer: Self-pay | Admitting: Nurse Practitioner

## 2023-04-22 ENCOUNTER — Ambulatory Visit (INDEPENDENT_AMBULATORY_CARE_PROVIDER_SITE_OTHER): Payer: Medicare Other | Admitting: Nurse Practitioner

## 2023-04-22 VITALS — BP 130/77 | HR 83 | Resp 18 | Ht 65.5 in | Wt 146.2 lb

## 2023-04-22 DIAGNOSIS — I739 Peripheral vascular disease, unspecified: Secondary | ICD-10-CM

## 2023-04-22 DIAGNOSIS — E782 Mixed hyperlipidemia: Secondary | ICD-10-CM | POA: Diagnosis not present

## 2023-04-22 DIAGNOSIS — Z9889 Other specified postprocedural states: Secondary | ICD-10-CM | POA: Diagnosis not present

## 2023-04-22 DIAGNOSIS — E1169 Type 2 diabetes mellitus with other specified complication: Secondary | ICD-10-CM | POA: Diagnosis not present

## 2023-04-22 DIAGNOSIS — I8310 Varicose veins of unspecified lower extremity with inflammation: Secondary | ICD-10-CM | POA: Diagnosis not present

## 2023-04-22 NOTE — Progress Notes (Signed)
Subjective:    Patient ID: Theresa Ferguson, female    DOB: [birth year], 77 y.o.   MRN: [id number] Chief Complaint  Patient presents with   Follow-up    3 month follow up with ABI & L le art duplex    The patient returns to the office for followup and review of the noninvasive studies.   There have been no interval changes in lower extremity symptoms. No interval shortening of the patient's claudication distance or development of rest pain symptoms. No new ulcers or wounds have occurred since the last visit.  There have been no significant changes to the patient's overall health care.  She does note incidentally however that she has varicosities bilaterally that are bothersome for her.  She notes that the varicosities on her left lower extremity are worse and they have recently become tender and painful and uncomfortable for her.  The patient denies amaurosis fugax or recent TIA symptoms. There are no documented recent neurological changes noted. There is no history of DVT, PE or superficial thrombophlebitis. The patient denies recent episodes of angina or shortness of breath.   ABI Rt=1.19 and Lt=1.08  (previous ABI's Rt=1.01 and Lt=1.04) Duplex ultrasound of the right lower extremity shows triphasic tibial artery waveforms and normal toe waveforms.  The left lower extremity has primarily triphasic with normal biphasic but also normal toe waveforms.  Previously placed stents are patent.    Review of Systems  Cardiovascular:  Negative for leg swelling.  Skin:  Negative for wound.  All other systems reviewed and are negative.      Objective:   Physical Exam Vitals reviewed.  Cardiovascular:     Rate and Rhythm: Normal rate.  Pulmonary:     Effort: Pulmonary effort is normal.  Musculoskeletal:        General: Tenderness present.  Skin:    General: Skin is warm and dry.  Neurological:     Mental Status: She is alert and oriented to person, place, and time.  Psychiatric:         Mood and Affect: Mood normal.        Behavior: Behavior normal.        Thought Content: Thought content normal.        Judgment: Judgment normal.     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   Personal history of radiation therapy    PONV (postoperative nausea and vomiting)    Radiculopathy of lumbosacral region 02/02/2013   RLS (restless legs syndrome) 02/05/2015   Seasonal allergies     Social History   Socioeconomic History   Marital status: Married    Spouse name: Fayrene Fearing   Number of children: 2   Years of education: [employer] education level: Not on file  Occupational History   Occupation: retired    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   Patient has two adult children.   Patient is retired.   Patient has a college education.   Patient is right-handed.   Patient drinks two cups of coffee daily.   Social Determinants of Health   Financial Resource Strain: Not on file  Food Insecurity: Not on file  Transportation Needs: Not on file  Physical Activity: Not on  file  Stress: Not on file  Social Connections: Not on file  Intimate Partner Violence: Not on file    Past Surgical History:  Procedure Laterality Date   BREAST LUMPECTOMY Right 08/07/2020   right lumpectomy w/ radiation   BREAST LUMPECTOMY WITH RADIOACTIVE SEED LOCALIZATION Right 08/07/2020   Procedure: RIGHT BREAST LUMPECTOMY WITH RADIOACTIVE SEED LOCALIZATION;  Surgeon: Emelia Loron, MD;  Location: [facility];  Service: General;  Laterality: Right;   FINGER SURGERY Right 08/31/1996   [facility]   kidney stones  08/31/1978   KNEE SURGERY Bilateral    partial right-2008,left full replacement-2006-Dr [facility]   LAMINECTOMY     l4-5   LOWER EXTREMITY ANGIOGRAPHY Left 12/29/2022   Procedure: Lower Extremity Angiography;  Surgeon: Renford Dills, MD;  Location: [facility];  Service: Cardiovascular;  Laterality: Left;   LUMBAR DISC SURGERY      Family History  Problem Relation Age of Onset   Diabetes Son    Breast cancer Neg Hx     Allergies  Allergen Reactions   Codeine    Latex    Morphine And Codeine    Nickel    Penicillins    Atorvastatin Other (See Comments)   Cerivastatin Other (See Comments)   Fluvastatin Other (See Comments)       Latest Ref Rng & Units 04/19/2023   11:12 AM 04/17/2022   10:29 AM 07/15/2021    1:28 PM  CBC  WBC 4.0 - 10.5 K/uL 3.5  3.6  3.5   Hemoglobin 12.0 - 15.0 g/dL 40.9  81.1  91.4   Hematocrit 36.0 - 46.0 % 32.1  32.1  31.1   Platelets 150 - 400 K/uL 151  151  135       CMP     Component Value Date/Time   NA 139 12/10/2020 1307   K 4.1 12/10/2020 1307   K 4.0 09/20/2014 1223   CL 105 12/10/2020 1307   CO2 25 12/10/2020 1307   GLUCOSE 115 (H) 12/10/2020 1307   BUN 27 (H) 12/29/2022 0929   CREATININE 1.23 (H) 12/29/2022 0929   CALCIUM 9.4 12/10/2020 1307   PROT 7.3 12/10/2020 1307   ALBUMIN 4.5 12/10/2020 1307   AST 20 12/10/2020 1307   ALT 14 12/10/2020 1307   ALKPHOS 39 12/10/2020  1307   BILITOT 0.6 12/10/2020 1307   GFRNONAA 46 (L) 12/29/2022 0929     No results found.     Assessment & Plan:   1. Peripheral arterial disease with history of revascularization (HCC)  Recommend:  The  patient has evidence of atherosclerosis of the lower extremities with no claudication.  The patient does not voice lifestyle limiting changes at this point in time.  Noninvasive studies do not suggest clinically significant change.  No invasive studies, angiography or surgery at this time The patient should continue walking and begin a more formal exercise program.  The patient should continue antiplatelet therapy and aggressive treatment of the lipid abnormalities  No changes in the patient's medications at this time  Continued surveillance is indicated as atherosclerosis is likely to progress with time.    The patient will continue follow up with noninvasive studies as ordered.   2. Type 2 diabetes mellitus with other specified complication, unspecified whether long term insulin use (HCC) Continue hypoglycemic medications as already ordered, these medications have been reviewed and there are no changes at this time.  Hgb A1C to be monitored as already arranged by primary service  3. Mixed hyperlipidemia Continue statin as ordered and reviewed, no changes at this time  4. Varicose veins with inflammation The patient does have some tenderness and notable varicosities bilaterally.  The left being worse.  I have discussed conservative treatment therapies for the patient including use of medical grade compression, elevation and activity.  When she returns to her follow-up visit in 6 months we will evaluate for venous reflux in order to determine what treatment options may be available if she is continuing pain or discomfort.   Current Outpatient Medications on File Prior to Visit  Medication Sig Dispense Refill   clopidogrel (PLAVIX) 75 MG tablet Take 1 tablet (75 mg total) by  mouth daily. 30 tablet 11   escitalopram (LEXAPRO) 20 MG tablet TAKE 1 TABLET BY MOUTH ONCE DAILY 90 tablet 0   FARXIGA 10 MG TABS tablet Take 10 mg by mouth daily.     ferrous sulfate 325 (65 FE) MG tablet Take 325 mg by mouth daily with breakfast.     fluticasone (FLONASE ALLERGY RELIEF) 50 MCG/ACT nasal spray Place 2 sprays into both nostrils as needed.     gabapentin (NEURONTIN) 800 MG tablet Take one by mouth at morning . Lunch and 2 at night. 360 tablet 3   losartan (COZAAR) 25 MG tablet Take 25 mg by mouth daily.     Magnesium 400 MG TABS 1 tablet Orally once a day     metFORMIN (GLUCOPHAGE) 500 MG tablet Take 1,000 mg by mouth 2 (two) times daily. At bedtime     pantoprazole (PROTONIX) 40 MG tablet Take 40 mg by mouth daily.     rosuvastatin (CRESTOR) 10 MG tablet Take 10 mg by mouth daily.     vitamin B-12 (CYANOCOBALAMIN) 1000 MCG tablet Take 1,000 mcg by mouth daily.     No current facility-administered medications on file prior to visit.    There are no Patient Instructions on file for this visit. No follow-ups on file.   Georgiana Spinner, NP

## 2023-04-23 LAB — VAS US ABI WITH/WO TBI
Left ABI: 1.08
Right ABI: 1.19

## 2023-04-26 ENCOUNTER — Telehealth: Payer: Self-pay | Admitting: Family Medicine

## 2023-04-26 NOTE — Telephone Encounter (Signed)
Last seen on 02/25/22 Follow up scheduled on 11/16/23 Rx last filled on 04/19/23 #90 tablets (30 day supply)   I called patient and she reports she doesn't need Rx now. Pt said she takes medication 4 times per day, no three times per day. Pt said she will need refill in mid- sept. She does not need a refill now.  Encounter closed.

## 2023-04-26 NOTE — Telephone Encounter (Signed)
LVM and sent mychart msg informing pt of need to reschedule 05/04/23 appt - NP out

## 2023-04-26 NOTE — Telephone Encounter (Signed)
Pt is requesting a refill for gabapentin (NEURONTIN) 800 MG tablet .  Pharmacy: [facility]

## 2023-04-28 DIAGNOSIS — Z85828 Personal history of other malignant neoplasm of skin: Secondary | ICD-10-CM | POA: Diagnosis not present

## 2023-04-29 DIAGNOSIS — I129 Hypertensive chronic kidney disease with stage 1 through stage 4 chronic kidney disease, or unspecified chronic kidney disease: Secondary | ICD-10-CM | POA: Diagnosis not present

## 2023-04-29 DIAGNOSIS — N183 Chronic kidney disease, stage 3 unspecified: Secondary | ICD-10-CM | POA: Diagnosis not present

## 2023-04-29 DIAGNOSIS — N2581 Secondary hyperparathyroidism of renal origin: Secondary | ICD-10-CM | POA: Diagnosis not present

## 2023-04-29 DIAGNOSIS — D631 Anemia in chronic kidney disease: Secondary | ICD-10-CM | POA: Diagnosis not present

## 2023-04-29 DIAGNOSIS — R809 Proteinuria, unspecified: Secondary | ICD-10-CM | POA: Diagnosis not present

## 2023-04-30 DIAGNOSIS — Z08 Encounter for follow-up examination after completed treatment for malignant neoplasm: Secondary | ICD-10-CM | POA: Diagnosis not present

## 2023-04-30 DIAGNOSIS — D225 Melanocytic nevi of trunk: Secondary | ICD-10-CM | POA: Diagnosis not present

## 2023-04-30 DIAGNOSIS — L821 Other seborrheic keratosis: Secondary | ICD-10-CM | POA: Diagnosis not present

## 2023-04-30 DIAGNOSIS — Z85828 Personal history of other malignant neoplasm of skin: Secondary | ICD-10-CM | POA: Diagnosis not present

## 2023-05-04 ENCOUNTER — Ambulatory Visit: Payer: Medicare Other | Admitting: Family Medicine

## 2023-05-24 ENCOUNTER — Telehealth: Payer: Self-pay | Admitting: Family Medicine

## 2023-05-24 DIAGNOSIS — G629 Polyneuropathy, unspecified: Secondary | ICD-10-CM

## 2023-05-24 DIAGNOSIS — G2581 Restless legs syndrome: Secondary | ICD-10-CM

## 2023-05-24 MED ORDER — GABAPENTIN 800 MG PO TABS
ORAL_TABLET | ORAL | 1 refills | Status: DC
Start: 2023-05-24 — End: 2023-11-16

## 2023-05-24 NOTE — Telephone Encounter (Signed)
Pt request refill for gabapentin (NEURONTIN) 800 MG tablet sent to  [facility]

## 2023-05-31 DIAGNOSIS — D123 Benign neoplasm of transverse colon: Secondary | ICD-10-CM | POA: Diagnosis not present

## 2023-05-31 DIAGNOSIS — K635 Polyp of colon: Secondary | ICD-10-CM | POA: Diagnosis not present

## 2023-05-31 DIAGNOSIS — Z86010 Personal history of colonic polyps: Secondary | ICD-10-CM | POA: Diagnosis not present

## 2023-05-31 DIAGNOSIS — D122 Benign neoplasm of ascending colon: Secondary | ICD-10-CM | POA: Diagnosis not present

## 2023-05-31 DIAGNOSIS — Z1211 Encounter for screening for malignant neoplasm of colon: Secondary | ICD-10-CM | POA: Diagnosis not present

## 2023-06-13 DIAGNOSIS — H66001 Acute suppurative otitis media without spontaneous rupture of ear drum, right ear: Secondary | ICD-10-CM | POA: Diagnosis not present

## 2023-06-13 DIAGNOSIS — H8309 Labyrinthitis, unspecified ear: Secondary | ICD-10-CM | POA: Diagnosis not present

## 2023-06-13 DIAGNOSIS — B9689 Other specified bacterial agents as the cause of diseases classified elsewhere: Secondary | ICD-10-CM | POA: Diagnosis not present

## 2023-06-13 DIAGNOSIS — J019 Acute sinusitis, unspecified: Secondary | ICD-10-CM | POA: Diagnosis not present

## 2023-06-17 DIAGNOSIS — M50321 Other cervical disc degeneration at C4-C5 level: Secondary | ICD-10-CM | POA: Diagnosis not present

## 2023-06-17 DIAGNOSIS — I6523 Occlusion and stenosis of bilateral carotid arteries: Secondary | ICD-10-CM | POA: Diagnosis not present

## 2023-06-17 DIAGNOSIS — M50323 Other cervical disc degeneration at C6-C7 level: Secondary | ICD-10-CM | POA: Diagnosis not present

## 2023-06-17 DIAGNOSIS — M5412 Radiculopathy, cervical region: Secondary | ICD-10-CM | POA: Diagnosis not present

## 2023-06-17 DIAGNOSIS — H6692 Otitis media, unspecified, left ear: Secondary | ICD-10-CM | POA: Diagnosis not present

## 2023-06-17 DIAGNOSIS — M542 Cervicalgia: Secondary | ICD-10-CM | POA: Diagnosis not present

## 2023-06-19 ENCOUNTER — Ambulatory Visit
Admission: RE | Admit: 2023-06-19 | Discharge: 2023-06-19 | Disposition: A | Discharge status: Home / Self Care | Payer: Medicare Other | Source: Ambulatory Visit

## 2023-06-19 VITALS — BP 112/71 | HR 62 | Temp 98.1°F | Resp 18

## 2023-06-19 DIAGNOSIS — B029 Zoster without complications: Secondary | ICD-10-CM | POA: Diagnosis not present

## 2023-06-19 DIAGNOSIS — M792 Neuralgia and neuritis, unspecified: Secondary | ICD-10-CM

## 2023-06-19 DIAGNOSIS — R238 Other skin changes: Secondary | ICD-10-CM | POA: Diagnosis not present

## 2023-06-19 MED ORDER — LIDOCAINE 5 % EX OINT: 1.0000 | TOPICAL_OINTMENT | Freq: Two times a day (BID) | CUTANEOUS | 0 refills | Status: DC | PRN

## 2023-06-19 MED ORDER — VALACYCLOVIR HCL 1 G PO TABS: 1000.0000 mg | ORAL_TABLET | Freq: Two times a day (BID) | ORAL | 0 refills | Status: AC

## 2023-06-19 NOTE — ED Provider Notes (Signed)
Theresa Ferguson    CSN: [id number] Arrival date & time: 06/19/23  1306      History   Chief Complaint Chief Complaint  Patient presents with   Blister    I think I have shingles. - Entered by patient    HPI Theresa Ferguson is a 77 y.o. female.   Patient presents today with a 3 to 4-day history of painful rash involving her left arm.  She denies any additional symptoms including fever, nausea, vomiting, cough, congestion.  She reports pain is rated 9 on a 0-10 pain scale, described as burning/stinging, no aggravating or alleviating factors identified.  She has had difficulty sleeping as result of the pain.  She has never had shingles before but did have chickenpox as a child.  She has not had this vaccination.  She has been taking Tylenol without improvement of symptoms.  Denies any changes to personal hygiene products including soaps or detergents.  Denies any recent medication changes.  She does have a history of breast cancer but is not currently receiving treatment.    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   Personal history of radiation therapy    PONV (postoperative nausea and vomiting)    Radiculopathy of lumbosacral region 02/02/2013   RLS (restless legs syndrome) 02/05/2015   Seasonal allergies     Patient Active Problem List   Diagnosis Date Noted   PAD (peripheral artery disease) (HCC) 12/20/2022   Leg pain 06/30/2022   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   Chronic venous insufficiency 06/24/2022   Diabetes (HCC) 06/24/2022   Hyperlipidemia 06/24/2022   Iron deficiency anemia 04/15/2021   Anxiety 03/26/2021   Goals of care, counseling/discussion 08/19/2020   Malignant  neoplasm of upper-outer quadrant of right breast in female, 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   Other seasonal allergic rhinitis 05/13/2015   Restless legs syndrome (RLS) 05/13/2015   Diabetic polyneuropathy associated with diabetes mellitus due to underlying condition (HCC) 05/13/2015   RLS (restless legs syndrome) 02/05/2015   Neuropathy 02/05/2015   DDD (degenerative disc disease), lumbar 02/02/2014   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   Radiculopathy of lumbosacral region 02/02/2013    Past Surgical History:  Procedure Laterality Date   BREAST LUMPECTOMY Right 08/07/2020   right lumpectomy w/ radiation   BREAST LUMPECTOMY WITH RADIOACTIVE SEED LOCALIZATION Right 08/07/2020   Procedure: RIGHT BREAST LUMPECTOMY WITH RADIOACTIVE SEED LOCALIZATION;  Surgeon: Emelia Loron, MD;  Location: [facility];  Service: General;  Laterality: Right;   FINGER SURGERY Right 08/31/1996   [facility]   kidney stones  08/31/1978   KNEE SURGERY Bilateral    partial right-2008,left full replacement-2006-Dr [facility]   LAMINECTOMY     l4-5   LOWER EXTREMITY ANGIOGRAPHY Left 12/29/2022   Procedure: Lower Extremity Angiography;  Surgeon: Renford Dills, MD;  Location: [facility];  Service: Cardiovascular;  Laterality: Left;   LUMBAR DISC SURGERY      OB History   No obstetric history on file.      Home Medications    Prior to Admission medications   Medication Sig Start Date End Date  Taking? Authorizing Provider  doxycycline (VIBRAMYCIN) 100 MG capsule Take 1 capsule by mouth 2 (two) times daily. 06/17/23 06/24/23 Yes [provider]  lidocaine (XYLOCAINE) 5 % ointment Apply 1 Application topically 2 (two) times daily as needed. 06/19/23  Yes Gabbriella Presswood, Noberto Retort, PA-C  meclizine (ANTIVERT) 12.5 MG tablet 1-2 tabs po TID prn "dizziness" 06/13/23 06/23/23 Yes [provider]  metaxalone (SKELAXIN) 800 MG tablet Take by mouth. 06/17/23   Yes [provider]  predniSONE (DELTASONE) 10 MG tablet Take by mouth. 06/17/23 06/22/23 Yes [provider]  valACYclovir (VALTREX) 1000 MG tablet Take 1 tablet (1,000 mg total) by mouth in the morning and at bedtime for 7 days. 06/19/23 06/26/23 Yes Kajal Scalici, Noberto Retort, PA-C  clopidogrel (PLAVIX) 75 MG tablet Take 1 tablet (75 mg total) by mouth daily. 12/30/22   Schnier, Latina Craver, MD  escitalopram (LEXAPRO) 20 MG tablet TAKE 1 TABLET BY MOUTH ONCE DAILY 05/23/21   Mauro Kaufmann, NP  FARXIGA 10 MG TABS tablet Take 10 mg by mouth daily. 12/04/22   [provider]  ferrous sulfate 325 (65 FE) MG tablet Take 325 mg by mouth daily with breakfast.    [provider]  fluticasone (FLONASE ALLERGY RELIEF) 50 MCG/ACT nasal spray Place 2 sprays into both nostrils as needed.    [provider]  gabapentin (NEURONTIN) 800 MG tablet Take one by mouth at morning . Lunch and 2 at night. 05/24/23   Lomax, Amy, NP  losartan (COZAAR) 25 MG tablet Take 25 mg by mouth daily.    [provider]  Magnesium 400 MG TABS 1 tablet Orally once a day    [provider]  metFORMIN (GLUCOPHAGE) 500 MG tablet Take 1,000 mg by mouth 2 (two) times daily. At bedtime    [provider]  pantoprazole (PROTONIX) 40 MG tablet Take 40 mg by mouth daily.    [provider]  rosuvastatin (CRESTOR) 10 MG tablet Take 10 mg by mouth daily.    [provider]  vitamin B-12 (CYANOCOBALAMIN) 1000 MCG tablet Take 1,000 mcg by mouth daily.    [provider]    Family History Family History  Problem Relation Age of Onset   Diabetes Son    Breast cancer Neg Hx     Social History Social History   Tobacco Use   Smoking status: Former    Current packs/day: 0.00    Types: Cigarettes    Quit date: 1982    Years since quitting: 42.8   Smokeless tobacco: Never  Vaping Use   Vaping status: Never Used  Substance Use Topics   Alcohol use: Yes     Comment: socially   Drug use: No     Allergies   Codeine, Latex, Morphine and codeine, Nickel, Penicillins, Atorvastatin, Cerivastatin, and Fluvastatin   Review of Systems Review of Systems  Constitutional:  Positive for activity change. Negative for appetite change, fatigue and fever.  Gastrointestinal:  Negative for abdominal pain, diarrhea and vomiting.  Musculoskeletal:  Negative for arthralgias and myalgias.  Skin:  Positive for rash.  Neurological:  Negative for weakness and numbness.     Physical Exam Triage Vital Signs ED Triage Vitals  Encounter Vitals Group     BP 06/19/23 1329 112/71     Systolic BP Percentile --      Diastolic BP Percentile --      Pulse Rate 06/19/23 1329 62     Resp 06/19/23 1329 18  Temp 06/19/23 1329 98.1 F (36.7 C)     Temp src --      SpO2 06/19/23 1329 98 %     Weight --      Height --      Head Circumference --      Peak Flow --      Pain Score 06/19/23 1323 8     Pain Loc --      Pain Education --      Exclude from Growth Chart --    No data found.  Updated Vital Signs BP 112/71   Pulse 62   Temp 98.1 F (36.7 C)   Resp 18   SpO2 98%   Visual Acuity Right Eye Distance:   Left Eye Distance:   Bilateral Distance:    Right Eye Near:   Left Eye Near:    Bilateral Near:     Physical Exam Vitals reviewed.  Constitutional:      General: She is awake. She is not in acute distress.    Appearance: Normal appearance. She is well-developed. She is not ill-appearing.     Comments: Very pleasant female appears stated age in no acute distress sitting comfortably in exam room  HENT:     Head: Normocephalic and atraumatic.  Cardiovascular:     Rate and Rhythm: Normal rate and regular rhythm.     Heart sounds: Normal heart sounds, S1 normal and S2 normal. No murmur heard. Pulmonary:     Effort: Pulmonary effort is normal.     Breath sounds: Normal breath sounds. No wheezing, rhonchi or rales.     Comments: Clear to  auscultation bilaterally Skin:    Findings: Rash present. Rash is macular, papular and vesicular.     Comments: Maculopapular rash with vesicles noted left arm with radiation into palm.  No bleeding or drainage noted.  No additional rash on exam.  Psychiatric:        Behavior: Behavior is cooperative.      UC Treatments / Results  Labs (all labs ordered are listed, but only abnormal results are displayed) Labs Reviewed - No data to display  EKG   Radiology No results found.  Procedures Procedures (including critical care time)  Medications Ordered in UC Medications - No data to display  Initial Impression / Assessment and Plan / UC Course  I have reviewed the triage vital signs and the nursing notes.  Pertinent labs & imaging results that were available during my care of the patient were reviewed by me and considered in my medical decision making (see chart for details).     Patient is well-appearing, afebrile, nontoxic, nontachycardic.  Rash is consistent with shingles in C5/C6 dermatome.  We discussed that typically we do not start antiviral therapy when symptoms have not present for more than a few days, however, given age will go ahead and treat with Valtrex.  This medication was dose adjusted based on her creatinine from 12/29/2022 which was 1.23 with calculi creatinine clearance of 40 mL/min.  She is already on an appropriate dose of gabapentin was encouraged to continue this medication.  She can use Tylenol for additional symptom relief.  She was given lidocaine topical cream for additional pain relief.  Recommend close follow-up with her primary care.  We discussed that if anything worsens and she has spread of rash, fever, nausea, vomiting, weakness that she needs to be seen immediately.  Strict return precautions given.  All questions answered to patient and husband satisfaction.  Final Clinical Impressions(s) / UC Diagnoses   Final diagnoses:  Herpes zoster without  complication  Vesicular rash  Neuropathic pain     Discharge Instructions      We are treating you for shingles.  Take valacyclovir 1000 mg twice daily for 7 days.  Continue your Tylenol for pain relief.  Gabapentin should also help with your pain so take this as prescribed.  Apply lidocaine ointment up to twice a day as needed.  Follow-up with your primary care if symptoms or not improving.  If anything worsens and you have spread of rash, fever, nausea/vomiting, increasing pain you need to be seen immediately.     ED Prescriptions     Medication Sig Dispense Auth. Provider   valACYclovir (VALTREX) 1000 MG tablet Take 1 tablet (1,000 mg total) by mouth in the morning and at bedtime for 7 days. 14 tablet Lucious Zou K, PA-C   lidocaine (XYLOCAINE) 5 % ointment Apply 1 Application topically 2 (two) times daily as needed. 50 g Acey Woodfield K, PA-C      PDMP not reviewed this encounter.   Jeani Hawking, PA-C 06/19/23 1347

## 2023-06-19 NOTE — Discharge Instructions (Signed)
We are treating you for shingles.  Take valacyclovir 1000 mg twice daily for 7 days.  Continue your Tylenol for pain relief.  Gabapentin should also help with your pain so take this as prescribed.  Apply lidocaine ointment up to twice a day as needed.  Follow-up with your primary care if symptoms or not improving.  If anything worsens and you have spread of rash, fever, nausea/vomiting, increasing pain you need to be seen immediately.

## 2023-06-19 NOTE — ED Triage Notes (Signed)
Patient to Urgent Care with complaints of rash present to left forearm/ hand.  Symptoms started Wednesday. Started with nerve pain then developed blisters/ rash.

## 2023-06-24 ENCOUNTER — Emergency Department: Payer: Medicare Other

## 2023-06-24 ENCOUNTER — Other Ambulatory Visit: Payer: Self-pay

## 2023-06-24 ENCOUNTER — Emergency Department
Admission: EM | Admit: 2023-06-24 | Discharge: 2023-06-24 | Disposition: A | Discharge status: Home / Self Care | Payer: Medicare Other | Attending: Emergency Medicine | Admitting: Emergency Medicine

## 2023-06-24 ENCOUNTER — Ambulatory Visit: Payer: Self-pay

## 2023-06-24 DIAGNOSIS — E1140 Type 2 diabetes mellitus with diabetic neuropathy, unspecified: Secondary | ICD-10-CM | POA: Insufficient documentation

## 2023-06-24 DIAGNOSIS — R29818 Other symptoms and signs involving the nervous system: Secondary | ICD-10-CM | POA: Diagnosis not present

## 2023-06-24 DIAGNOSIS — R110 Nausea: Secondary | ICD-10-CM | POA: Insufficient documentation

## 2023-06-24 DIAGNOSIS — R42 Dizziness and giddiness: Secondary | ICD-10-CM | POA: Diagnosis not present

## 2023-06-24 DIAGNOSIS — I10 Essential (primary) hypertension: Secondary | ICD-10-CM | POA: Insufficient documentation

## 2023-06-24 DIAGNOSIS — I6782 Cerebral ischemia: Secondary | ICD-10-CM | POA: Diagnosis not present

## 2023-06-24 LAB — URINALYSIS, ROUTINE W REFLEX MICROSCOPIC
Bacteria, UA: NONE SEEN
Bilirubin Urine: NEGATIVE
Glucose, UA: >=500 mg/dL — AB
Hgb urine dipstick: NEGATIVE
Ketones, ur: 5 mg/dL — AB
Leukocytes,Ua: NEGATIVE
Nitrite: NEGATIVE
Protein, ur: 30 mg/dL — AB
Specific Gravity, Urine: 1.023 (ref 1.005–1.030)
Squamous Epithelial / HPF: 0 /[HPF] (ref 0–5)
pH: 5 (ref 5.0–8.0)

## 2023-06-24 LAB — BASIC METABOLIC PANEL
Anion gap: 10 (ref 5–15)
BUN: 30 mg/dL — ABNORMAL HIGH (ref 8–23)
CO2: 23 mmol/L (ref 22–32)
Calcium: 9.5 mg/dL (ref 8.9–10.3)
Chloride: 104 mmol/L (ref 98–111)
Creatinine, Ser: 0.96 mg/dL (ref 0.44–1.00)
GFR, Estimated: >60 mL/min (ref >60)
Glucose, Bld: 156 mg/dL — ABNORMAL HIGH (ref 70–99)
Potassium: 3.2 mmol/L — ABNORMAL LOW (ref 3.5–5.1)
Sodium: 137 mmol/L (ref 135–145)

## 2023-06-24 LAB — CBC
HCT: 32.8 % — ABNORMAL LOW (ref 36.0–46.0)
Hemoglobin: 11.1 g/dL — ABNORMAL LOW (ref 12.0–15.0)
MCH: 31.4 pg (ref 26.0–34.0)
MCHC: 33.8 g/dL (ref 30.0–36.0)
MCV: 92.9 fL (ref 80.0–100.0)
Platelets: 169 10*3/uL (ref 150–400)
RBC: 3.53 MIL/uL — ABNORMAL LOW (ref 3.87–5.11)
RDW: 13.2 % (ref 11.5–15.5)
WBC: 6.3 10*3/uL (ref 4.0–10.5)
nRBC: 0 % (ref 0.0–0.2)

## 2023-06-24 MED ORDER — MECLIZINE HCL 25 MG PO TABS
25.0000 mg | ORAL_TABLET | Freq: Once | ORAL | Status: AC
Start: 2023-06-24 — End: 2023-06-24
  Administered 2023-06-24: 25 mg via ORAL
  Filled 2023-06-24: qty 1

## 2023-06-24 MED ORDER — MECLIZINE HCL 12.5 MG PO TABS: 12.5000 mg | ORAL_TABLET | Freq: Three times a day (TID) | ORAL | 0 refills | Status: DC | PRN

## 2023-06-24 NOTE — ED Triage Notes (Signed)
Pt presents to ED with c/o of dizziness, pt states HX of vertigo and inner ear issues. Pt denies head injury.   Pt currently has shingles. NAD noted.

## 2023-06-24 NOTE — ED Provider Notes (Signed)
[facility] Provider Note    Event Date/Time   First MD Initiated Contact with Patient 06/24/23 1510     (approximate)   History   Dizziness   HPI  Theresa Ferguson is a 77 y.o. 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  Last known normal was 9 PM when she went to bed last night.  Of note she started on Valtrex several days ago for shingles diagnosis.  It is affecting her mid body not her face.  She describes her vertigo as similar to her vertiginous symptoms experienced in the past, sense of imbalance, room spinning.  She feels nauseated when she feels dizzy.  Denies any other acute medical complaints.   Independent Historian contributed to assessment above: Family members at bedside to corroborate information past medical history as above    Physical Exam   Triage Vital Signs: ED Triage Vitals [06/24/23 1133]  Encounter Vitals Group     BP (!) 140/83     Systolic BP Percentile      Diastolic BP Percentile      Pulse Rate 88     Resp 18     Temp 98 F (36.7 C)     Temp Source Oral     SpO2 98 %     Weight      Height      Head Circumference      Peak Flow      Pain Score 0     Pain Loc      Pain Education      Exclude from Growth Chart     Most recent vital signs: Vitals:   06/24/23 1133 06/24/23 1414  BP: (!) 140/83 (!) 146/82  Pulse: 88 83  Resp: 18 16  Temp: 98 F (36.7 C) 97.8 F (36.6 C)  SpO2: 98% 96%    General: Awake, no distress.  CV:  Good peripheral perfusion.  Resp:  Normal effort.  Abd:  No distention.  Other:  Dysmetria with finger-to-nose on the right side greater than the left.  Unsteady gait says she feels vertiginous then.  Curiously she is  unable to fully follow directions to assess for nystagmus that she continues to move her head and is unable to concentrate on my fingers as I move them.  He is otherwise able to follow all commands and has no motor or sensory deficits facial asymmetry or dysarthria.   ED Results / Procedures / Treatments   Labs (all labs ordered are listed, but only abnormal results are displayed) Labs Reviewed  BASIC METABOLIC PANEL - Abnormal; Notable for the following components:      Result Value   Potassium 3.2 (*)    Glucose, Bld 156 (*)    BUN 30 (*)    All other components within normal limits  CBC - Abnormal; Notable for the following components:   RBC 3.53 (*)    Hemoglobin 11.1 (*)    HCT 32.8 (*)    All other components within normal limits  URINALYSIS, ROUTINE W REFLEX MICROSCOPIC - Abnormal; Notable for the following components:   Color, Urine YELLOW (*)    APPearance CLEAR (*)    Glucose, UA >=500 (*)  Ketones, ur 5 (*)    Protein, ur 30 (*)    All other components within normal limits  CBG MONITORING, ED     I ordered and reviewed the above labs they are notable for cell counts electrolytes largely unremarkable.    RADIOLOGY I independently reviewed and interpreted MRI of the brain see no obvious masses or bleeding I also reviewed radiologist's formal read.   PROCEDURES:  Critical Care performed: No  Procedures   MEDICATIONS ORDERED IN ED: Medications  meclizine (ANTIVERT) tablet 25 mg (25 mg Oral Given 06/24/23 1648)    IMPRESSION / MDM / ASSESSMENT AND PLAN / ED COURSE  I reviewed the triage vital signs and the nursing notes.                                Patient's presentation is most consistent with acute presentation with potential threat to life or bodily function.  Differential diagnosis includes, but is not limited to, central versus peripheral vertigo, dysrhythmia, electrolyte disturbance   The patient is on the cardiac monitor to evaluate for  evidence of arrhythmia and/or significant heart rate changes.  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  However with dysmetria, gait instability, considered central vertigo got MRI of the brain which fortunately is negative for stroke.  Symptoms better with meclizine.  Discharge.       FINAL CLINICAL IMPRESSION(S) / ED DIAGNOSES   Final diagnoses:  Dizziness  Vertigo     Rx / DC Orders   ED Discharge Orders          Ordered    meclizine (ANTIVERT) 12.5 MG tablet  3 times daily PRN        06/24/23 1839             Note:  This document was prepared using Dragon voice recognition software and may include unintentional dictation errors.    Pilar Jarvis, MD 06/24/23 2308

## 2023-06-24 NOTE — Discharge Instructions (Signed)
Fortunately your MRI did not show any signs of a stroke to account for your dizziness.  Try meclizine as prescribed which may help your symptoms and follow-up with your ENT doctor and your primary doctor for further testing and treatment as needed.  Thank you for choosing [place] for your health care today!  Please see your primary doctor this week for a follow up appointment.   If you have any new, worsening, or unexpected symptoms call your doctor right away or come back to the emergency department for reevaluation.  It was my pleasure to care for you today.   Daneil Dan Modesto Charon, MD

## 2023-06-24 NOTE — ED Notes (Signed)
Pt talking with MRI, Pt husband left to go home quick and will be back. PT states her dizziness has improved since this morning, prior to medication

## 2023-07-02 DIAGNOSIS — E1169 Type 2 diabetes mellitus with other specified complication: Secondary | ICD-10-CM | POA: Diagnosis not present

## 2023-07-02 DIAGNOSIS — B0229 Other postherpetic nervous system involvement: Secondary | ICD-10-CM | POA: Diagnosis not present

## 2023-07-02 DIAGNOSIS — E782 Mixed hyperlipidemia: Secondary | ICD-10-CM | POA: Diagnosis not present

## 2023-07-02 DIAGNOSIS — F39 Unspecified mood [affective] disorder: Secondary | ICD-10-CM | POA: Diagnosis not present

## 2023-07-02 DIAGNOSIS — N1831 Chronic kidney disease, stage 3a: Secondary | ICD-10-CM | POA: Diagnosis not present

## 2023-07-02 DIAGNOSIS — I129 Hypertensive chronic kidney disease with stage 1 through stage 4 chronic kidney disease, or unspecified chronic kidney disease: Secondary | ICD-10-CM | POA: Diagnosis not present

## 2023-07-02 DIAGNOSIS — K219 Gastro-esophageal reflux disease without esophagitis: Secondary | ICD-10-CM | POA: Diagnosis not present

## 2023-07-02 DIAGNOSIS — R42 Dizziness and giddiness: Secondary | ICD-10-CM | POA: Diagnosis not present

## 2023-07-02 DIAGNOSIS — E1149 Type 2 diabetes mellitus with other diabetic neurological complication: Secondary | ICD-10-CM | POA: Diagnosis not present

## 2023-07-06 ENCOUNTER — Emergency Department: Payer: Medicare Other

## 2023-07-06 ENCOUNTER — Other Ambulatory Visit: Payer: Self-pay

## 2023-07-06 ENCOUNTER — Observation Stay: Payer: Medicare Other

## 2023-07-06 ENCOUNTER — Encounter: Payer: Self-pay | Admitting: Emergency Medicine

## 2023-07-06 ENCOUNTER — Inpatient Hospital Stay
Admission: EM | Admit: 2023-07-06 | Discharge: 2023-07-12 | DRG: 871 | Disposition: A | Discharge status: Home Health | Payer: Medicare Other | Attending: Internal Medicine | Admitting: Internal Medicine

## 2023-07-06 DIAGNOSIS — Z885 Allergy status to narcotic agent status: Secondary | ICD-10-CM

## 2023-07-06 DIAGNOSIS — B020 Zoster encephalitis: Principal | ICD-10-CM | POA: Diagnosis present

## 2023-07-06 DIAGNOSIS — Z7984 Long term (current) use of oral hypoglycemic drugs: Secondary | ICD-10-CM | POA: Diagnosis not present

## 2023-07-06 DIAGNOSIS — Z833 Family history of diabetes mellitus: Secondary | ICD-10-CM

## 2023-07-06 DIAGNOSIS — K219 Gastro-esophageal reflux disease without esophagitis: Secondary | ICD-10-CM | POA: Diagnosis present

## 2023-07-06 DIAGNOSIS — I10 Essential (primary) hypertension: Secondary | ICD-10-CM | POA: Diagnosis present

## 2023-07-06 DIAGNOSIS — R29898 Other symptoms and signs involving the musculoskeletal system: Secondary | ICD-10-CM | POA: Diagnosis not present

## 2023-07-06 DIAGNOSIS — G0491 Myelitis, unspecified: Secondary | ICD-10-CM | POA: Diagnosis not present

## 2023-07-06 DIAGNOSIS — Z91048 Other nonmedicinal substance allergy status: Secondary | ICD-10-CM | POA: Diagnosis not present

## 2023-07-06 DIAGNOSIS — E872 Acidosis, unspecified: Secondary | ICD-10-CM

## 2023-07-06 DIAGNOSIS — R410 Disorientation, unspecified: Secondary | ICD-10-CM | POA: Diagnosis not present

## 2023-07-06 DIAGNOSIS — G992 Myelopathy in diseases classified elsewhere: Secondary | ICD-10-CM | POA: Diagnosis present

## 2023-07-06 DIAGNOSIS — Z79899 Other long term (current) drug therapy: Secondary | ICD-10-CM | POA: Diagnosis not present

## 2023-07-06 DIAGNOSIS — G2581 Restless legs syndrome: Secondary | ICD-10-CM | POA: Diagnosis present

## 2023-07-06 DIAGNOSIS — G0490 Encephalitis and encephalomyelitis, unspecified: Secondary | ICD-10-CM | POA: Diagnosis not present

## 2023-07-06 DIAGNOSIS — Z471 Aftercare following joint replacement surgery: Secondary | ICD-10-CM | POA: Diagnosis not present

## 2023-07-06 DIAGNOSIS — R531 Weakness: Secondary | ICD-10-CM | POA: Diagnosis not present

## 2023-07-06 DIAGNOSIS — G9341 Metabolic encephalopathy: Secondary | ICD-10-CM | POA: Diagnosis present

## 2023-07-06 DIAGNOSIS — A4189 Other specified sepsis: Principal | ICD-10-CM | POA: Diagnosis present

## 2023-07-06 DIAGNOSIS — G959 Disease of spinal cord, unspecified: Secondary | ICD-10-CM | POA: Diagnosis not present

## 2023-07-06 DIAGNOSIS — Z87891 Personal history of nicotine dependence: Secondary | ICD-10-CM | POA: Diagnosis not present

## 2023-07-06 DIAGNOSIS — Z853 Personal history of malignant neoplasm of breast: Secondary | ICD-10-CM

## 2023-07-06 DIAGNOSIS — E1151 Type 2 diabetes mellitus with diabetic peripheral angiopathy without gangrene: Secondary | ICD-10-CM | POA: Diagnosis present

## 2023-07-06 DIAGNOSIS — Z923 Personal history of irradiation: Secondary | ICD-10-CM

## 2023-07-06 DIAGNOSIS — E1142 Type 2 diabetes mellitus with diabetic polyneuropathy: Secondary | ICD-10-CM | POA: Diagnosis present

## 2023-07-06 DIAGNOSIS — B021 Zoster meningitis: Secondary | ICD-10-CM

## 2023-07-06 DIAGNOSIS — M4802 Spinal stenosis, cervical region: Secondary | ICD-10-CM | POA: Diagnosis not present

## 2023-07-06 DIAGNOSIS — E785 Hyperlipidemia, unspecified: Secondary | ICD-10-CM | POA: Diagnosis present

## 2023-07-06 DIAGNOSIS — Z880 Allergy status to penicillin: Secondary | ICD-10-CM

## 2023-07-06 DIAGNOSIS — M50322 Other cervical disc degeneration at C5-C6 level: Secondary | ICD-10-CM | POA: Diagnosis not present

## 2023-07-06 DIAGNOSIS — R4182 Altered mental status, unspecified: Principal | ICD-10-CM

## 2023-07-06 DIAGNOSIS — E874 Mixed disorder of acid-base balance: Secondary | ICD-10-CM | POA: Diagnosis present

## 2023-07-06 DIAGNOSIS — E876 Hypokalemia: Secondary | ICD-10-CM | POA: Diagnosis not present

## 2023-07-06 DIAGNOSIS — R000 Tachycardia, unspecified: Secondary | ICD-10-CM | POA: Diagnosis not present

## 2023-07-06 DIAGNOSIS — M50321 Other cervical disc degeneration at C4-C5 level: Secondary | ICD-10-CM | POA: Diagnosis not present

## 2023-07-06 DIAGNOSIS — Z7902 Long term (current) use of antithrombotics/antiplatelets: Secondary | ICD-10-CM

## 2023-07-06 DIAGNOSIS — D729 Disorder of white blood cells, unspecified: Secondary | ICD-10-CM | POA: Diagnosis not present

## 2023-07-06 DIAGNOSIS — Z888 Allergy status to other drugs, medicaments and biological substances status: Secondary | ICD-10-CM

## 2023-07-06 DIAGNOSIS — Z91040 Latex allergy status: Secondary | ICD-10-CM

## 2023-07-06 DIAGNOSIS — M50323 Other cervical disc degeneration at C6-C7 level: Secondary | ICD-10-CM | POA: Diagnosis not present

## 2023-07-06 DIAGNOSIS — Z96653 Presence of artificial knee joint, bilateral: Secondary | ICD-10-CM | POA: Diagnosis present

## 2023-07-06 DIAGNOSIS — G9340 Encephalopathy, unspecified: Secondary | ICD-10-CM | POA: Diagnosis not present

## 2023-07-06 DIAGNOSIS — M47812 Spondylosis without myelopathy or radiculopathy, cervical region: Secondary | ICD-10-CM | POA: Diagnosis not present

## 2023-07-06 LAB — BLOOD GAS, VENOUS
Acid-base deficit: 4.1 mmol/L — ABNORMAL HIGH (ref 0.0–2.0)
Bicarbonate: 20 mmol/L (ref 20.0–28.0)
O2 Saturation: 53.3 %
Patient temperature: 37
pCO2, Ven: 33 mm[Hg] — ABNORMAL LOW (ref 44–60)
pH, Ven: 7.39 (ref 7.25–7.43)
pO2, Ven: 33 mm[Hg] (ref 32–45)

## 2023-07-06 LAB — TSH: TSH: 1.181 u[IU]/mL (ref 0.350–4.500)

## 2023-07-06 LAB — URINE DRUG SCREEN, QUALITATIVE (ARMC ONLY)
Amphetamines, Ur Screen: NOT DETECTED
Barbiturates, Ur Screen: NOT DETECTED
Benzodiazepine, Ur Scrn: NOT DETECTED
Cannabinoid 50 Ng, Ur ~~LOC~~: NOT DETECTED
Cocaine Metabolite,Ur ~~LOC~~: NOT DETECTED
MDMA (Ecstasy)Ur Screen: NOT DETECTED
Methadone Scn, Ur: NOT DETECTED
Opiate, Ur Screen: NOT DETECTED
Phencyclidine (PCP) Ur S: NOT DETECTED
Tricyclic, Ur Screen: NOT DETECTED

## 2023-07-06 LAB — SALICYLATE LEVEL: Salicylate Lvl: <7 mg/dL — ABNORMAL LOW (ref 7.0–30.0)

## 2023-07-06 LAB — PROTIME-INR
INR: 1 (ref 0.8–1.2)
Prothrombin Time: 13.2 s (ref 11.4–15.2)

## 2023-07-06 LAB — COMPREHENSIVE METABOLIC PANEL
ALT: 13 U/L (ref 0–44)
AST: 26 U/L (ref 15–41)
Albumin: 3.9 g/dL (ref 3.5–5.0)
Alkaline Phosphatase: 31 U/L — ABNORMAL LOW (ref 38–126)
Anion gap: 15 (ref 5–15)
BUN: 19 mg/dL (ref 8–23)
CO2: 16 mmol/L — ABNORMAL LOW (ref 22–32)
Calcium: 9.3 mg/dL (ref 8.9–10.3)
Chloride: 108 mmol/L (ref 98–111)
Creatinine, Ser: 0.87 mg/dL (ref 0.44–1.00)
GFR, Estimated: >60 mL/min (ref >60)
Glucose, Bld: 164 mg/dL — ABNORMAL HIGH (ref 70–99)
Potassium: 3 mmol/L — ABNORMAL LOW (ref 3.5–5.1)
Sodium: 139 mmol/L (ref 135–145)
Total Bilirubin: 1.2 mg/dL — ABNORMAL HIGH (ref <1.2)
Total Protein: 6.7 g/dL (ref 6.5–8.1)

## 2023-07-06 LAB — URINALYSIS, ROUTINE W REFLEX MICROSCOPIC
Bacteria, UA: NONE SEEN
Bilirubin Urine: NEGATIVE
Glucose, UA: >=500 mg/dL — AB
Ketones, ur: 20 mg/dL — AB
Leukocytes,Ua: NEGATIVE
Nitrite: NEGATIVE
Protein, ur: 30 mg/dL — AB
Specific Gravity, Urine: 1.01 (ref 1.005–1.030)
pH: 6 (ref 5.0–8.0)

## 2023-07-06 LAB — ETHANOL: Alcohol, Ethyl (B): <10 mg/dL (ref <10)

## 2023-07-06 LAB — CBC
HCT: 34.3 % — ABNORMAL LOW (ref 36.0–46.0)
Hemoglobin: 11.9 g/dL — ABNORMAL LOW (ref 12.0–15.0)
MCH: 33 pg (ref 26.0–34.0)
MCHC: 34.7 g/dL (ref 30.0–36.0)
MCV: 95 fL (ref 80.0–100.0)
Platelets: 196 10*3/uL (ref 150–400)
RBC: 3.61 MIL/uL — ABNORMAL LOW (ref 3.87–5.11)
RDW: 13.6 % (ref 11.5–15.5)
WBC: 5.4 10*3/uL (ref 4.0–10.5)
nRBC: 0 % (ref 0.0–0.2)

## 2023-07-06 LAB — ACETAMINOPHEN LEVEL: Acetaminophen (Tylenol), Serum: <10 ug/mL — ABNORMAL LOW (ref 10–30)

## 2023-07-06 LAB — TROPONIN I (HIGH SENSITIVITY): Troponin I (High Sensitivity): 11 ng/L (ref <18)

## 2023-07-06 LAB — PROTEIN AND GLUCOSE, CSF
Glucose, CSF: 77 mg/dL — ABNORMAL HIGH (ref 40–70)
Total  Protein, CSF: 49 mg/dL — ABNORMAL HIGH (ref 15–45)

## 2023-07-06 LAB — CBG MONITORING, ED
Glucose-Capillary: 139 mg/dL — ABNORMAL HIGH (ref 70–99)
Glucose-Capillary: 104 mg/dL — ABNORMAL HIGH (ref 70–99)

## 2023-07-06 LAB — AMMONIA: Ammonia: 10 umol/L (ref 9–35)

## 2023-07-06 LAB — PROCALCITONIN: Procalcitonin: <0.1 ng/mL

## 2023-07-06 LAB — HEMOGLOBIN A1C
Hgb A1c MFr Bld: 6.1 % — ABNORMAL HIGH (ref 4.8–5.6)
Mean Plasma Glucose: 128.37 mg/dL

## 2023-07-06 LAB — MAGNESIUM: Magnesium: 1.8 mg/dL (ref 1.7–2.4)

## 2023-07-06 LAB — LACTIC ACID, PLASMA: Lactic Acid, Venous: 1.6 mmol/L (ref 0.5–1.9)

## 2023-07-06 MED ORDER — ONDANSETRON HCL 4 MG PO TABS: 4.0000 mg | ORAL_TABLET | Freq: Four times a day (QID) | ORAL | Status: DC | PRN

## 2023-07-06 MED ORDER — ENOXAPARIN SODIUM 40 MG/0.4ML IJ SOSY
40.0000 mg | PREFILLED_SYRINGE | INTRAMUSCULAR | Status: DC
  Administered 2023-07-06 – 2023-07-11 (×6): 40 mg via SUBCUTANEOUS
  Filled 2023-07-06 (×6): qty 0.4

## 2023-07-06 MED ORDER — SODIUM CHLORIDE 0.9 % IV BOLUS (SEPSIS)
1000.0000 mL | Freq: Once | INTRAVENOUS | Status: AC
  Administered 2023-07-06: 1000 mL via INTRAVENOUS

## 2023-07-06 MED ORDER — POTASSIUM CHLORIDE 10 MEQ/100ML IV SOLN
10.0000 meq | INTRAVENOUS | Status: AC
  Administered 2023-07-06 (×2): 10 meq via INTRAVENOUS
  Filled 2023-07-06: qty 100

## 2023-07-06 MED ORDER — GABAPENTIN 300 MG PO CAPS
1600.0000 mg | ORAL_CAPSULE | Freq: Every day | ORAL | Status: DC
  Administered 2023-07-07 – 2023-07-11 (×5): 1600 mg via ORAL
  Filled 2023-07-06 (×6): qty 1

## 2023-07-06 MED ORDER — INSULIN ASPART 100 UNIT/ML IJ SOLN
0.0000 [IU] | Freq: Three times a day (TID) | INTRAMUSCULAR | Status: DC
  Administered 2023-07-06 – 2023-07-07 (×2): 2 [IU] via SUBCUTANEOUS
  Administered 2023-07-09 – 2023-07-11 (×4): 3 [IU] via SUBCUTANEOUS
  Filled 2023-07-06 (×6): qty 1

## 2023-07-06 MED ORDER — HYDROMORPHONE HCL 1 MG/ML IJ SOLN
0.5000 mg | INTRAMUSCULAR | Status: DC | PRN
  Administered 2023-07-06 – 2023-07-11 (×6): 0.5 mg via INTRAVENOUS
  Filled 2023-07-06 (×6): qty 0.5

## 2023-07-06 MED ORDER — HALOPERIDOL LACTATE 5 MG/ML IJ SOLN
5.0000 mg | Freq: Four times a day (QID) | INTRAMUSCULAR | Status: DC | PRN
  Administered 2023-07-06 – 2023-07-07 (×2): 5 mg via INTRAVENOUS
  Filled 2023-07-06 (×2): qty 1

## 2023-07-06 MED ORDER — LIDOCAINE 1 % OPTIME INJ - NO CHARGE
5.0000 mL | Freq: Once | INTRAMUSCULAR | Status: DC
  Filled 2023-07-06: qty 6

## 2023-07-06 MED ORDER — ONDANSETRON HCL 4 MG/2ML IJ SOLN: 4.0000 mg | Freq: Four times a day (QID) | INTRAMUSCULAR | Status: DC | PRN

## 2023-07-06 MED ORDER — VANCOMYCIN HCL 500 MG/100ML IV SOLN
500.0000 mg | Freq: Two times a day (BID) | INTRAVENOUS | Status: DC
  Administered 2023-07-07: 500 mg via INTRAVENOUS
  Filled 2023-07-06 (×2): qty 100

## 2023-07-06 MED ORDER — FLUTICASONE PROPIONATE 50 MCG/ACT NA SUSP: 2.0000 | NASAL | Status: DC | PRN

## 2023-07-06 MED ORDER — LORAZEPAM 2 MG/ML IJ SOLN
1.0000 mg | Freq: Once | INTRAMUSCULAR | Status: AC
Start: 2023-07-06 — End: 2023-07-06
  Administered 2023-07-06: 1 mg via INTRAVENOUS
  Filled 2023-07-06: qty 1

## 2023-07-06 MED ORDER — SODIUM CHLORIDE 0.9 % IV SOLN: INTRAVENOUS | Status: AC

## 2023-07-06 MED ORDER — VANCOMYCIN HCL 1500 MG/300ML IV SOLN
1500.0000 mg | Freq: Once | INTRAVENOUS | Status: DC
  Filled 2023-07-06: qty 300

## 2023-07-06 MED ORDER — LORAZEPAM 2 MG/ML IJ SOLN
1.0000 mg | Freq: Four times a day (QID) | INTRAMUSCULAR | Status: AC | PRN
Start: 2023-07-06 — End: 2023-07-06
  Administered 2023-07-06 (×2): 1 mg via INTRAVENOUS
  Filled 2023-07-06 (×2): qty 1

## 2023-07-06 MED ORDER — TRAMADOL HCL 50 MG PO TABS: 50.0000 mg | ORAL_TABLET | Freq: Two times a day (BID) | ORAL | Status: DC | PRN

## 2023-07-06 MED ORDER — LIDOCAINE 5 % EX OINT
1.0000 | TOPICAL_OINTMENT | Freq: Two times a day (BID) | CUTANEOUS | Status: DC | PRN
  Administered 2023-07-09: 1 via TOPICAL
  Filled 2023-07-06: qty 35.44

## 2023-07-06 MED ORDER — VANCOMYCIN HCL IN DEXTROSE 1-5 GM/200ML-% IV SOLN: 1000.0000 mg | INTRAVENOUS | Status: DC

## 2023-07-06 MED ORDER — LORAZEPAM 2 MG/ML IJ SOLN
1.0000 mg | Freq: Once | INTRAMUSCULAR | Status: AC
  Administered 2023-07-06: 1 mg via INTRAVENOUS
  Filled 2023-07-06: qty 1

## 2023-07-06 MED ORDER — ESCITALOPRAM OXALATE 10 MG PO TABS
20.0000 mg | ORAL_TABLET | Freq: Every day | ORAL | Status: DC
  Administered 2023-07-07 – 2023-07-12 (×6): 20 mg via ORAL
  Filled 2023-07-06 (×6): qty 2

## 2023-07-06 MED ORDER — PANTOPRAZOLE SODIUM 40 MG PO TBEC
40.0000 mg | DELAYED_RELEASE_TABLET | Freq: Every day | ORAL | Status: DC
  Administered 2023-07-07 – 2023-07-12 (×6): 40 mg via ORAL
  Filled 2023-07-06 (×6): qty 1

## 2023-07-06 MED ORDER — LORAZEPAM 2 MG/ML PO CONC
1.0000 mg | Freq: Once | ORAL | Status: AC
  Administered 2023-07-06: 1 mg via ORAL
  Filled 2023-07-06: qty 0.5

## 2023-07-06 MED ORDER — SODIUM CHLORIDE 0.9 % IV SOLN
2.0000 g | Freq: Three times a day (TID) | INTRAVENOUS | Status: DC
  Administered 2023-07-06 – 2023-07-07 (×2): 2 g via INTRAVENOUS
  Filled 2023-07-06 (×3): qty 40

## 2023-07-06 MED ORDER — CLOPIDOGREL BISULFATE 75 MG PO TABS: 75.0000 mg | ORAL_TABLET | Freq: Every day | ORAL | Status: DC

## 2023-07-06 MED ORDER — MECLIZINE HCL 25 MG PO TABS: 12.5000 mg | ORAL_TABLET | Freq: Three times a day (TID) | ORAL | Status: DC | PRN

## 2023-07-06 MED ORDER — GABAPENTIN 300 MG PO CAPS
800.0000 mg | ORAL_CAPSULE | Freq: Two times a day (BID) | ORAL | Status: DC
  Administered 2023-07-07 – 2023-07-12 (×11): 800 mg via ORAL
  Filled 2023-07-06 (×11): qty 2

## 2023-07-06 MED ORDER — VANCOMYCIN HCL 1750 MG/350ML IV SOLN
1750.0000 mg | Freq: Once | INTRAVENOUS | Status: AC
  Administered 2023-07-06: 1750 mg via INTRAVENOUS
  Filled 2023-07-06: qty 350

## 2023-07-06 MED ORDER — GABAPENTIN 800 MG PO TABS: 800.0000 mg | ORAL_TABLET | Freq: Three times a day (TID) | ORAL | Status: DC

## 2023-07-06 MED ORDER — ACETAMINOPHEN 650 MG RE SUPP
325.0000 mg | RECTAL | Status: DC | PRN
  Administered 2023-07-06: 325 mg via RECTAL
  Filled 2023-07-06: qty 1

## 2023-07-06 MED ORDER — LOSARTAN POTASSIUM 25 MG PO TABS
25.0000 mg | ORAL_TABLET | Freq: Every day | ORAL | Status: DC
  Administered 2023-07-07 – 2023-07-12 (×6): 25 mg via ORAL
  Filled 2023-07-06 (×6): qty 1

## 2023-07-06 MED ORDER — ROSUVASTATIN CALCIUM 20 MG PO TABS
20.0000 mg | ORAL_TABLET | Freq: Every day | ORAL | Status: DC
  Administered 2023-07-07 – 2023-07-12 (×6): 20 mg via ORAL
  Filled 2023-07-06 (×6): qty 1

## 2023-07-06 MED ORDER — DEXTROSE 5 % IV SOLN
660.0000 mg | Freq: Three times a day (TID) | INTRAVENOUS | Status: DC
  Administered 2023-07-06 – 2023-07-07 (×3): 660 mg via INTRAVENOUS
  Filled 2023-07-06: qty 13.2
  Filled 2023-07-06: qty 6.8
  Filled 2023-07-06: qty 13.2

## 2023-07-06 MED ORDER — OLANZAPINE 10 MG IM SOLR
5.0000 mg | INTRAMUSCULAR | Status: DC | PRN
  Administered 2023-07-06: 5 mg via INTRAMUSCULAR
  Filled 2023-07-06: qty 10

## 2023-07-06 NOTE — H&P (Signed)
History and Physical    Theresa Ferguson XBM:[id number] DOB: 1946/06/26 DOA: 07/06/2023  PCP: Ollen Bowl, MD (Confirm with patient/family/NH records and if not entered, this has to be entered at [facility] point of entry) Patient coming from: Home I have personally briefly reviewed patient's old medical records in [facility]  Chief Complaint: AMS  HPI: Theresa Ferguson is a 77 y.o. female with medical history significant of intermittent vertigo, HTN, HLD, right breast cancer status post lumpectomy and radioactive seed treatment, PVD on Plavix, IIDM, chronic peripheral neuropathy on gabapentin, brought in by family member for evaluation of worsening of mentations.  Patient is confused and agitated, unable to give any history, or history provided by husband at bedside.  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  This time, about 3-4 weeks ago, patient started to have vertigo for 2 days, then patient started develop shingles, started on left 2 fingers and the palm then extended to forearm done upper arm and left shoulder with severe pain.  And about 3 days later, the shingles lesion cross stated and the patient started to feel better however then she developed another episode of vertigo's for 2 to 3 days with severe spinning sensations.  She came to ED 10 days ago, MRI was done which showed no stroke and patient sent home.  Later the spinning sensation also subsided.    About 7 days ago, however the left arm pain became more severe, and she went to see PCP, and was given muscle relaxant and tramadol.  Patient completed muscle relaxant last Friday and only continue tramadol twice daily and last dose was yesterday evening.  Same day, patient started to feel nausea but no vomiting and the last 2-3 days patient has had very little p.o. intake.  According to husband patient denied any  vomiting, no diarrhea.  And yesterday patient became intermittently confused, and overnight patient became agitated, which prompted husband to call EMS.  Husband reported that patient was not given gabapentin since Sunday.  ED Course: Afebrile, tachycardia blood pressure stable nonhypoxic.  CT head negative for acute findings.  Blood work showed K3.0, bicarb 16, creatinine 0.8, VBG 7.39/33/33.  Patient was given IV bolus x 1 in the ED.  Review of Systems: Unable to perform, patient is confused.  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   Complication of anesthesia    CTS (carpal tunnel syndrome)    bil, by [facility]   GERD (gastroesophageal reflux disease)    Hyperlipemia    Hypertension    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   Personal history of radiation therapy    PONV (postoperative nausea and vomiting)    Radiculopathy of lumbosacral region 02/02/2013   RLS (restless legs syndrome) 02/05/2015   Seasonal allergies     Past Surgical History:  Procedure Laterality Date   BREAST LUMPECTOMY Right 08/07/2020   right lumpectomy w/ radiation   BREAST LUMPECTOMY WITH RADIOACTIVE SEED LOCALIZATION Right 08/07/2020   Procedure: RIGHT BREAST LUMPECTOMY WITH RADIOACTIVE SEED LOCALIZATION;  Surgeon: Emelia Loron, MD;  Location: [facility];  Service: General;  Laterality: Right;   FINGER SURGERY Right 08/31/1996   [facility]   kidney stones  08/31/1978   KNEE SURGERY Bilateral  partial right-2008,left full replacement-2006-Dr [facility]   LAMINECTOMY     l4-5   LOWER EXTREMITY ANGIOGRAPHY Left 12/29/2022   Procedure: Lower Extremity Angiography;  Surgeon: Renford Dills, MD;  Location: [facility];  Service: Cardiovascular;  Laterality: Left;   LUMBAR DISC SURGERY       reports that she quit smoking about 42 years ago. Her smoking use included cigarettes. She has never  used smokeless tobacco. She reports current alcohol use. She reports that she does not use drugs.  Allergies  Allergen Reactions   Codeine    Latex    Morphine And Codeine    Nickel    Penicillins    Atorvastatin Other (See Comments)   Cerivastatin Other (See Comments)   Fluvastatin Other (See Comments)    Family History  Problem Relation Age of Onset   Diabetes Son    Breast cancer Neg Hx      Prior to Admission medications   Medication Sig Start Date End Date Taking? Authorizing Provider  metFORMIN (GLUCOPHAGE-XR) 500 MG 24 hr tablet Take 1,000 mg by mouth 2 (two) times daily. 06/30/23  Yes [provider]  traMADol (ULTRAM) 50 MG tablet Take 50 mg by mouth.  1 tablet as needed Orally up to twice a day for shingles pain 07/02/23 07/16/23 Yes [provider]  clopidogrel (PLAVIX) 75 MG tablet Take 1 tablet (75 mg total) by mouth daily. 12/30/22   Schnier, Latina Craver, MD  escitalopram (LEXAPRO) 20 MG tablet TAKE 1 TABLET BY MOUTH ONCE DAILY 05/23/21   Mauro Kaufmann, NP  FARXIGA 10 MG TABS tablet Take 10 mg by mouth daily. 12/04/22   [provider]  ferrous sulfate 325 (65 FE) MG tablet Take 325 mg by mouth daily with breakfast.    [provider]  fluticasone (FLONASE ALLERGY RELIEF) 50 MCG/ACT nasal spray Place 2 sprays into both nostrils as needed.    [provider]  gabapentin (NEURONTIN) 800 MG tablet Take one by mouth at morning . Lunch and 2 at night. 05/24/23   Lomax, Amy, NP  lidocaine (XYLOCAINE) 5 % ointment Apply 1 Application topically 2 (two) times daily as needed. 06/19/23   Raspet, Noberto Retort, PA-C  losartan (COZAAR) 25 MG tablet Take 25 mg by mouth daily.    [provider]  Magnesium 400 MG TABS 1 tablet Orally once a day    [provider]  meclizine (ANTIVERT) 12.5 MG tablet Take 1 tablet (12.5 mg total) by mouth 3 (three) times daily as needed for up to 14 days for dizziness. 06/24/23 07/08/23  Pilar Jarvis,  MD  metaxalone [facility]) 800 MG tablet Take by mouth. 06/17/23   [provider]  metFORMIN (GLUCOPHAGE) 500 MG tablet Take 1,000 mg by mouth 2 (two) times daily. At bedtime    [provider]  pantoprazole (PROTONIX) 40 MG tablet Take 40 mg by mouth daily.    [provider]  rosuvastatin (CRESTOR) 10 MG tablet Take 10 mg by mouth daily.    [provider]  rosuvastatin (CRESTOR) 20 MG tablet Take 1 tablet by mouth daily.    [provider]  vitamin B-12 (CYANOCOBALAMIN) 1000 MCG tablet Take 1,000 mcg by mouth daily.    [provider]    Physical Exam: Vitals:   07/06/23 0332 07/06/23 0851 07/06/23 0930 07/06/23 0930  BP: 120/78  (!) 179/93   Pulse: (!) 102  (!) 106   Resp: 16  (!) 22  Temp: 97.7 F (36.5 C)   (!) 100.4 F (38 C)  TempSrc: Oral   Axillary  SpO2: 100% 100% 100%   Weight:      Height:        Constitutional: NAD, calm, comfortable Vitals:   07/06/23 0332 07/06/23 0851 07/06/23 0930 07/06/23 0930  BP: 120/78  (!) 179/93   Pulse: (!) 102  (!) 106   Resp: 16  (!) 22   Temp: 97.7 F (36.5 C)   (!) 100.4 F (38 C)  TempSrc: Oral   Axillary  SpO2: 100% 100% 100%   Weight:      Height:       Eyes: PERRL, lids and conjunctivae normal ENMT: Mucous membranes are moist. Posterior pharynx clear of any exudate or lesions.Normal dentition.  Neck: normal, supple, no masses, no thyromegaly Respiratory: clear to auscultation bilaterally, no wheezing, no crackles. Normal respiratory effort. No accessory muscle use.  Cardiovascular: Regular rate and rhythm, no murmurs / rubs / gallops. No extremity edema. 2+ pedal pulses. No carotid bruits.  Abdomen: no tenderness, no masses palpated. No hepatosplenomegaly. Bowel sounds positive.  Musculoskeletal: no clubbing / cyanosis. No joint deformity upper and lower extremities. Good ROM, no contractures. Normal muscle tone.  Skin: Crusted small lesion on the left  palm Neurologic: Patient appears to be restless. Awake.  No facial droops, moving all limbs, not following commands.  Muscle rigidity normal. Psychiatric: Awake, confused   Labs on Admission: I have personally reviewed following labs and imaging studies  CBC: Recent Labs  Lab 07/06/23 0346  WBC 5.4  HGB 11.9*  HCT 34.3*  MCV 95.0  PLT 196   Basic Metabolic Panel: Recent Labs  Lab 07/06/23 0536  NA 139  K 3.0*  CL 108  CO2 16*  GLUCOSE 164*  BUN 19  CREATININE 0.87  CALCIUM 9.3   GFR: Estimated Creatinine Clearance: 49.5 mL/min (by C-G formula based on SCr of 0.87 mg/dL). Liver Function Tests: Recent Labs  Lab 07/06/23 0536  AST 26  ALT 13  ALKPHOS 31*  BILITOT 1.2*  PROT 6.7  ALBUMIN 3.9   No results for input(s): "LIPASE", "AMYLASE" in the last 168 hours. Recent Labs  Lab 07/06/23 0421  AMMONIA 10   Coagulation Profile: Recent Labs  Lab 07/06/23 0421  INR 1.0   Cardiac Enzymes: No results for input(s): "CKTOTAL", "CKMB", "CKMBINDEX", "TROPONINI" in the last 168 hours. BNP (last 3 results) No results for input(s): "PROBNP" in the last 8760 hours. HbA1C: No results for input(s): "HGBA1C" in the last 72 hours. CBG: No results for input(s): "GLUCAP" in the last 168 hours. Lipid Profile: No results for input(s): "CHOL", "HDL", "LDLCALC", "TRIG", "CHOLHDL", "LDLDIRECT" in the last 72 hours. Thyroid Function Tests: Recent Labs    07/06/23 0536  TSH 1.181   Anemia Panel: No results for input(s): "VITAMINB12", "FOLATE", "FERRITIN", "TIBC", "IRON", "RETICCTPCT" in the last 72 hours. Urine analysis:    Component Value Date/Time   COLORURINE YELLOW (A) 07/06/2023 0528   APPEARANCEUR CLEAR (A) 07/06/2023 0528   LABSPEC 1.010 07/06/2023 0528   PHURINE 6.0 07/06/2023 0528   GLUCOSEU >=500 (A) 07/06/2023 0528   HGBUR SMALL (A) 07/06/2023 0528   BILIRUBINUR NEGATIVE 07/06/2023 0528   KETONESUR 20 (A) 07/06/2023 0528   PROTEINUR 30 (A) 07/06/2023  0528   NITRITE NEGATIVE 07/06/2023 0528   LEUKOCYTESUR NEGATIVE 07/06/2023 0528    Radiological Exams on Admission: CT HEAD WO CONTRAST ( )  Result Date: 07/06/2023 CLINICAL DATA:  77 year old  female with combative, altered mental status. History of hypertension. EXAM: CT HEAD WITHOUT CONTRAST TECHNIQUE: Contiguous axial images were obtained from the base of the skull through the vertex without intravenous contrast. RADIATION DOSE REDUCTION: This exam was performed according to the departmental dose-optimization program which includes automated exposure control, adjustment of the mA and/or kV according to patient size and/or use of iterative reconstruction technique. COMPARISON:  Temporal bone CT 12/19/2019.  Brain MRI 06/24/2023. FINDINGS: Brain: Initial motion degraded sequence series 4 from 0646 hours had to be repeated. Diagnostic images obtained at 0752 hours. Stable cerebral volume from the MRI last month. No midline shift, mass effect, or evidence of intracranial mass lesion. No ventriculomegaly. Patchy and confluent bilateral cerebral white matter hypodensity appears stable from the MRI findings. Stable deep gray matter heterogeneity. No acute or chronic cortically based infarct identified. No acute intracranial hemorrhage identified. Vascular: Extensive Calcified atherosclerosis at the skull base. No suspicious intracranial vascular hyperdensity. Skull: No acute osseous abnormality identified. Sinuses/Orbits: Visualized paranasal sinuses and mastoids are stable and well aerated. Other: No acute orbit or scalp soft tissue finding. IMPRESSION: 1. No acute intracranial abnormality. 2. Stable non contrast CT appearance of small vessel disease. Electronically Signed   By: Odessa Fleming M.D.   On: 07/06/2023 08:28    EKG: Independently reviewed.  Sinus rhythm, LVH, no acute ST changes.  Assessment/Plan Principal Problem:   AMS (altered mental status)  (please populate well all problems here in Problem  List. (For example, if patient is on BP meds at home and you resume or decide to hold them, it is a problem that needs to be her. Same for CAD, COPD, HLD and so on)  Delirium -Clinically suspect polypharmacy versus gabapentin withdrawal -Case was discussed with on-call neurology, who plans to see the patient this morning. -Consider further image of the brain once patient mentation improves.  And will discuss with neurology about indication for seizure workup. -No significant organic etiology identified so far.  UTI ruled out and ammonium level normal. -Admit to telemetry for close monitoring.  Fall precautions, seizure precaution. -NPO and IVF -Other DDx, given that the shingle on left hand and arm shingle onset was 3+ weeks ago and crusted about 3 weeks ago and patient was afebrile and no meningeal sign, low suspicion for viral encephalitis.   -Resume gabapentin -As needed Zyprexa for agitation  Left arm and hand shingle -No acute findings, crusted lesion on left hand otherwise no signs of active lesions.  Vertigo -Reevaluate after patient mentation improves, continue as needed meclizine  Hypokalemia -IV replacement, recheck level tomorrow  Compensated non-anion gap metabolic acidosis and respiratory alkalosis -Etiology unknown, and the patient had no diarrhea as per family. -IV fluid and other supportive care and recheck BMP tomorrow  IIDM -Hold off metformin, start SSI  PVD -Continue Plavix  HTN -Continue losartan   DVT prophylaxis: Lovenox Code Status: Full code Family Communication: Husband at bedside Disposition Plan: Expect less than 2 midnight hospital stay Consults called: Neurology Admission status: Telemetry observation   Emeline General MD [facility] Pager [phone]  07/06/2023, 9:53 AM

## 2023-07-06 NOTE — ED Notes (Signed)
Previously, pt was crawling out of bed, and ripping off her brief.  She was cleaned, bed changed and pain and ativan given. Both worked well to calm pt and she is resting comfortably at this time

## 2023-07-06 NOTE — ED Notes (Signed)
Pt changed out of wet brief and into new linen. MD notified of axillary temp 101.4

## 2023-07-06 NOTE — ED Triage Notes (Signed)
Patient wheeled to triage with complaints of altered mental status, weakness, and being 'sick'. Reports mostly from husband as patient is very confused, unable to tell this RN where she is, what year it is, or what brought her in tonight other than being 'sick'. Spouse reports over the past couple of weeks she has been dealing with vertigo and shingles and seems to have declined since. He also reports she has not been eating or drinking normally. First noticed the increasing confusion yesterday and stated she stayed in bed all day yesterday as well despite trying to get her up multiple times.

## 2023-07-06 NOTE — Consult Note (Signed)
Pharmacy Note - Acyclovir   Theresa Ferguson is a 77 y.o. female admitted on 07/06/2023 with  confusion and agitation  with fever. Neurology consulted for febrile encephalopathy with no clear indication. LP to rule out CNS infection. CSF had elevated WBC, leukocytes > neutrophils, hazy, glucose 77, protein 49. Pharmacy has been consulted for acyclovir dosing.  Plan: Start acyclovir 660 mg Q8H Monitor renal function: 0.87 Patient's renal function: CrCl 49.5 on edge of cut off for Q12H dosing at < 50.  Normalized = ~ 54 mL/min Started patient on Q8H due to nature of CNS disease Follow that patient remain on fluids while receiving acyclovir  Currently 125 mL/hr continuous 0.9% NS  Height: 5\' 5"  (165.1 cm) Weight: 66.3 kg (146 lb 2.6 oz) IBW/kg (Calculated) : 57  Temp (24hrs), Avg:99.6 F (37.6 C), Min:97.7 F (36.5 C), Max:101.4 F (38.6 C)  Recent Labs  Lab 07/06/23 0346 07/06/23 0421 07/06/23 0536  WBC 5.4  --   --   CREATININE  --   --  0.87  LATICACIDVEN  --  1.6  --     Estimated Creatinine Clearance: 49.5 mL/min (by C-G formula based on SCr of 0.87 mg/dL).    Allergies  Allergen Reactions   Codeine    Latex    Morphine And Codeine    Nickel    Penicillins    Atorvastatin Other (See Comments)   Cerivastatin Other (See Comments)   Fluvastatin Other (See Comments)    Antimicrobials this admission: Acyclovir 11/5 >>   Potential Neurotoxic agents Losartan 25 mg daily   Dose adjustments this admission: NA  Microbiology results: 11/5 meningitis panel: in process 11/5 CSF culture/gram stain: in process  11/5 Bcx: in process 11/5 Fungal: in process  Thank you for allowing pharmacy to be a part of this patient's care.  Effie Shy, PharmD Pharmacy Resident  07/06/2023 5:27 PM

## 2023-07-06 NOTE — ED Notes (Signed)
This RN took pt to CT, unable to obtain scan as pt still moving after 1 mg of Ativan. Verbal order to administer 1 mg of Ativan. Pt tolerated scan after 2nd dose of 1 mg Ativan.

## 2023-07-06 NOTE — Progress Notes (Signed)
OT Cancellation Note  Patient Details Name: Theresa Ferguson MRN: [id number] DOB: 11-28-1945   Cancelled Treatment:    Reason Eval/Treat Not Completed: Patient at procedure or test/ unavailable. Pt unavailable for OT evaluation 22/2 lumbar puncture. Per chart, additional medication also given to help pt prepare for procedure. Will re-attempt OT evaluation next date as medically appropriate.   Arman Filter., MPH, MS, OTR/L ascom [phone] 07/06/23, 3:42 PM

## 2023-07-06 NOTE — Consult Note (Addendum)
NEUROLOGY CONSULT NOTE   Date of service: July 06, 2023 Patient Name: Theresa Ferguson MRN:  [id number] DOB:  [birth year] Chief Complaint: "Confusion" Requesting Provider: Pilar Jarvis, MD  History of Present Illness  Theresa Ferguson is a 77 y.o. 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 who presents with acute encephalopathy.  She was diagnosed with shingles about 3 weeks ago, and was taking tramadol and Tylenol but no antivirals, but then on Sunday began getting confused.  This progressed to the point where yesterday her husband was having trouble keeping her awake, but when he did wake her up, she would answer simple questions.  Due to her continued worsening, he brought her into the emergency department today for further evaluation.    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   Complication of anesthesia    CTS (carpal tunnel syndrome)    bil, by EMG/[place]   GERD (gastroesophageal reflux disease)    Hyperlipemia    Hypertension    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   Personal history of radiation therapy    PONV (postoperative nausea and vomiting)    Radiculopathy of lumbosacral region 02/02/2013   RLS (restless legs syndrome) 02/05/2015   Seasonal allergies     Past Surgical History:  Procedure Laterality Date   BREAST LUMPECTOMY Right 08/07/2020   right lumpectomy w/ radiation   BREAST LUMPECTOMY WITH RADIOACTIVE SEED  LOCALIZATION Right 08/07/2020   Procedure: RIGHT BREAST LUMPECTOMY WITH RADIOACTIVE SEED LOCALIZATION;  Surgeon: Emelia Loron, MD;  Location: [facility];  Service: General;  Laterality: Right;   FINGER SURGERY Right 08/31/1996   [facility]   kidney stones  08/31/1978   KNEE SURGERY Bilateral    partial right-2008,left full replacement-2006-Dr [facility]   LAMINECTOMY     l4-5   LOWER EXTREMITY ANGIOGRAPHY Left 12/29/2022   Procedure: Lower Extremity Angiography;  Surgeon: Renford Dills, MD;  Location: [facility];  Service: Cardiovascular;  Laterality: Left;   LUMBAR DISC SURGERY      Family History: Family History  Problem Relation Age of Onset   Diabetes Son    Breast cancer Neg Hx     Social History  reports that she quit smoking about 42 years ago. Her smoking use included cigarettes. She has never used smokeless tobacco. She reports current alcohol use. She reports that she does not use drugs.  Allergies  Allergen Reactions   Codeine    Latex    Morphine And Codeine    Nickel    Penicillins    Atorvastatin Other (See Comments)   Cerivastatin Other (See Comments)   Fluvastatin Other (See Comments)    Medications   Current Facility-Administered Medications:    0.9 %  sodium chloride infusion, , Intravenous, Continuous, Zhang, Ilda Foil T, MD   OLANZapine (ZYPREXA) injection 5 mg, 5 mg, Intramuscular, Q4H PRN, Mikey College T, MD   potassium chloride 10 mEq in 100 mL IVPB, 10 mEq, Intravenous, Q1 Hr x 2, Ward, Layla Maw, DO,  Last Rate: 100 mL/hr at 07/06/23 0812, 10 mEq at 07/06/23 2841  Current Outpatient Medications:    metFORMIN (GLUCOPHAGE-XR) 500 MG 24 hr tablet, Take 1,000 mg by mouth 2 (two) times daily., Disp: , Rfl:    traMADol (ULTRAM) 50 MG tablet, Take 50 mg by mouth.  1 tablet as needed Orally up to twice a day for shingles pain, Disp: , Rfl:    clopidogrel (PLAVIX) 75 MG tablet, Take 1 tablet (75 mg total) by mouth daily.,  Disp: 30 tablet, Rfl: 11   escitalopram (LEXAPRO) 20 MG tablet, TAKE 1 TABLET BY MOUTH ONCE DAILY, Disp: 90 tablet, Rfl: 0   FARXIGA 10 MG TABS tablet, Take 10 mg by mouth daily., Disp: , Rfl:    ferrous sulfate 325 (65 FE) MG tablet, Take 325 mg by mouth daily with breakfast., Disp: , Rfl:    fluticasone (FLONASE ALLERGY RELIEF) 50 MCG/ACT nasal spray, Place 2 sprays into both nostrils as needed., Disp: , Rfl:    gabapentin (NEURONTIN) 800 MG tablet, Take one by mouth at morning . Lunch and 2 at night., Disp: 360 tablet, Rfl: 1   lidocaine (XYLOCAINE) 5 % ointment, Apply 1 Application topically 2 (two) times daily as needed., Disp: 50 g, Rfl: 0   losartan (COZAAR) 25 MG tablet, Take 25 mg by mouth daily., Disp: , Rfl:    Magnesium 400 MG TABS, 1 tablet Orally once a day, Disp: , Rfl:    meclizine (ANTIVERT) 12.5 MG tablet, Take 1 tablet (12.5 mg total) by mouth 3 (three) times daily as needed for up to 14 days for dizziness., Disp: 30 tablet, Rfl: 0   metaxalone (SKELAXIN) 800 MG tablet, Take by mouth., Disp: , Rfl:    metFORMIN (GLUCOPHAGE) 500 MG tablet, Take 1,000 mg by mouth 2 (two) times daily. At bedtime, Disp: , Rfl:    pantoprazole (PROTONIX) 40 MG tablet, Take 40 mg by mouth daily., Disp: , Rfl:    rosuvastatin (CRESTOR) 10 MG tablet, Take 10 mg by mouth daily., Disp: , Rfl:    rosuvastatin (CRESTOR) 20 MG tablet, Take 1 tablet by mouth daily., Disp: , Rfl:    vitamin B-12 (CYANOCOBALAMIN) 1000 MCG tablet, Take 1,000 mcg by mouth daily., Disp: , Rfl:   Vitals   Vitals:   07/06/23 0331 07/06/23 0332  BP:  120/78  Pulse:  (!) 102  Resp:  16  Temp:  97.7 F (36.5 C)  TempSrc:  Oral  SpO2:  100%  Weight: 66.3 kg   Height: 5\' 5"  (1.651 m)     Body mass index is 24.32 kg/m.  Physical Exam   Constitutional: Appears well-developed and well-nourished.  Psych: She has some psychomotor agitation, frequently moving and fidgeting in the bed.  Neurologic Examination     Neuro: Mental Status: Patient is encephalopathic, with repeated stimulation I am able to get her to stick out her tongue to command, but she does not follow any other commands. Cranial Nerves: II: She does not cooperate and actively resists blink to threat. Pupils are equal, round, and reactive to light.   III,IV, VI: She actively resists me try to check doll's eye maneuver, I do not see her cross midline to the left but unclear if true restriction V: VII: Appears grossly symmetric Motor: She moves all extremities relatively symmetrically  sensory: As above  Plantars: Toes are downgoing bilaterally.  Cerebellar: Does not perform       Labs/Imaging/Neurodiagnostic studies   CBC:  Recent Labs  Lab 07/06/23 0346  WBC 5.4  HGB 11.9*  HCT 34.3*  MCV 95.0  PLT 196    Basic Metabolic Panel:  Lab Results  Component Value Date   NA 139 07/06/2023   K 3.0 (L) 07/06/2023   CO2 16 (L) 07/06/2023   GLUCOSE 164 (H) 07/06/2023   BUN 19 07/06/2023   CREATININE 0.87 07/06/2023   CALCIUM 9.3 07/06/2023   GFRNONAA >60 07/06/2023    Lipid Panel: No results found for: "LDLCALC"  HgbA1c: No results found for: "HGBA1C"  Urine Drug Screen:     Component Value Date/Time   LABOPIA NONE DETECTED 07/06/2023 0528   COCAINSCRNUR NONE DETECTED 07/06/2023 0528   LABBENZ NONE DETECTED 07/06/2023 0528   AMPHETMU NONE DETECTED 07/06/2023 0528   THCU NONE DETECTED 07/06/2023 0528   LABBARB NONE DETECTED 07/06/2023 0528     Alcohol Level     Component Value Date/Time   ETH <10 07/06/2023 0536    INR  Lab Results  Component Value Date   INR 1.0 07/06/2023    TSH 1.18 Ammonia is 10  CT Head without contrast(Personally reviewed): Negative  Impression   Theresa Ferguson is a 77 y.o. female who presents with progressive encephalopathy coupled with fever in the setting of recent shingles infection.  Given febrile encephalopathy with no other clear cause, CNS infection  absolutely must be ruled out and she is going for a lumbar puncture shortly.  If there is any pleocytosis at all, I would favor broad-spectrum coverage until cultures have returned.  Recommendations  Acyclovir, ampicillin, vancomycin, Rocephin if any pleocytosis on LP or any delay in getting it Meningitis/encephalitis panel as well as cultures and Gram stain on CSF EEG MRI Discontinue tramadol and meclizine Neurology will continue to follow ______________________________________________________________________    Signed,  Ritta Slot [facility]

## 2023-07-06 NOTE — ED Notes (Signed)
Patient transported to MRI 

## 2023-07-06 NOTE — ED Notes (Signed)
Chipper Herb, MD, made aware of critical results on spinal fluid.  Tube 3, white count, 42 Tube 1, white count, 92

## 2023-07-06 NOTE — ED Notes (Signed)
Pt's agitation continues. Weak attempts to sit up in bed saying, "I have to use the bathroom." Turns side to side. Pt encouraged to use the bathroom in her brief as unsafe to ambulate at this time. Pt unable to void with encouragement at this time. MD notified of this and axillary temp of 100.4 F

## 2023-07-06 NOTE — ED Provider Notes (Signed)
[facility] Provider Note    Event Date/Time   First MD Initiated Contact with Patient 07/06/23 0402     (approximate)   History   Altered Mental Status and Weakness   HPI  Theresa Ferguson is a 77 y.o. female with history of hypertension, hyperlipidemia, diabetes, breast cancer who presents to the emergency department with her husband for concerns for altered mental status that started 2 days ago.  States he last saw her normal on Saturday, November 2.  States her altered mental status was significantly worse yesterday.  He states that she did not want to eat or drink or get out of bed.  She is able to tell me her name but not able to answer any other questions appropriately.  Has been reports she has recently been dealing with shingles to the left upper extremity and has been on Tylenol, tramadol, gabapentin.  He reports 2 days ago he stopped giving her her tramadol because he was concerned this could be causing her symptoms.  He states that she has been taking about 8 to 9 tablets of Tylenol every day.  Also.  She has been recently prescribed Skelaxin but he states she is not taking that.  She has also recently been on meclizine for vertigo.  She had a negative MRI of her brain without contrast on 06/24/2023.  No history of any falls, head injury.  She is on Plavix.  He cannot remember when she had her last dose of Plavix.  No known fevers, cough, vomiting, diarrhea.  States she has not been eating or drinking much in the past day.   History provided by patient's husband, level 5 caveat secondary to patient's altered mental status.    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   Complication of anesthesia    CTS (carpal tunnel syndrome)    bil, by EMG/[place]   GERD (gastroesophageal reflux disease)    Hyperlipemia    Hypertension    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   Personal  history of radiation therapy    PONV (postoperative nausea and vomiting)    Radiculopathy of lumbosacral region 02/02/2013   RLS (restless legs syndrome) 02/05/2015   Seasonal allergies     Past Surgical History:  Procedure Laterality Date   BREAST LUMPECTOMY Right 08/07/2020   right lumpectomy w/ radiation   BREAST LUMPECTOMY WITH RADIOACTIVE SEED LOCALIZATION Right 08/07/2020   Procedure: RIGHT BREAST LUMPECTOMY WITH RADIOACTIVE SEED LOCALIZATION;  Surgeon: Emelia Loron, MD;  Location: [facility];  Service: General;  Laterality: Right;   FINGER SURGERY Right 08/31/1996   [facility]   kidney stones  08/31/1978   KNEE SURGERY Bilateral    partial right-2008,left full replacement-2006-Dr [facility]   LAMINECTOMY     l4-5   LOWER EXTREMITY ANGIOGRAPHY Left 12/29/2022   Procedure: Lower Extremity Angiography;  Surgeon: Renford Dills, MD;  Location: [facility];  Service: Cardiovascular;  Laterality: Left;   LUMBAR DISC SURGERY      MEDICATIONS:  Prior to Admission medications   Medication Sig Start Date End Date Taking? Authorizing Provider  clopidogrel (PLAVIX) 75 MG tablet Take 1 tablet (75 mg total) by mouth daily. 12/30/22   Schnier, Latina Craver, MD  escitalopram (LEXAPRO) 20 MG tablet TAKE 1 TABLET BY MOUTH ONCE DAILY 05/23/21   Mauro Kaufmann, NP  FARXIGA 10 MG TABS tablet Take 10 mg by mouth daily. 12/04/22   [provider]  ferrous sulfate 325 (65 FE) MG tablet Take 325 mg by mouth daily with breakfast.    [provider]  fluticasone (FLONASE ALLERGY RELIEF) 50 MCG/ACT nasal spray Place 2 sprays into both nostrils as needed.    [provider]  gabapentin (NEURONTIN) 800 MG tablet Take one by mouth at morning . Lunch and 2 at night. 05/24/23   Lomax, Amy, NP  lidocaine (XYLOCAINE) 5 % ointment Apply 1 Application topically 2 (two) times daily as needed. 06/19/23   Raspet, Noberto Retort, PA-C  losartan (COZAAR) 25 MG tablet  Take 25 mg by mouth daily.    [provider]  Magnesium 400 MG TABS 1 tablet Orally once a day    [provider]  meclizine (ANTIVERT) 12.5 MG tablet Take 1 tablet (12.5 mg total) by mouth 3 (three) times daily as needed for up to 14 days for dizziness. 06/24/23 07/08/23  Pilar Jarvis, MD  metaxalone [facility]) 800 MG tablet Take by mouth. 06/17/23   [provider]  metFORMIN (GLUCOPHAGE) 500 MG tablet Take 1,000 mg by mouth 2 (two) times daily. At bedtime    [provider]  pantoprazole (PROTONIX) 40 MG tablet Take 40 mg by mouth daily.    [provider]  rosuvastatin (CRESTOR) 10 MG tablet Take 10 mg by mouth daily.    [provider]  vitamin B-12 (CYANOCOBALAMIN) 1000 MCG tablet Take 1,000 mcg by mouth daily.    [provider]    Physical Exam   Triage Vital Signs: ED Triage Vitals  Encounter Vitals Group     BP 07/06/23 0332 120/78     Systolic BP Percentile --      Diastolic BP Percentile --      Pulse Rate 07/06/23 0332 (!) 102     Resp 07/06/23 0332 16     Temp 07/06/23 0332 97.7 F (36.5 C)     Temp Source 07/06/23 0332 Oral     SpO2 07/06/23 0332 100 %     Weight 07/06/23 0331 146 lb 2.6 oz (66.3 kg)     Height 07/06/23 0331 5\' 5"  (1.651 m)     Head Circumference --      Peak Flow --      Pain Score 07/06/23 0331 0     Pain Loc --      Pain Education --      Exclude from Growth Chart --     Most recent vital signs: Vitals:   07/06/23 0332  BP: 120/78  Pulse: (!) 102  Resp: 16  Temp: 97.7 F (36.5 C)  SpO2: 100%    CONSTITUTIONAL: Alert, oriented to person only.  Speech is nonsensical.  Elderly. HEAD: Normocephalic, atraumatic EYES: Conjunctivae clear, pupils appear equal, sclera nonicteric ENT: normal nose; moist mucous membranes NECK: Supple, normal ROM, no meningismus CARD: Regular and slightly tachycardic; S1 and S2 appreciated RESP: Normal chest excursion without splinting or tachypnea;  breath sounds clear and equal bilaterally; no wheezes, no rhonchi, no rales, no hypoxia or respiratory distress, speaking full sentences ABD/GI: Non-distended; soft, non-tender, no rebound, no guarding, no peritoneal signs BACK: The back appears normal EXT: Normal ROM in all joints; no deformity noted, no edema, left arm appears normal with soft compartments, left 2+ radial pulse SKIN: Normal color for age and race; warm; no rash on exposed skin, 1 scabbed lesion consistent with previous  shingles infection to the left palm NEURO: Moves all extremities equally, no dysarthria or aphasia but patient has nonsensical speech, no facial asymmetry, unable to reliably test sensation, does not follow commands appropriately PSYCH: The patient's mood and manner are appropriate.   ED Results / Procedures / Treatments   LABS: (all labs ordered are listed, but only abnormal results are displayed) Labs Reviewed  CBC - Abnormal; Notable for the following components:      Result Value   RBC 3.61 (*)    Hemoglobin 11.9 (*)    HCT 34.3 (*)    All other components within normal limits  URINALYSIS, ROUTINE W REFLEX MICROSCOPIC - Abnormal; Notable for the following components:   Color, Urine YELLOW (*)    APPearance CLEAR (*)    Glucose, UA >=500 (*)    Hgb urine dipstick SMALL (*)    Ketones, ur 20 (*)    Protein, ur 30 (*)    All other components within normal limits  COMPREHENSIVE METABOLIC PANEL - Abnormal; Notable for the following components:   Potassium 3.0 (*)    CO2 16 (*)    Glucose, Bld 164 (*)    Alkaline Phosphatase 31 (*)    Total Bilirubin 1.2 (*)    All other components within normal limits  URINE DRUG SCREEN, QUALITATIVE ([facility] ONLY)  PROTIME-INR  AMMONIA  LACTIC ACID, PLASMA  ETHANOL  TSH  ACETAMINOPHEN LEVEL  SALICYLATE LEVEL  PROCALCITONIN  BLOOD GAS, VENOUS  MAGNESIUM  TROPONIN I (HIGH SENSITIVITY)     EKG:  EKG Interpretation Date/Time:  Tuesday July 06 2023  03:38:58 EST Ventricular Rate:  108 PR Interval:  128 QRS Duration:  92 QT Interval:  370 QTC Calculation: 495 R Axis:   39  Text Interpretation: Sinus tachycardia Left ventricular hypertrophy with repolarization abnormality ( R in aVL , Romhilt-Estes ) Abnormal ECG When compared with ECG of 05-Aug-2020 09:52, ST now depressed in Lateral leads Nonspecific T wave abnormality no longer evident in Anterior leads QT has lengthened Confirmed by Rochele Raring [phone]) on 07/06/2023 3:41:35 AM         RADIOLOGY: My personal review and interpretation of imaging:  CT pending  I have personally reviewed all radiology reports.   No results found.   PROCEDURES:  Critical Care performed: No      .1-3 Lead EKG Interpretation  Performed by: Gabriana Wilmott, Layla Maw, DO Authorized by: Bich Mchaney, Layla Maw, DO     Interpretation: abnormal     ECG rate:  102   ECG rate assessment: tachycardic     Rhythm: sinus tachycardia     Ectopy: none     Conduction: normal       IMPRESSION / MDM / ASSESSMENT AND PLAN / ED COURSE  I reviewed the triage vital signs and the nursing notes.    Patient here with altered mental status.  Recently diagnosed with shingles of the left upper extremity which is healing.  The patient is on the cardiac monitor to evaluate for evidence of arrhythmia and/or significant heart rate changes.   DIFFERENTIAL DIAGNOSIS (includes but not limited to):   HSV encephalitis, stroke, intracranial hemorrhage, UTI, metabolic encephalopathy, hepatic encephalopathy, polypharmacy, intoxication   Patient's presentation is most consistent with acute presentation with potential threat to life or bodily function.   PLAN: Will obtain labs, urine, CT head.  Will give IV fluids.  Discussed with husband my concern for potential HSV encephalopathy however patient not a candidate for lumbar puncture at this  time due to being on Plavix and has been cannot recall when her last dose of medication  was taken.  Patient will need admission to the hospital.   MEDICATIONS GIVEN IN ED: Medications  potassium chloride 10 mEq in 100 mL IVPB (has no administration in time range)  LORazepam (ATIVAN) injection 1 mg (has no administration in time range)  sodium chloride 0.9 % bolus 1,000 mL (0 mLs Intravenous Stopped 07/06/23 0502)  LORazepam (ATIVAN) injection 1 mg (1 mg Intravenous Given 07/06/23 0617)     ED COURSE:  5:15 AM  CT tech unable to perform CT of the head due to patient not being able to stay still and being agitated.  Will give IV Ativan.  7:15 AM  Unfortunately there has been significant delay in getting results back from lab initially due to blood being sent in the wrong tubes and then due to clerical error.  Labs show no leukocytosis.  Stable mild anemia.  Potassium of 3.0.  Will give replacement.  Does have a bicarb of 16 with normal anion gap.  Blood glucose 164.  Normal TSH.  Negative troponin.  Normal ammonia level.  Negative lactic.  Urine shows no sign of infection.  Drug screen negative.  Ethanol negative.  Tylenol, salicylate, procalcitonin pending.  CT head reviewed and interpreted by myself and is severely motion degraded limiting evaluation.  Will need more sedation to obtain head imaging.   CONSULTS:  Consulted and discussed patient's case with hospitalist.  I have recommended admission and consulting physician agrees and will place admission orders.  Patient (and family if present) agree with this plan.   I reviewed all nursing notes, vitals, pertinent previous records.  All labs, EKGs, imaging ordered have been independently reviewed and interpreted by myself.]    OUTSIDE RECORDS REVIEWED: Reviewed last internal medicine visit on 06/17/2023.       FINAL CLINICAL IMPRESSION(S) / ED DIAGNOSES   Final diagnoses:  Altered mental status, unspecified altered mental status type  Hypokalemia  Metabolic acidosis     Rx / DC Orders   ED Discharge Orders      None        Note:  This document was prepared using Dragon voice recognition software and may include unintentional dictation errors.   Sims Laday, Layla Maw, DO 07/06/23 [phone]

## 2023-07-06 NOTE — Progress Notes (Signed)
SLP Cancellation Note  Patient Details Name: Theresa Ferguson MRN: [id number] DOB: [birth year]   Cancelled treatment:       Reason Eval/Treat Not Completed: Patient at procedure or test/unavailable  Per chart review, pt presents with progressive encephalopathy coupled with fever in the setting of recent shingles infection. Per Neurology note, "given febrile encephalopathy with no other clear cause, CNS infection absolutely must be ruled out and she is going for a lumbar puncture". Pt at procedure this afternoon.  ST services will f/u tomorrow for cognitive-linguistic assessment per pt appropriateness.       Jerilynn Som, MS, CCC-SLP Speech Language Pathologist Rehab Services; [facility] [phone] (ascom) Yvone Slape 07/06/2023, 3:32 PM

## 2023-07-06 NOTE — Consult Note (Signed)
NAME: Theresa Ferguson  DOB: May 06, 1946  MRN: [id number]  Date/Time: 07/07/23  12.30pm  REQUESTING PROVIDER: Dr. Chipper Herb Subjective:  REASON FOR CONSULT: VZV encephalitis ?No hisotry available from patient- spoke to husband, chart reviewed Theresa Ferguson is a 77 y.o. with a history of ca breast rt s/p lumpectomy, adjuvant radiation therapy and arimedex until 2022, anemia,has been unwell for the past 3 weeks.  Patient had gone to urgent care on 06/13/2023 complaining of dizziness of 2 days duration bordering on vertigo.  She had taken meclizine with some benefit.  They diagnosed her with labyrinthitis versus acute suppurative otitis media of the right ear and gave a prednisone, azithromycin and meclizine.  She returned to the [facility] urgent care on 06/17/2023 complaining of left-sided neck pain and spasms of the neck going down is her shoulder arm and hand.  An x-ray was done that was consistent with cervical radiculopathy.  She was prescribed Skelaxin and prednisone .  On 06/19/2023 she went to the ED complaining of a rash on the left arm which was painful and vesicular we diagnosed her with shingles and gave Valtrex 1 g twice a day for 7 days, gabapentin.  On 06/24/2023 she returned to the ED complaining of vertigo and inner ear issues.  She also complained of gait instability and dysmetria and a concern for central vertigo was raised and she got MRI of the brain which was negative for stroke.  She was discharged from the ED.  On 07/06/2023 patient return to the ED at around 3:30 AM having weakness altered mental status Nausea and poor appetite and not able to eat for the past couple of weeks. As per husband she was confused and sleeping the whole time In the ED vitals  07/06/23 03:32  BP 120/78  Temp 97.7 F (36.5 C)  Pulse Rate 102 !  Resp 16  SpO2 100 %     Latest Reference Range & Units 07/06/23  WBC 4.0 - 10.5 K/uL 5.4  Hemoglobin 12.0 - 15.0 g/dL 40.9 (L)  HCT 81.1 - 91.4 % 34.3 (L)   Platelets 150 - 400 K/uL 196  Creatinine 0.44 - 1.00 mg/dL 7.82  She then had a temp of 101.4 She underwent LP and csf examination was   Latest Reference Range & Units 07/06/23 15:43  Appearance, CSF CLEAR  CLEAR  HAZY ! (C) COLORLESS !  Glucose, CSF 40 - 70 mg/dL 77 (H)  RBC Count, CSF 0 - 3 /cu mm 0 - 3 /cu mm 2,342 (H) (C) 62 (H)  WBC, CSF 0 - 5 /cu mm 0 - 5 /cu mm 92 (HH) (C) 42 (HH) (C)  Segmented Neutrophils-CSF % % 6 (C) 0  Lymphs, CSF % % 85 (C) 88  Monocyte-Macrophage-Spinal Fluid % % 9 (C) 12  Eosinophils, CSF % % 0 0  Color, CSF COLORLESS  COLORLESS  PINK ! (C) CLEAR !  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   Personal history of radiation therapy    PONV (postoperative nausea and vomiting)    Radiculopathy of lumbosacral region 02/02/2013   RLS (restless legs syndrome) 02/05/2015   Seasonal allergies     Past Surgical History:  Procedure Laterality Date   BREAST LUMPECTOMY Right 08/07/2020   right lumpectomy w/ radiation   BREAST LUMPECTOMY WITH RADIOACTIVE SEED LOCALIZATION Right 08/07/2020   Procedure: RIGHT BREAST LUMPECTOMY WITH RADIOACTIVE SEED LOCALIZATION;  Surgeon: Emelia Loron, MD;  Location: [facility];  Service: General;  Laterality: Right;   FINGER SURGERY Right 08/31/1996   [facility]   kidney stones  08/31/1978   KNEE SURGERY Bilateral    partial right-2008,left full  replacement-2006-Dr [facility]   LAMINECTOMY     l4-5   LOWER EXTREMITY ANGIOGRAPHY Left 12/29/2022   Procedure: Lower Extremity Angiography;  Surgeon: Renford Dills, MD;  Location: [facility];  Service: Cardiovascular;  Laterality: Left;   LUMBAR DISC SURGERY      Social History   Socioeconomic History   Marital status: Married    Spouse name: Fayrene Fearing   Number of children: 2   Years of education: College   Highest education level: Not on file  Occupational History   Occupation: retired    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   Patient has two adult children.   Patient is retired.   Patient has a college education.   Patient is right-handed.   Patient drinks two cups of coffee daily.   Social Determinants of Health   Financial Resource Strain: Not on file  Food Insecurity: Patient Unable To Answer (07/06/2023)   Hunger Vital Sign    Worried About Running Out of Food in the Last Year: Patient unable to answer    Ran Out of Food in the Last Year: Patient unable to answer  Transportation Needs: Patient Unable To Answer (07/06/2023)   PRAPARE - Transportation    Lack of Transportation (Medical): Patient unable to answer    Lack of Transportation (Non-Medical): Patient unable to answer  Physical Activity: Not on file  Stress: Not on file  Social Connections: Not on file  Intimate Partner Violence: Patient Unable To Answer (07/06/2023)    Humiliation, Afraid, Rape, and Kick questionnaire    Fear of Current or Ex-Partner: Patient unable to answer    Emotionally Abused: Patient unable to answer    Physically Abused: Patient unable to answer    Sexually Abused: Patient unable to answer    Family History  Problem Relation Age of Onset   Diabetes Son    Breast cancer Neg Hx    Allergies  Allergen Reactions   Codeine    Latex    Morphine  And Codeine    Nickel    Penicillins    Atorvastatin Other (See Comments)   Cerivastatin Other (See Comments)   Fluvastatin Other (See Comments)   I? Current Facility-Administered Medications  Medication Dose Route Frequency Provider Last Rate Last Admin   0.9 %  sodium chloride infusion   Intravenous Continuous Emeline General, MD 125 mL/hr at 07/06/23 0927 New Bag at 07/06/23 0927   acetaminophen (TYLENOL) suppository 325 mg  325 mg Rectal Q4H PRN Emeline General, MD   325 mg at 07/06/23 1145   acyclovir (ZOVIRAX) 660 mg in dextrose 5 % 100 mL IVPB  660 mg Intravenous Q8H Effie Shy, RPH   Stopped at 07/06/23 1953   enoxaparin (LOVENOX) injection 40 mg  40 mg Subcutaneous Q24H Mikey College T, MD   40 mg at 07/06/23 1225   escitalopram (LEXAPRO) tablet 20 mg  20 mg Oral Daily Emeline General, MD       fluticasone (FLONASE) 50 MCG/ACT nasal spray 2 spray  2 spray Each Nare PRN Emeline General, MD       gabapentin (NEURONTIN) capsule 800 mg  800 mg Oral BID Otelia Sergeant, RPH       And   gabapentin (NEURONTIN) capsule 1,600 mg  1,600 mg Oral QHS Otelia Sergeant, RPH       haloperidol lactate (HALDOL) injection 5 mg  5 mg Intravenous Q6H PRN Mikey College T, MD   5 mg at 07/06/23 1423   HYDROmorphone (DILAUDID) injection 0.5 mg  0.5 mg Intravenous Q4H PRN Mikey College T, MD   0.5 mg at 07/06/23 1500   insulin aspart (novoLOG) injection 0-15 Units  0-15 Units Subcutaneous TID WC Emeline General, MD   2 Units at 07/06/23 1225   lidocaine (XYLOCAINE) 5 % ointment 1 Application  1 Application  Topical BID PRN Emeline General, MD       lidocaine (XYLOCAINE) injection 1 % - NO CHARGE  5 mL Subcutaneous Once Mikey College T, MD       LORazepam (ATIVAN) injection 1 mg  1 mg Intravenous Q6H PRN Mikey College T, MD   1 mg at 07/06/23 1717   losartan (COZAAR) tablet 25 mg  25 mg Oral Daily Mikey College T, MD       meropenem (MERREM) 2 g in sodium chloride 0.9 % 100 mL IVPB  2 g Intravenous Q8H Hunt, Madison H, RPH       ondansetron (ZOFRAN) tablet 4 mg  4 mg Oral Q6H PRN Emeline General, MD       Or   ondansetron [facility]) injection 4 mg  4 mg Intravenous Q6H PRN Emeline General, MD       pantoprazole (PROTONIX) EC tablet 40 mg  40 mg Oral Daily Mikey College T, MD       rosuvastatin (CRESTOR) tablet 20 mg  20 mg Oral Daily Mikey College T, MD       vancomycin (VANCOREADY) IVPB 1750 mg/350 mL  1,750 mg Intravenous Once Hunt, Madison H, RPH       Followed by   Melene Muller ON 07/07/2023] vancomycin (VANCOREADY) IVPB 500 mg/100 mL  500 mg Intravenous Q12H Hunt, Madison H, [facility]       Current Outpatient Medications  Medication Sig Dispense Refill   lidocaine (XYLOCAINE) 5 % ointment Apply 1 Application topically 2 (two) times daily as needed. 50 g 0   metFORMIN (GLUCOPHAGE-XR) 500 MG 24 hr tablet Take  1,000 mg by mouth 2 (two) times daily.     traMADol (ULTRAM) 50 MG tablet Take 50 mg by mouth.  1 tablet as needed Orally up to twice a day for shingles pain     clopidogrel (PLAVIX) 75 MG tablet Take 1 tablet (75 mg total) by mouth daily. 30 tablet 11   escitalopram (LEXAPRO) 20 MG tablet TAKE 1 TABLET BY MOUTH ONCE DAILY 90 tablet 0   FARXIGA 10 MG TABS tablet Take 10 mg by mouth daily.     ferrous sulfate 325 (65 FE) MG tablet Take 325 mg by mouth daily with breakfast.     fluticasone (FLONASE ALLERGY RELIEF) 50 MCG/ACT nasal spray Place 2 sprays into both nostrils as needed. (Patient not taking: Reported on 07/06/2023)     gabapentin (NEURONTIN) 800 MG tablet Take one by mouth at morning . Lunch and 2 at night. 360  tablet 1   losartan (COZAAR) 25 MG tablet Take 25 mg by mouth daily.     Magnesium 400 MG TABS 1 tablet Orally once a day     meclizine (ANTIVERT) 12.5 MG tablet Take 1 tablet (12.5 mg total) by mouth 3 (three) times daily as needed for up to 14 days for dizziness. 30 tablet 0   metaxalone (SKELAXIN) 800 MG tablet Take by mouth. (Patient not taking: Reported on 07/06/2023)     metFORMIN (GLUCOPHAGE) 500 MG tablet Take 1,000 mg by mouth 2 (two) times daily. At bedtime     pantoprazole (PROTONIX) 40 MG tablet Take 40 mg by mouth daily.     rosuvastatin (CRESTOR) 10 MG tablet Take 10 mg by mouth daily. (Patient not taking: Reported on 07/06/2023)     rosuvastatin (CRESTOR) 20 MG tablet Take 1 tablet by mouth daily.     vitamin B-12 (CYANOCOBALAMIN) 1000 MCG tablet Take 1,000 mcg by mouth daily.       Abtx:  Anti-infectives (From admission, onward)    Start     Dose/Rate Route Frequency Ordered Stop   07/07/23 2100  vancomycin (VANCOCIN) IVPB 1000 mg/200 mL premix  Status:  Discontinued       Placed in "Followed by" Linked Group   1,000 mg 200 mL/hr over 60 Minutes Intravenous Every 24 hours 07/06/23 2019 07/06/23 2032   07/07/23 0900  vancomycin (VANCOREADY) IVPB 500 mg/100 mL       Placed in "Followed by" Linked Group   500 mg 100 mL/hr over 60 Minutes Intravenous Every 12 hours 07/06/23 2036     07/06/23 2100  vancomycin (VANCOREADY) IVPB 1500 mg/300 mL  Status:  Discontinued       Placed in "Followed by" Linked Group   1,500 mg 150 mL/hr over 120 Minutes Intravenous  Once 07/06/23 2019 07/06/23 2032   07/06/23 2100  meropenem (MERREM) 2 g in sodium chloride 0.9 % 100 mL IVPB        2 g 280 mL/hr over 30 Minutes Intravenous Every 8 hours 07/06/23 2019     07/06/23 2100  vancomycin (VANCOREADY) IVPB 1750 mg/350 mL       Placed in "Followed by" Linked Group   1,750 mg 175 mL/hr over 120 Minutes Intravenous  Once 07/06/23 2036     07/06/23 1800  acyclovir (ZOVIRAX) 660 mg in dextrose 5 %  100 mL IVPB        660 mg 113.2 mL/hr over 60 Minutes Intravenous Every 8 hours 07/06/23 1715         REVIEW OF SYSTEMS:  NA Objective:  VITALS:  BP (!) 144/74   Pulse 95   Temp 99.8 F (37.7 C) (Axillary)   Resp 18   Ht 5\' 5"  (1.651 m)   Wt 66.3 kg   SpO2 100%   BMI 24.32 kg/m   PHYSICAL EXAM:  General: awake, pleasantly confused oriented in person, word jumble, fidgety  no distress, appears stated age. Follwos some commands Head: Normocephalic, without obvious abnormality, atraumatic. Eyes: Conjunctivae clear, anicteric sclerae. Pupils are equal ENT Nares normal. No drainage or sinus tenderness. Lips, mucosa, and tongue normal. No Thrush Neck: Supple, symmetrical, no adenopathy, thyroid: non tender no carotid bruit and no JVD. Back: No CVA tenderness. Lungs: Clear to auscultation bilaterally. No Wheezing or Rhonchi. No rales. Heart: Regular rate and rhythm, no murmur, rub or gallop. Abdomen: Soft, non-tender,not distended. Bowel sounds normal. No masses Extremities: left arm- in the palm there is a scab- no other lesions seen  atraumatic, no cyanosis. No edema. No clubbing Skin: No rashes or lesions. Or bruising Lymph: Cervical, supraclavicular normal. Neurologic: moves all extremities Pertinent Labs Lab Results CBC    Component Value Date/Time   WBC 5.4 07/06/2023 0346   RBC 3.61 (L) 07/06/2023 0346   HGB 11.9 (L) 07/06/2023 0346   HCT 34.3 (L) 07/06/2023 0346   PLT 196 07/06/2023 0346   MCV 95.0 07/06/2023 0346   MCH 33.0 07/06/2023 0346   MCHC 34.7 07/06/2023 0346   RDW 13.6 07/06/2023 0346   LYMPHSABS 0.9 04/19/2023 1112   MONOABS 0.3 04/19/2023 1112   EOSABS 0.1 04/19/2023 1112   BASOSABS 0.0 04/19/2023 1112       Latest Ref Rng & Units 07/06/2023    5:36 AM 06/24/2023   11:36 AM 12/29/2022    9:29 AM  CMP  Glucose 70 - 99 mg/dL 865  784    BUN 8 - 23 mg/dL 19  30  27    Creatinine 0.44 - 1.00 mg/dL 6.96  2.95  2.84   Sodium 135 - 145 mmol/L  139  137    Potassium 3.5 - 5.1 mmol/L 3.0  3.2    Chloride 98 - 111 mmol/L 108  104    CO2 22 - 32 mmol/L 16  23    Calcium 8.9 - 10.3 mg/dL 9.3  9.5    Total Protein 6.5 - 8.1 g/dL 6.7     Total Bilirubin <1.2 mg/dL 1.2     Alkaline Phos 38 - 126 U/L 31     AST 15 - 41 U/L 26     ALT 0 - 44 U/L 13         Microbiology: Recent Results (from the past 240 hour(s))  CSF culture w Gram Stain     Status: None (Preliminary result)   Collection Time: 07/06/23  3:43 PM   Specimen: Lumbar Puncture; Cerebrospinal Fluid  Result Value Ref Range Status   Specimen Description CSF  Final   Special Requests NONE  Final   Gram Stain   Final    NO ORGANISMS SEEN WBC SEEN RED BLOOD CELLS PRESENT Performed at [facility], [address] [place], [place] [address]    Culture PENDING  Incomplete   Report Status PENDING  Incomplete    IMAGING RESULTS: CT head no acute findings  I have personally reviewed the films ? 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 D>D NMDAR encephaiiits R/o carcinomatosis of the meningis   H/o Ca breast. S/p lumpectomy, radiation and had taken Arimedex  HTN on losartan  HLD on atorvastatin  Discussed with husband and care team Note:  This document was prepared using Dragon voice recognition software and may include unintentional dictation errors.

## 2023-07-06 NOTE — Consult Note (Addendum)
Pharmacy Note - Acyclovir   Theresa Ferguson is a 77 y.o. female admitted on 07/06/2023 with  confusion and agitation  with fever. Neurology consulted for febrile encephalopathy with no clear indication. LP to rule out CNS infection. CSF had elevated WBC, leukocytes > neutrophils, hazy, glucose 77, protein 49. Pharmacy has been consulted for acyclovir dosing and dosing for ampicillin, vancomycin, and rocephin for meningitis.  Plan: Day 1 of antibiotics Give vancomycin 1500 mg IV x1 followed by 500 mg IV Q12H. Goal trough 15-20. Given patient's documented severe allergy to penicillins, no use of cephalosporins in the past, and conversation with MD, will start patient on meropenem 2 g IV Q8H Continue acyclovir 660 mg Q8H Monitor renal function: 0.87 Patient's renal function: CrCl 49.5 on edge of cut off for Q12H dosing at < 50.  Normalized = ~ 54 mL/min Started patient on Q8H due to nature of CNS disease Follow that patient remain on fluids while receiving acyclovir  Currently 125 mL/hr continuous 0.9% NS Follow culture results  Height: 5\' 5"  (165.1 cm) Weight: 66.3 kg (146 lb 2.6 oz) IBW/kg (Calculated) : 57  Temp (24hrs), Avg:99.6 F (37.6 C), Min:97.7 F (36.5 C), Max:101.4 F (38.6 C)  Recent Labs  Lab 07/06/23 0346 07/06/23 0421 07/06/23 0536  WBC 5.4  --   --   CREATININE  --   --  0.87  LATICACIDVEN  --  1.6  --     Estimated Creatinine Clearance: 49.5 mL/min (by C-G formula based on SCr of 0.87 mg/dL).    Allergies  Allergen Reactions   Codeine    Latex    Morphine And Codeine    Nickel    Penicillins    Atorvastatin Other (See Comments)   Cerivastatin Other (See Comments)   Fluvastatin Other (See Comments)    Antimicrobials this admission: Acyclovir 11/5 >>  Vancomycin 11/5>> Meropenem 11/5>>  Potential Neurotoxic agents Losartan 25 mg daily   Dose adjustments this admission: NA  Microbiology results: 11/5 meningitis panel: in process 11/5 CSF  culture/gram stain: in process  11/5 Bcx: in process 11/5 Fungal: in process  Thank you for allowing pharmacy to be a part of this patient's care.  Merryl Hacker, PharmD Clinical Pharmacist 07/06/2023 7:58 PM

## 2023-07-06 NOTE — ED Notes (Signed)
Pt continues to make movements in radiology in preparation for lumbar puncture. MD approval for dilaudid dose to help pt stay still for procedure.

## 2023-07-07 ENCOUNTER — Ambulatory Visit: Payer: Medicare Other

## 2023-07-07 DIAGNOSIS — E872 Acidosis, unspecified: Secondary | ICD-10-CM | POA: Diagnosis not present

## 2023-07-07 DIAGNOSIS — E876 Hypokalemia: Secondary | ICD-10-CM

## 2023-07-07 DIAGNOSIS — G0490 Encephalitis and encephalomyelitis, unspecified: Secondary | ICD-10-CM | POA: Diagnosis not present

## 2023-07-07 DIAGNOSIS — R4182 Altered mental status, unspecified: Secondary | ICD-10-CM | POA: Diagnosis not present

## 2023-07-07 LAB — CBG MONITORING, ED
Glucose-Capillary: 118 mg/dL — ABNORMAL HIGH (ref 70–99)
Glucose-Capillary: 120 mg/dL — ABNORMAL HIGH (ref 70–99)
Glucose-Capillary: 132 mg/dL — ABNORMAL HIGH (ref 70–99)

## 2023-07-07 LAB — CSF CELL COUNT WITH DIFFERENTIAL
Eosinophils, CSF: 0 %
Eosinophils, CSF: 0 %
Lymphs, CSF: 85 %
Lymphs, CSF: 88 %
Monocyte-Macrophage-Spinal Fluid: 12 %
Monocyte-Macrophage-Spinal Fluid: 9 %
RBC Count, CSF: 62 /mm3 — ABNORMAL HIGH (ref 0–3)
RBC Count, CSF: 2342 /mm3 — ABNORMAL HIGH (ref 0–3)
Segmented Neutrophils-CSF: 6 %
Segmented Neutrophils-CSF: 0 %
Tube #: 3
Tube #: 1
WBC, CSF: 42 /mm3 (ref 0–5)
WBC, CSF: 92 /mm3 (ref 0–5)

## 2023-07-07 LAB — CBC
HCT: 27.2 % — ABNORMAL LOW (ref 36.0–46.0)
Hemoglobin: 9.1 g/dL — ABNORMAL LOW (ref 12.0–15.0)
MCH: 32.2 pg (ref 26.0–34.0)
MCHC: 33.5 g/dL (ref 30.0–36.0)
MCV: 96.1 fL (ref 80.0–100.0)
Platelets: 123 10*3/uL — ABNORMAL LOW (ref 150–400)
RBC: 2.83 MIL/uL — ABNORMAL LOW (ref 3.87–5.11)
RDW: 14.2 % (ref 11.5–15.5)
WBC: 6.4 10*3/uL (ref 4.0–10.5)
nRBC: 0 % (ref 0.0–0.2)

## 2023-07-07 LAB — MENINGITIS/ENCEPHALITIS PANEL (CSF)

## 2023-07-07 LAB — PROCALCITONIN: Procalcitonin: <0.1 ng/mL

## 2023-07-07 LAB — BASIC METABOLIC PANEL
Anion gap: 9 (ref 5–15)
BUN: 20 mg/dL (ref 8–23)
CO2: 15 mmol/L — ABNORMAL LOW (ref 22–32)
Calcium: 7.4 mg/dL — ABNORMAL LOW (ref 8.9–10.3)
Chloride: 115 mmol/L — ABNORMAL HIGH (ref 98–111)
Creatinine, Ser: 1.09 mg/dL — ABNORMAL HIGH (ref 0.44–1.00)
GFR, Estimated: 53 mL/min — ABNORMAL LOW (ref >60)
Glucose, Bld: 138 mg/dL — ABNORMAL HIGH (ref 70–99)
Potassium: 2.4 mmol/L — CL (ref 3.5–5.1)
Sodium: 139 mmol/L (ref 135–145)

## 2023-07-07 LAB — POTASSIUM: Potassium: 3.5 mmol/L (ref 3.5–5.1)

## 2023-07-07 LAB — PATHOLOGIST SMEAR REVIEW

## 2023-07-07 LAB — GLUCOSE, CAPILLARY: Glucose-Capillary: 91 mg/dL (ref 70–99)

## 2023-07-07 MED ORDER — MEROPENEM 1 G IV SOLR
2.0000 g | Freq: Two times a day (BID) | INTRAVENOUS | Status: DC
  Filled 2023-07-07: qty 40

## 2023-07-07 MED ORDER — DEXTROSE 5 % IV SOLN
660.0000 mg | Freq: Two times a day (BID) | INTRAVENOUS | Status: DC
  Administered 2023-07-07 – 2023-07-12 (×9): 660 mg via INTRAVENOUS
  Filled 2023-07-07 (×10): qty 13.2

## 2023-07-07 MED ORDER — POTASSIUM CHLORIDE CRYS ER 20 MEQ PO TBCR
40.0000 meq | EXTENDED_RELEASE_TABLET | ORAL | Status: AC
  Administered 2023-07-07 (×3): 40 meq via ORAL
  Filled 2023-07-07 (×3): qty 2

## 2023-07-07 MED ORDER — SODIUM CHLORIDE 0.9 % IV SOLN: INTRAVENOUS | Status: AC

## 2023-07-07 MED ORDER — VANCOMYCIN HCL IN DEXTROSE 1-5 GM/200ML-% IV SOLN: 1000.0000 mg | INTRAVENOUS | Status: DC

## 2023-07-07 MED ORDER — DEXTROSE 5 % IV SOLN: 660.0000 mg | Freq: Two times a day (BID) | INTRAVENOUS | Status: DC

## 2023-07-07 MED ORDER — VANCOMYCIN HCL 500 MG/100ML IV SOLN
500.0000 mg | Freq: Once | INTRAVENOUS | Status: AC
  Administered 2023-07-07: 500 mg via INTRAVENOUS
  Filled 2023-07-07: qty 100

## 2023-07-07 NOTE — ED Notes (Addendum)
Assumed care from [facility], RN; patient adjusted in bed; non skid socks were re-applied. Patient appears comfortable with no needs at this time.

## 2023-07-07 NOTE — Evaluation (Signed)
Speech Language Pathology Evaluation Patient Details Name: Theresa Ferguson MRN: [id number] DOB: 1946/05/20 Today's Date: 07/07/2023 Time: 1405-1440 SLP Time Calculation (min) (ACUTE ONLY): 35 min  Problem List:  Patient Active Problem List   Diagnosis Date Noted   AMS (altered mental status) 07/06/2023   PAD (peripheral artery disease) (HCC) 12/20/2022   Leg pain 06/30/2022   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   Chronic venous insufficiency 06/24/2022   Diabetes (HCC) 06/24/2022   Hyperlipidemia 06/24/2022   Iron deficiency anemia 04/15/2021   Anxiety 03/26/2021   Goals of care, counseling/discussion 08/19/2020   Malignant neoplasm of upper-outer quadrant of right breast in female, 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   Other seasonal allergic rhinitis 05/13/2015   Restless legs syndrome (RLS) 05/13/2015   Diabetic polyneuropathy associated with diabetes mellitus due to underlying condition (HCC) 05/13/2015   RLS (restless legs syndrome) 02/05/2015   Neuropathy 02/05/2015   DDD (degenerative disc disease), lumbar 02/02/2014   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   Radiculopathy of lumbosacral region 02/02/2013   Past Medical History:  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   Complication of anesthesia    CTS (carpal tunnel syndrome)    bil, by EMG/[place]   GERD (gastroesophageal reflux disease)    Hyperlipemia    Hypertension    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   Personal history of radiation therapy    PONV (postoperative nausea and vomiting)    Radiculopathy of lumbosacral region 02/02/2013   RLS (restless legs syndrome) 02/05/2015   Seasonal allergies    Past Surgical History:  Past Surgical History:  Procedure Laterality Date   BREAST LUMPECTOMY Right 08/07/2020   right lumpectomy w/ radiation   BREAST LUMPECTOMY WITH RADIOACTIVE SEED LOCALIZATION Right 08/07/2020    Procedure: RIGHT BREAST LUMPECTOMY WITH RADIOACTIVE SEED LOCALIZATION;  Surgeon: Emelia Loron, MD;  Location: [facility];  Service: General;  Laterality: Right;   FINGER SURGERY Right 08/31/1996   [facility]   kidney stones  08/31/1978   KNEE SURGERY Bilateral    partial right-2008,left full replacement-2006-Dr [facility]   LAMINECTOMY     l4-5   LOWER EXTREMITY ANGIOGRAPHY Left 12/29/2022   Procedure: Lower Extremity Angiography;  Surgeon: Renford Dills, MD;  Location: [facility];  Service: Cardiovascular;  Laterality: Left;   LUMBAR DISC SURGERY     HPI:  Pt is a 77 y.o. female with history of hypertension, hyperlipidemia, RLS, GERD, anxiety, neuropathy, diabetes, breast cancer who presents to the emergency department with her husband for concerns for altered mental status increasing in past ~2 days.  Spouse reports over the past couple of weeks, she has been dealing with vertigo and shingles and seems to have declined since. He also reports she has not been eating or drinking normally. First noticed the increasing confusion in the past ~week.    MRI this admit and last month indicate: "No evidence of acute intracranial abnormality.  2. Moderate chronic microvascular ischemic disease.".   Assessment / Plan / Recommendation Clinical Impression   Pt seen today for informal cognitive-communication and language screening at bedside. Pt awake, verbal w/ global language deficits and inattention noted. Pt was active in the bed at times; difficult to redirect. Constant talking, jargon. Husband present.  On RA, afebrile. WBC WNL. Noted other Labs -- see chart.  Noted MRI(s) results indicating "No evidence of acute  intracranial abnormality.  2. Moderate chronic microvascular ischemic disease.".   Pt presents with a fairly fluent language abilities in spontaneous speech but w/ cognitive-communication deficits c/b circumlocutory fluent speech. Both semantic and  phonemic paraphasias noted, intermittent, MOD word-finding difficulty, and fluent jargon speech. Pt with primarily poor awareness of speech errors and errors in her responses/conversation. Verbal perseveration noted intermittently. Mild difficulty noted with repetition and confrontational naming for common objects tasks. Pt demonstrated some intact basic auditory comprehension (simple yes/no questions and 1-step directives were inconsistent) -- accuracy w/ auditory comprehension tasks appeared impacted by her SEVERE degree of inattention. Paraphasic errors involved both phonemic and semantic errors -- pt was inconsistently aware of her errors and often did not self-correct. Pt had difficulty maintaining topic; often moving about in bed and looking around -- seemed to "talk" to someone over her shoulder however no one was present. Pt required frequent redirection to task.  [employer] Scales of Cognitive Functioning: Confused, Inappropriate Non-Agitated: Maximal Assistance (VI>V).    In setting of pt's current presentation and Neurology indicating concern for CNS infection, pt will need 100% Supervision w/ all ADLs tasks currently for safety and follow through. Recommend f/u w/ skilled ST services at next venue of care post Acuity of illness; in a more structured setting also. Anticipate need for continued SLP services for assessment to address cognitive-communication skills in ADLs as well as need for frequent/constant Supervision at D/C for Safety d/t decreased awareness and decision-making. SLP explained this to Husband who agreed.  NSG updated.    SLP Assessment  SLP Recommendation/Assessment: All further Speech Lanaguage Pathology  needs can be addressed in the next venue of care SLP Visit Diagnosis: Cognitive communication deficit (R41.841)    Recommendations for follow up therapy are one component of a multi-disciplinary discharge planning process, led by the attending physician.   Recommendations may be updated based on patient status, additional functional criteria and insurance authorization.    Follow Up Recommendations  Follow physician's recommendations for discharge plan and follow up therapies (next venue of care)    Assistance Recommended at Discharge  Frequent or constant Supervision/Assistance (for safety)  Functional Status Assessment Patient has had a recent decline in their functional status and demonstrates the ability to make significant improvements in function in a reasonable and predictable amount of time.  Frequency and Duration  (tbd)   (tbd)      SLP Evaluation Cognition  Overall Cognitive Status: Impaired/Different from baseline Arousal/Alertness: Awake/alert Orientation Level: Oriented to person (only) Attention: Focused;Sustained Focused Attention: Impaired Focused Attention Impairment: Verbal basic;Functional basic Sustained Attention: Impaired Sustained Attention Impairment: Verbal basic;Functional basic Memory: Impaired Memory Impairment: Decreased recall of new information Awareness: Impaired Awareness Impairment: Anticipatory impairment Executive Function: Organizing Organizing: Impaired Organizing Impairment: Verbal basic;Functional basic Behaviors: Restless;Physical agitation;Confabulation Safety/Judgment: Impaired Comments: attempting to get out of bed despite instruction not to [facility] Scales of Cognitive Functioning: Confused, Inappropriate Non-Agitated: Maximal Assistance (b/t VI-V)       Comprehension  Auditory Comprehension Overall Auditory Comprehension: Impaired Yes/No Questions: Impaired Basic Biographical Questions: 0-25% accurate Basic Immediate Environment Questions: 0-24% accurate Commands: Impaired One Step Basic Commands: 0-24% accurate Conversation: Simple Other Conversation Comments: inattentive; confusion Interfering Components: Attention EffectiveTechniques: Repetition (?) Visual  Recognition/Discrimination Discrimination: Not tested Reading Comprehension Reading Status: Not tested    Expression Expression Primary Mode of Expression: Verbal Verbal Expression Overall Verbal Expression: Impaired Automatic Speech: Name;Social Response (wfl) Level of Generative/Spontaneous Verbalization: Sentence (constant talking) Repetition: Impaired Level of Impairment:  Word level Naming: Impairment Responsive: 26-50% accurate Confrontation: Not tested Verbal Errors: Semantic paraphasias;Phonemic paraphasias;Confabulation;Jargon;Not aware of errors Pragmatics: Impairment Interfering Components: Attention Effective Techniques: Semantic cues;Phonemic cues Non-Verbal Means of Communication: Not applicable Written Expression Dominant Hand: Right Written Expression: Not tested   Oral / Motor  Oral Motor/Sensory Function Overall Oral Motor/Sensory Function: Within functional limits (in general function, speech. Difficult to formally assess d/t pt's poor follow through w/ tasks.) Facial ROM: Within Functional Limits Facial Symmetry: Within Functional Limits Facial Strength: Within Functional Limits Lingual Symmetry: Within Functional Limits Lingual Strength: Within Functional Limits Lingual Sensation: Within Functional Limits Motor Speech Overall Motor Speech:  (Difficult to assess)              Jerilynn Som, MS, CCC-SLP Speech Language Pathologist Rehab Services; [facility] [place] [phone] (ascom) Deshane Cotroneo 07/07/2023, 3:40 PM

## 2023-07-07 NOTE — Progress Notes (Signed)
Patient arrived to unit on stretcher in stable condition with family present.  Patient alert, Patient transferred to bed with Max assist x3.  Patient and family educated fall precautions, encouraged to stay with patient to decrease agitation.  Spouse educated bed alarm use to maintain patient safety with reported understanding.

## 2023-07-07 NOTE — ED Notes (Signed)
Patient quiet and resting.

## 2023-07-07 NOTE — ED Notes (Signed)
Assisted pt on and off bedpan. Pt has no complaints at this time. Call bell within reach.

## 2023-07-07 NOTE — Progress Notes (Signed)
Subjective: Significant improvement since I saw her yesterday.  She denies headache  Exam: Vitals:   07/07/23 1545 07/07/23 1717  BP:  (!) 173/78  Pulse:  92  Resp:    Temp: 98.2 F (36.8 C) 97.7 F (36.5 C)  SpO2:  100%   Gen: In bed, NAD Resp: non-labored breathing, no acute distress Abd: soft, nt  Neuro: MS: Awake, knows it is November, not sure that she is in the hospital but knows she recently "had a procedure" she does not recognize her husband who is in the room.  Her speech is tangential, and disorganized and there appears to be some confabulation. CN: Pupils equal round and reactive, EOMI visual fields full Motor: No drift in either upper extremity Sensory: Intact to light touch   Pertinent Labs: Results for orders placed or performed during the hospital encounter of 07/06/23 (from the past 24 hour(s))  Procalcitonin     Status: None   Collection Time: 07/06/23 11:01 PM  Result Value Ref Range   Procalcitonin <0.10 ng/mL  CBG monitoring, ED     Status: Abnormal   Collection Time: 07/07/23 12:45 AM  Result Value Ref Range   Glucose-Capillary 118 (H) 70 - 99 mg/dL  Basic metabolic panel     Status: Abnormal   Collection Time: 07/07/23  5:47 AM  Result Value Ref Range   Sodium 139 135 - 145 mmol/L   Potassium 2.4 (LL) 3.5 - 5.1 mmol/L   Chloride 115 (H) 98 - 111 mmol/L   CO2 15 (L) 22 - 32 mmol/L   Glucose, Bld 138 (H) 70 - 99 mg/dL   BUN 20 8 - 23 mg/dL   Creatinine, Ser 0.98 (H) 0.44 - 1.00 mg/dL   Calcium 7.4 (L) 8.9 - 10.3 mg/dL   GFR, Estimated 53 (L) >60 mL/min   Anion gap 9 5 - 15  CBC     Status: Abnormal   Collection Time: 07/07/23  5:47 AM  Result Value Ref Range   WBC 6.4 4.0 - 10.5 K/uL   RBC 2.83 (L) 3.87 - 5.11 MIL/uL   Hemoglobin 9.1 (L) 12.0 - 15.0 g/dL   HCT 11.9 (L) 14.7 - 82.9 %   MCV 96.1 80.0 - 100.0 fL   MCH 32.2 26.0 - 34.0 pg   MCHC 33.5 30.0 - 36.0 g/dL   RDW 56.2 13.0 - 86.5 %   Platelets 123 (L) 150 - 400 K/uL   nRBC 0.0 0.0  - 0.2 %  CBG monitoring, ED     Status: Abnormal   Collection Time: 07/07/23  7:29 AM  Result Value Ref Range   Glucose-Capillary 120 (H) 70 - 99 mg/dL  CBG monitoring, ED     Status: Abnormal   Collection Time: 07/07/23 11:22 AM  Result Value Ref Range   Glucose-Capillary 132 (H) 70 - 99 mg/dL  Glucose, capillary     Status: None   Collection Time: 07/07/23  5:26 PM  Result Value Ref Range   Glucose-Capillary 91 70 - 99 mg/dL  Potassium     Status: None   Collection Time: 07/07/23  7:10 PM  Result Value Ref Range   Potassium 3.5 3.5 - 5.1 mmol/L   CSF WBC 42 CSF RBC 62 CSF protein 49 CSF glucose 77 Meningoencephalitis panel-negative  Impression: 77 year old female who presents with progressive encephalopathy following a 3-week episode concerning for shingles.  She also has had two episodes of vertigo in that timeframe.  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  Autoimmune encephalitis following herpes infections can occur, and may need to be consideration.  The fact that she was febrile, however, continues giving some pause.  I am hesitant to start steroids in a delirious patient with the amount of improvement that she has had since yesterday and I would favor continuing to observe for now.  Valacyclovir can cause delirium, but I would not expect this to cause an abnormal CSF result.  Recommendations: 1) autoimmune encephalitis panel 2) VZV PCR in CSF 3) continue to follow-up other CSF results 4) neurology will continue to follow.  Ritta Slot, MD [facility] [phone]  If 7pm- 7am, please page neurology on call as listed in AMION.

## 2023-07-07 NOTE — Progress Notes (Signed)
Eeg done 

## 2023-07-07 NOTE — ED Notes (Signed)
Placed mittens on patient due to pulling at IV lines and cords.

## 2023-07-07 NOTE — ED Notes (Signed)
Assisted patient on and off bedpan; urinated x 1.

## 2023-07-07 NOTE — Procedures (Signed)
History: 77 year old with encephalopathy  Sedation: None  Patient State: Awake and drowsy  Technique: This EEG was acquired with electrodes placed according to the International 10-20 electrode system (including Fp1, Fp2, F3, F4, C3, C4, P3, P4, O1, O2, T3, T4, T5, T6, A1, A2, Fz, Cz, Pz). The following electrodes were missing or displaced: none.   Background: The background is fairly disorganized and consists predominantly of generalized irregular delta and theta range activities.  There is a very poorly sustained posterior dominant rhythm of 9 Hz that is seen at times.  There is no epileptiform discharge seen.  Photic stimulation: Physiologic driving is now performed  EEG Abnormalities: 1) generalized irregular slow activity  Clinical Interpretation: This EEG is consistent with a generalized non-specific cerebral dysfunction(encephalopathy). There was no seizure or seizure predisposition recorded on this study. Please note that lack of epileptiform activity on EEG does not preclude the possibility of epilepsy.   Ritta Slot, MD [facility] [phone]  If 7pm- 7am, please page neurology on call as listed in AMION.

## 2023-07-07 NOTE — ED Notes (Signed)
Patient self d'cd 20G to RAC; covered with gauze and koban. Husband is now at bedside.

## 2023-07-07 NOTE — Evaluation (Signed)
Physical Therapy Evaluation Patient Details Name: Theresa Ferguson MRN: [id number] DOB: May 12, 1946 Today's Date: 07/07/2023  History of Present Illness  Theresa Ferguson is a 77 y.o. female with medical history significant of intermittent vertigo, HTN, HLD, right breast cancer status post lumpectomy and radioactive seed treatment, PVD on Plavix, IIDM, chronic peripheral neuropathy on gabapentin, brought in by family member for evaluation of worsening of mentations.   Clinical Impression  Patient received in bed, husband and son at bedside. Patient is pleasantly confused. She demonstrates poor awareness and difficulty following direction. She is able to sit up in bed independently with cues. Patient stood at edge of bed and is very unsteady. Losing balance posteriorly and laterally. Requiring mod A. She attempted to take a couple of steps but again is very unsteady. Patient assisted back to bed. She will continue to benefit from skilled PT to improve safety and independence with mobility.             If plan is discharge home, recommend the following: A lot of help with walking and/or transfers;A lot of help with bathing/dressing/bathroom   Can travel by private vehicle   No    Equipment Recommendations None recommended by PT  Recommendations for Other Services       Functional Status Assessment Patient has had a recent decline in their functional status and demonstrates the ability to make significant improvements in function in a reasonable and predictable amount of time.     Precautions / Restrictions Precautions Precautions: Fall Restrictions Weight Bearing Restrictions: No      Mobility  Bed Mobility Overal bed mobility: Needs Assistance Bed Mobility: Supine to Sit, Sit to Supine     Supine to sit: Supervision, HOB elevated Sit to supine: Mod assist   General bed mobility comments: patient could not process how to get back into bed, requiring assistance to get positioned.     Transfers Overall transfer level: Needs assistance Equipment used: 1 person hand held assist Transfers: Sit to/from Stand Sit to Stand: Mod assist           General transfer comment: patient is very unsteady in standing. Attempted to take a couple of side steps at edge of bed with difficulty following directions and very unsteady.    Ambulation/Gait               General Gait Details: not attempted/unsafe  Stairs            Wheelchair Mobility     Tilt Bed    Modified Rankin (Stroke Patients Only)       Balance Overall balance assessment: Needs assistance Sitting-balance support: Feet supported Sitting balance-Leahy Scale: Good     Standing balance support: Single extremity supported, During functional activity, Reliant on assistive device for balance Standing balance-Leahy Scale: Poor Standing balance comment: very unsteady in standing. Falling in all directions.                             Pertinent Vitals/Pain Pain Assessment Pain Assessment: No/denies pain Breathing: normal Negative Vocalization: none Facial Expression: smiling or inexpressive Body Language: relaxed Consolability: no need to console PAINAD Score: 0    Home Living Family/patient expects to be discharged to:: Private residence Living Arrangements: Spouse/significant other Available Help at Discharge: Family Type of Home: House Home Access: Level entry       Home Layout: Able to live on main level with bedroom/bathroom Home Equipment: Rolling  Walker (2 wheels)      Prior Function Prior Level of Function : Independent/Modified Independent             Mobility Comments: patient does not normally use Ad, she had been using a RW recently due to increased dizziness ( all info provided by spouse) ADLs Comments: Husband assists as needed     Extremity/Trunk Assessment   Upper Extremity Assessment Upper Extremity Assessment: Defer to OT evaluation     Lower Extremity Assessment Lower Extremity Assessment: Overall WFL for tasks assessed    Cervical / Trunk Assessment Cervical / Trunk Assessment: Normal  Communication   Communication Communication: No apparent difficulties Cueing Techniques: Verbal cues;Gestural cues  Cognition Arousal: Alert Behavior During Therapy: WFL for tasks assessed/performed Overall Cognitive Status: Impaired/Different from baseline Area of Impairment: Orientation, Following commands, Safety/judgement, Awareness, Problem solving                 Orientation Level: Disoriented to, Time, Situation, Place     Following Commands: Follows one step commands with increased time Safety/Judgement: Decreased awareness of safety Awareness: Intellectual Problem Solving: Requires verbal cues, Requires tactile cues          General Comments      Exercises     Assessment/Plan    PT Assessment Patient needs continued PT services  PT Problem List Decreased balance;Decreased mobility;Decreased coordination;Decreased cognition;Decreased safety awareness       PT Treatment Interventions DME instruction;Gait training;Functional mobility training;Therapeutic activities;Therapeutic exercise;Patient/family education;Cognitive remediation;Balance training    PT Goals (Current goals can be found in the Care Plan section)  Acute Rehab PT Goals Patient Stated Goal: none stated by patient, family wants improvement in cognition PT Goal Formulation: Patient unable to participate in goal setting Time For Goal Achievement: 07/21/23 Potential to Achieve Goals: Fair    Frequency Min 1X/week     Co-evaluation               AM-PAC PT "6 Clicks" Mobility  Outcome Measure Help needed turning from your back to your side while in a flat bed without using bedrails?: None Help needed moving from lying on your back to sitting on the side of a flat bed without using bedrails?: None Help needed moving to and from  a bed to a chair (including a wheelchair)?: A Lot Help needed standing up from a chair using your arms (e.g., wheelchair or bedside chair)?: A Little Help needed to walk in hospital room?: Total Help needed climbing 3-5 steps with a railing? : Total 6 Click Score: 15    End of Session   Activity Tolerance: Patient tolerated treatment well Patient left: in bed;with call bell/phone within reach;with family/visitor present Nurse Communication: Mobility status PT Visit Diagnosis: Unsteadiness on feet (R26.81);Other abnormalities of gait and mobility (R26.89);Difficulty in walking, not elsewhere classified (R26.2)    Time: 4098-1191 PT Time Calculation (min) (ACUTE ONLY): 13 min   Charges:   PT Evaluation $PT Eval Moderate Complexity: 1 Mod   PT General Charges $$ ACUTE PT VISIT: 1 Visit         Theresa Ferguson, PT, GCS 07/07/23,10:42 AM

## 2023-07-07 NOTE — [place] FL2 (Signed)
[place] MEDICAID FL2 LEVEL OF CARE FORM     IDENTIFICATION  Patient Name: Theresa Ferguson Birthdate: 1945/11/01 Sex: female Admission Date (Current Location): 07/06/2023  [facility] and [place] Number:  Chiropodist and Address:  [facility], [address] [place], [place] [address]      Provider Number: [id number]  Attending Physician Name and Address:  Loyce Dys, MD  Relative Name and Phone Number:  Fayrene Fearing (spouse)  [phone]    Current Level of Care: Hospital Recommended Level of Care: Skilled Nursing Facility Prior Approval Number:    Date Approved/Denied:   PASRR Number:  [id number]  Discharge Plan: SNF    Current Diagnoses: Patient Active Problem List   Diagnosis Date Noted   AMS (altered mental status) 07/06/2023   PAD (peripheral artery disease) (HCC) 12/20/2022   Leg pain 06/30/2022   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   Chronic venous insufficiency 06/24/2022   Diabetes (HCC) 06/24/2022   Hyperlipidemia 06/24/2022   Iron deficiency anemia 04/15/2021   Anxiety 03/26/2021   Goals of care, counseling/discussion 08/19/2020   Malignant neoplasm of upper-outer quadrant of right breast in female, 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   Other seasonal allergic rhinitis 05/13/2015   Restless legs syndrome (RLS) 05/13/2015   Diabetic polyneuropathy associated with diabetes mellitus due to underlying condition (HCC) 05/13/2015   RLS (restless legs syndrome) 02/05/2015   Neuropathy 02/05/2015   DDD (degenerative disc disease), lumbar 02/02/2014   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   Radiculopathy of lumbosacral region 02/02/2013    Orientation RESPIRATION BLADDER Height & Weight     Self, Place, Situation  Normal Continent Weight: 146 lb 2.6 oz (66.3 kg) Height:  5\' 5"  (165.1 cm)  BEHAVIORAL SYMPTOMS/MOOD NEUROLOGICAL BOWEL NUTRITION STATUS        Diet (see discharge summary)  AMBULATORY STATUS  COMMUNICATION OF NEEDS Skin   Limited Assist Verbally Normal                       Personal Care Assistance Level of Assistance  Bathing, Feeding, Dressing, Total care Bathing Assistance: Limited assistance Feeding assistance: Limited assistance Dressing Assistance: Limited assistance Total Care Assistance: Limited assistance   Functional Limitations Info  Sight, Hearing, Speech Sight Info: Adequate Hearing Info: Adequate Speech Info: Adequate    SPECIAL CARE FACTORS FREQUENCY  PT (By licensed PT), OT (By licensed OT)     PT Frequency: min 4x weekly OT Frequency: min 4x weekly            Contractures Contractures Info: Not present    Additional Factors Info  Code Status, Allergies Code Status Info: full Allergies Info: Codeine  Latex  Morphine And Codeine  Nickel  Penicillins  Atorvastatin  Cerivastatin  Fluvastatin           Current Medications (07/07/2023):  This is the current hospital active medication list Current Facility-Administered Medications  Medication Dose Route Frequency Provider Last Rate Last Admin   0.9 %  sodium chloride infusion   Intravenous Continuous Foye Deer, RPH 125 mL/hr at 07/07/23 1103 New Bag/Given (Non-Interop) at 07/07/23 1103   acetaminophen (TYLENOL) suppository 325 mg  325 mg Rectal Q4H PRN Mikey College T, MD   325 mg at 07/06/23 1145   acyclovir (ZOVIRAX) 660 mg in dextrose 5 % 100 mL IVPB  660 mg Intravenous Q12H Effie Shy, RPH       enoxaparin (LOVENOX) injection 40 mg  40 mg  Subcutaneous Q24H Mikey College T, MD   40 mg at 07/07/23 1138   escitalopram (LEXAPRO) tablet 20 mg  20 mg Oral Daily Mikey College T, MD   20 mg at 07/07/23 0934   fluticasone (FLONASE) 50 MCG/ACT nasal spray 2 spray  2 spray Each Nare PRN Emeline General, MD       gabapentin (NEURONTIN) capsule 800 mg  800 mg Oral BID Otelia Sergeant, RPH   800 mg at 07/07/23 1191   And   gabapentin (NEURONTIN) capsule 1,600 mg  1,600 mg Oral QHS Otelia Sergeant,  RPH       haloperidol lactate (HALDOL) injection 5 mg  5 mg Intravenous Q6H PRN Mikey College T, MD   5 mg at 07/06/23 1423   HYDROmorphone (DILAUDID) injection 0.5 mg  0.5 mg Intravenous Q4H PRN Mikey College T, MD   0.5 mg at 07/06/23 2146   insulin aspart (novoLOG) injection 0-15 Units  0-15 Units Subcutaneous TID WC Mikey College T, MD   2 Units at 07/07/23 1138   lidocaine (XYLOCAINE) 5 % ointment 1 Application  1 Application Topical BID PRN Emeline General, MD       lidocaine (XYLOCAINE) injection 1 % - NO CHARGE  5 mL Subcutaneous Once Mikey College T, MD       losartan (COZAAR) tablet 25 mg  25 mg Oral Daily Mikey College T, MD   25 mg at 07/07/23 0931   meropenem (MERREM) 2 g in sodium chloride 0.9 % 100 mL IVPB  2 g Intravenous Q12H Effie Shy, RPH       ondansetron [facility]) tablet 4 mg  4 mg Oral Q6H PRN Mikey College T, MD       Or   ondansetron [facility]) injection 4 mg  4 mg Intravenous Q6H PRN Mikey College T, MD       pantoprazole (PROTONIX) EC tablet 40 mg  40 mg Oral Daily Mikey College T, MD   40 mg at 07/07/23 0934   potassium chloride SA (KLOR-CON M) CR tablet 40 mEq  40 mEq Oral Q4H Rosezetta Schlatter T, MD   40 mEq at 07/07/23 1138   rosuvastatin (CRESTOR) tablet 20 mg  20 mg Oral Daily Mikey College T, MD   20 mg at 07/07/23 0932   [START ON 07/08/2023] vancomycin (VANCOCIN) IVPB 1000 mg/200 mL premix  1,000 mg Intravenous Q24H Effie Shy, RPH       vancomycin (VANCOREADY) IVPB 500 mg/100 mL  500 mg Intravenous Once Effie Shy, RPH 100 mL/hr at 07/07/23 1137 500 mg at 07/07/23 1137   Current Outpatient Medications  Medication Sig Dispense Refill   lidocaine (XYLOCAINE) 5 % ointment Apply 1 Application topically 2 (two) times daily as needed. 50 g 0   metFORMIN (GLUCOPHAGE-XR) 500 MG 24 hr tablet Take 1,000 mg by mouth 2 (two) times daily.     traMADol (ULTRAM) 50 MG tablet Take 50 mg by mouth.  1 tablet as needed Orally up to twice a day for shingles pain     clopidogrel (PLAVIX) 75  MG tablet Take 1 tablet (75 mg total) by mouth daily. 30 tablet 11   escitalopram (LEXAPRO) 20 MG tablet TAKE 1 TABLET BY MOUTH ONCE DAILY 90 tablet 0   FARXIGA 10 MG TABS tablet Take 10 mg by mouth daily.     ferrous sulfate 325 (65 FE) MG tablet Take 325 mg by mouth daily with breakfast.     fluticasone (  FLONASE ALLERGY RELIEF) 50 MCG/ACT nasal spray Place 2 sprays into both nostrils as needed. (Patient not taking: Reported on 07/06/2023)     gabapentin (NEURONTIN) 800 MG tablet Take one by mouth at morning . Lunch and 2 at night. 360 tablet 1   losartan (COZAAR) 25 MG tablet Take 25 mg by mouth daily.     Magnesium 400 MG TABS 1 tablet Orally once a day     meclizine (ANTIVERT) 12.5 MG tablet Take 1 tablet (12.5 mg total) by mouth 3 (three) times daily as needed for up to 14 days for dizziness. 30 tablet 0   metaxalone (SKELAXIN) 800 MG tablet Take by mouth. (Patient not taking: Reported on 07/06/2023)     metFORMIN (GLUCOPHAGE) 500 MG tablet Take 1,000 mg by mouth 2 (two) times daily. At bedtime     pantoprazole (PROTONIX) 40 MG tablet Take 40 mg by mouth daily.     rosuvastatin (CRESTOR) 10 MG tablet Take 10 mg by mouth daily. (Patient not taking: Reported on 07/06/2023)     rosuvastatin (CRESTOR) 20 MG tablet Take 1 tablet by mouth daily.     vitamin B-12 (CYANOCOBALAMIN) 1000 MCG tablet Take 1,000 mcg by mouth daily.       Discharge Medications: Please see discharge summary for a list of discharge medications.  Relevant Imaging Results:  Relevant Lab Results:   Additional Information SSN: [id number]  Darolyn Rua, LCSW

## 2023-07-07 NOTE — ED Notes (Signed)
Patient self removed 2nd IV; patient increasingly more agitated and trying to get out of bed, unable to re-direct patient. Neuro made aware.

## 2023-07-07 NOTE — ED Notes (Signed)
Patient assisted on and off bedpan, new depends applied.

## 2023-07-07 NOTE — Progress Notes (Signed)
OT Cancellation Note  Patient Details Name: Theresa Ferguson MRN: [id number] DOB: [birth year]   Cancelled Treatment:    Reason Eval/Treat Not Completed: Medical issues which prohibited therapy (Pt. presents with low potassium 2.4. Will continue to monitor, and evaluate when apprpropriate.)  Olegario Messier, MS, OTR/L 07/07/2023, 8:38 AM

## 2023-07-07 NOTE — ED Notes (Signed)
Assisted on and off bedpan.

## 2023-07-07 NOTE — Progress Notes (Addendum)
Progress Note   Patient: Theresa Ferguson ZOX:[id number] DOB: 06-20-1946 DOA: 07/06/2023     0 DOS: the patient was seen and examined on 07/07/2023   Brief hospital course: Theresa Ferguson is a 77 y.o. female with medical history significant of intermittent vertigo, HTN, HLD, right breast cancer status post lumpectomy and radioactive seed treatment, PVD on Plavix, IIDM, chronic peripheral neuropathy on gabapentin, brought in by family member for evaluation of worsening of mentations.   Patient is confused and agitated, unable to give any history, or history provided by husband at bedside.  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  This time, about 3-4 weeks ago, patient started to have vertigo for 2 days, then patient started develop shingles, started on left 2 fingers and the palm then extended to forearm done upper arm and left shoulder with severe pain.  And about 3 days later, the shingles lesion cross stated and the patient started to feel better however then she developed another episode of vertigo's for 2 to 3 days with severe spinning sensations.  She came to ED 10 days ago, MRI was done which showed no stroke and patient sent home.  Later the spinning sensation also subsided.     About 7 days ago, however the left arm pain became more severe, and she went to see PCP, and was given muscle relaxant and tramadol.  Patient completed muscle relaxant last Friday and only continue tramadol twice daily and last dose was yesterday evening.  Same day, patient started to feel nausea but no vomiting and the last 2-3 days patient has had very little p.o. intake.  According to husband patient denied any vomiting, no diarrhea.  And yesterday patient became intermittently confused, and overnight patient became agitated, which prompted husband to call EMS.  Husband reported that patient was  not given gabapentin since Sunday.   ED Course: Afebrile, tachycardia blood pressure stable nonhypoxic.  CT head negative for acute findings.  Blood work showed K3.0, bicarb 16, creatinine 0.8, VBG 7.39/33/33.  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 EEG read by neurology did not show any seizures Continue telemetry monitoring Diet advanced from n.p.o. today Neurologist on board and case discussed Continue gabapentin Continue as needed Zyprexa for agitation   Left arm and hand shingle -No acute findings, crusted lesion on left hand otherwise no signs of active lesions.   Severe hypokalemia Continue repletion and monitoring  Vertigo continue meclizine    Compensated non-anion gap metabolic acidosis and respiratory alkalosis -Etiology unknown, and the patient had no diarrhea as per family. Monitor BMP closely   IIDM Holding metformin while inpatient Continue insulin therapy   PVD Continue Plavix   HTN Continue losartan     DVT prophylaxis: Lovenox Code Status: Full code Family Communication: Husband at bedside  Disposition Plan: Expect less than 2 midnight hospital stay  Consults called: Neurology  Admission status: Inpatient     Subjective:  Patient seen and examined at bedside this morning Denies nausea vomiting abdominal pain or chest pain Appears confused as confirmed by patient's husband present at bedside  Physical Exam: Eyes: PERRL, lids and conjunctivae normal ENMT: Mucous membranes are moist. Posterior pharynx clear of any exudate or lesions.Normal dentition.  Neck: normal, supple, no masses, no thyromegaly Respiratory: clear  to auscultation bilaterally, no wheezing, no crackles. Normal respiratory effort. No accessory muscle use.  Cardiovascular: Regular rate and rhythm, no murmurs / rubs / gallops. No extremity edema. 2+ pedal pulses. No carotid bruits.  Abdomen: no tenderness, no masses palpated. No hepatosplenomegaly. Bowel sounds positive.  Musculoskeletal: no clubbing / cyanosis. No joint deformity upper and lower extremities. Good ROM, no contractures. Normal muscle tone.  Skin: Crusted small lesion on the left palm Neurologic: Patient appears to be restless.  Awake moving all extremities but confused  Vitals:   07/07/23 1200 07/07/23 1400 07/07/23 1545 07/07/23 1717  BP: (!) 152/73 (!) 143/73  (!) 173/78  Pulse: 88 (!) 104  92  Resp: 20 18    Temp: 97.9 F (36.6 C)  98.2 F (36.8 C) 97.7 F (36.5 C)  TempSrc: Oral  Oral   SpO2: 99% 100%  100%  Weight:      Height:        Data Reviewed:    Latest Ref Rng & Units 07/07/2023    5:47 AM 07/06/2023    5:36 AM 06/24/2023   11:36 AM  BMP  Glucose 70 - 99 mg/dL 295  621  308   BUN 8 - 23 mg/dL 20  19  30    Creatinine 0.44 - 1.00 mg/dL 6.57  8.46  9.62   Sodium 135 - 145 mmol/L 139  139  137   Potassium 3.5 - 5.1 mmol/L 2.4  3.0  3.2   Chloride 98 - 111 mmol/L 115  108  104   CO2 22 - 32 mmol/L 15  16  23    Calcium 8.9 - 10.3 mg/dL 7.4  9.3  9.5        Latest Ref Rng & Units 07/07/2023    5:47 AM 07/06/2023    3:46 AM 06/24/2023   11:36 AM  CBC  WBC 4.0 - 10.5 K/uL 6.4  5.4  6.3   Hemoglobin 12.0 - 15.0 g/dL 9.1  95.2  84.1   Hematocrit 36.0 - 46.0 % 27.2  34.3  32.8   Platelets 150 - 400 K/uL 123  196  169      Disposition: Status is: Patient   Time spent: 57 minutes  Author: Loyce Dys, MD 07/07/2023 6:17 PM  For on call review [email or web address].

## 2023-07-07 NOTE — TOC Initial Note (Signed)
Transition of Care [facility]) - Initial/Assessment Note    Patient Details  Name: Theresa Ferguson MRN: [id number] Date of Birth: 1946-08-21  [facility] CM/SW Contact:    Darolyn Rua, LCSW Phone Number: 07/07/2023, 12:18 PM  Clinical Narrative:                  CSW notes patient is disoriented, spoke with spouse Fayrene Fearing regarding PT recommendations of SNF at time of discharge, he is in agreement and reports his mother was at [facility]. He has a preference for [facility] or [facility], is agreeable for referrals to be sent out for bed offers.   No additional questions or concerns at this time.    Expected Discharge Plan: Skilled Nursing Facility Barriers to Discharge: Continued Medical Work up   Patient Goals and CMS Choice Patient states their goals for this hospitalization and ongoing recovery are:: to go home CMS Medicare.gov Compare Post Acute Care list provided to:: Patient Represenative (must comment) (spouse) Choice offered to / list presented to : Spouse      Expected Discharge Plan and Services       Living arrangements for the past 2 months: Single Family Home                                      Prior Living Arrangements/Services Living arrangements for the past 2 months: Single Family Home Lives with:: Spouse                   Activities of Daily Living   ADL Screening (condition at time of admission) Independently performs ADLs?: No Does the patient have a NEW difficulty with bathing/dressing/toileting/self-feeding that is expected to last >3 days?: Yes (Initiates electronic notice to provider for possible OT consult) Does the patient have a NEW difficulty with getting in/out of bed, walking, or climbing stairs that is expected to last >3 days?: Yes (Initiates electronic notice to provider for possible PT consult) Does the patient have a NEW difficulty with communication that is expected to last >3 days?: Yes (Initiates  electronic notice to provider for possible SLP consult) Is the patient deaf or have difficulty hearing?: No Does the patient have difficulty seeing, even when wearing glasses/contacts?: No Does the patient have difficulty concentrating, remembering, or making decisions?: Yes  Permission Sought/Granted                  Emotional Assessment       Orientation: : Oriented to Self      Admission diagnosis:  AMS (altered mental status) [R41.82] Patient Active Problem List   Diagnosis Date Noted   AMS (altered mental status) 07/06/2023   PAD (peripheral artery disease) (HCC) 12/20/2022   Leg pain 06/30/2022   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   Chronic venous insufficiency 06/24/2022   Diabetes (HCC) 06/24/2022   Hyperlipidemia 06/24/2022   Iron deficiency anemia 04/15/2021   Anxiety 03/26/2021   Goals of care, counseling/discussion 08/19/2020   Malignant neoplasm of upper-outer quadrant of right breast in female, 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   Other seasonal allergic rhinitis 05/13/2015   Restless legs syndrome (RLS) 05/13/2015   Diabetic polyneuropathy associated with diabetes mellitus due to underlying condition (HCC) 05/13/2015   RLS (restless legs syndrome) 02/05/2015   Neuropathy 02/05/2015   DDD (degenerative disc disease), lumbar 02/02/2014   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  Radiculopathy of lumbosacral region 02/02/2013   PCP:  Ollen Bowl, MD Pharmacy:   [address] [place], [place] - [address] [place] [address] Phone: [phone] Fax: [phone]  [facility] - [place], [place] - [address] [place] [address] Phone: [phone] Fax: [phone]  [facility] [place], [place] - [address] [place] [address] Phone: [phone] Fax: [phone]  [id number] [place], [place] - [address] AT [facility] [address]  [address] [place] [address] Phone: [phone] Fax: [phone]     Social Determinants of Health (SDOH) Social History: SDOH Screenings   Food Insecurity: Patient Unable To Answer (07/06/2023)  Housing: Patient Unable To Answer (07/06/2023)  Transportation Needs: Patient Unable To Answer (07/06/2023)  Utilities: Patient Unable To Answer (07/06/2023)  Tobacco Use: Medium Risk (07/06/2023)   SDOH Interventions:     Readmission Risk Interventions     No data to display

## 2023-07-07 NOTE — Consult Note (Addendum)
Pharmacy Note - Acyclovir   Theresa Ferguson is a 77 y.o. female admitted on 07/06/2023 with  confusion and agitation  with fever. Neurology consulted for febrile encephalopathy with no clear indication. LP to rule out CNS infection. CSF had elevated WBC, leukocytes > neutrophils, hazy, glucose 77, protein 49. Patient has reported PCN allergy and no known history of using cephalosporins. Pharmacy has been consulted for acyclovir dosing and vancomycin dosing for meningitis.  Today, 07/07/2023 Day 1 antibiotics Scr 1.09 >> 0.87 11/5 CT Head: no acute abnormalities  LP concerning for infection (viral vs bacterial) Diagnosed with shingles 06/19/23 was given Valtrex 1,000mg  to take BID for 7 days  Meningitis panel pending  Plan: Changed acyclovir 660 mg Q8H to Q12H Changed meropenem 1g Q8H to Q12H Changed Vancomycin 500 mg Q12H to 1000 mg Q24H Goal trough > 15 Follow that patient remain on fluids while receiving acyclovir  Currently 125 mL/hr continuous 0.9% NS Follow culture results and monitor decreasing renal function  PCN Allergy Given patient's documented severe allergy to penicillins, no use of cephalosporins in the past, and conversation with MD, will start patient on meropenem 2 g IV Q8H Currently, patient is confused and cannot be asked further questions about her penicillin allergy history.  Height: 5\' 5"  (165.1 cm) Weight: 66.3 kg (146 lb 2.6 oz) IBW/kg (Calculated) : 57  Temp (24hrs), Avg:99.3 F (37.4 C), Min:97.6 F (36.4 C), Max:101.4 F (38.6 C)  Recent Labs  Lab 07/06/23 0346 07/06/23 0421 07/06/23 0536 07/07/23 0547  WBC 5.4  --   --  6.4  CREATININE  --   --  0.87 1.09*  LATICACIDVEN  --  1.6  --   --     Estimated Creatinine Clearance: 39.5 mL/min (A) (by C-G formula based on SCr of 1.09 mg/dL (H)).    Allergies  Allergen Reactions   Codeine    Latex    Morphine And Codeine    Nickel    Penicillins    Atorvastatin Other (See Comments)   Cerivastatin  Other (See Comments)   Fluvastatin Other (See Comments)    Antimicrobials this admission: Acyclovir 11/5 >>  Vancomycin 11/5>> Meropenem 11/5>>  Potential Neurotoxic agents Losartan 25 mg daily   Dose adjustments this admission: Acyclovir 660 mg Q8H to Q12H Vancomycin 500 mg Q12H to 1000 mg Q24H   Microbiology results: 11/5 meningitis panel: in process 11/5 CSF culture/gram stain: no organisms seen 11/5 Bcx: NGTD 11/5 Fungal: in process  Thank you for allowing pharmacy to be a part of this patient's care.  Effie Shy, PharmD Pharmacy Resident  07/07/2023 11:03 AM

## 2023-07-08 ENCOUNTER — Inpatient Hospital Stay: Payer: Medicare Other

## 2023-07-08 DIAGNOSIS — M47812 Spondylosis without myelopathy or radiculopathy, cervical region: Secondary | ICD-10-CM | POA: Diagnosis not present

## 2023-07-08 DIAGNOSIS — G0490 Encephalitis and encephalomyelitis, unspecified: Secondary | ICD-10-CM

## 2023-07-08 DIAGNOSIS — M50323 Other cervical disc degeneration at C6-C7 level: Secondary | ICD-10-CM | POA: Diagnosis not present

## 2023-07-08 DIAGNOSIS — E1151 Type 2 diabetes mellitus with diabetic peripheral angiopathy without gangrene: Secondary | ICD-10-CM | POA: Diagnosis present

## 2023-07-08 DIAGNOSIS — G9340 Encephalopathy, unspecified: Secondary | ICD-10-CM

## 2023-07-08 DIAGNOSIS — R29898 Other symptoms and signs involving the musculoskeletal system: Secondary | ICD-10-CM | POA: Diagnosis not present

## 2023-07-08 DIAGNOSIS — Z87891 Personal history of nicotine dependence: Secondary | ICD-10-CM | POA: Diagnosis not present

## 2023-07-08 DIAGNOSIS — D729 Disorder of white blood cells, unspecified: Secondary | ICD-10-CM

## 2023-07-08 DIAGNOSIS — E876 Hypokalemia: Secondary | ICD-10-CM | POA: Diagnosis present

## 2023-07-08 DIAGNOSIS — G992 Myelopathy in diseases classified elsewhere: Secondary | ICD-10-CM | POA: Diagnosis present

## 2023-07-08 DIAGNOSIS — Z885 Allergy status to narcotic agent status: Secondary | ICD-10-CM | POA: Diagnosis not present

## 2023-07-08 DIAGNOSIS — B021 Zoster meningitis: Secondary | ICD-10-CM | POA: Diagnosis not present

## 2023-07-08 DIAGNOSIS — Z888 Allergy status to other drugs, medicaments and biological substances status: Secondary | ICD-10-CM | POA: Diagnosis not present

## 2023-07-08 DIAGNOSIS — M50322 Other cervical disc degeneration at C5-C6 level: Secondary | ICD-10-CM | POA: Diagnosis not present

## 2023-07-08 DIAGNOSIS — G0491 Myelitis, unspecified: Secondary | ICD-10-CM | POA: Diagnosis not present

## 2023-07-08 DIAGNOSIS — K219 Gastro-esophageal reflux disease without esophagitis: Secondary | ICD-10-CM | POA: Diagnosis present

## 2023-07-08 DIAGNOSIS — E1142 Type 2 diabetes mellitus with diabetic polyneuropathy: Secondary | ICD-10-CM | POA: Diagnosis present

## 2023-07-08 DIAGNOSIS — G9341 Metabolic encephalopathy: Secondary | ICD-10-CM | POA: Diagnosis present

## 2023-07-08 DIAGNOSIS — R4182 Altered mental status, unspecified: Secondary | ICD-10-CM | POA: Diagnosis not present

## 2023-07-08 DIAGNOSIS — G2581 Restless legs syndrome: Secondary | ICD-10-CM | POA: Diagnosis present

## 2023-07-08 DIAGNOSIS — Z91040 Latex allergy status: Secondary | ICD-10-CM | POA: Diagnosis not present

## 2023-07-08 DIAGNOSIS — Z853 Personal history of malignant neoplasm of breast: Secondary | ICD-10-CM | POA: Diagnosis not present

## 2023-07-08 DIAGNOSIS — Z79899 Other long term (current) drug therapy: Secondary | ICD-10-CM | POA: Diagnosis not present

## 2023-07-08 DIAGNOSIS — M4802 Spinal stenosis, cervical region: Secondary | ICD-10-CM | POA: Diagnosis present

## 2023-07-08 DIAGNOSIS — Z7984 Long term (current) use of oral hypoglycemic drugs: Secondary | ICD-10-CM | POA: Diagnosis not present

## 2023-07-08 DIAGNOSIS — M50321 Other cervical disc degeneration at C4-C5 level: Secondary | ICD-10-CM | POA: Diagnosis not present

## 2023-07-08 DIAGNOSIS — A4189 Other specified sepsis: Secondary | ICD-10-CM | POA: Diagnosis present

## 2023-07-08 DIAGNOSIS — Z880 Allergy status to penicillin: Secondary | ICD-10-CM | POA: Diagnosis not present

## 2023-07-08 DIAGNOSIS — I10 Essential (primary) hypertension: Secondary | ICD-10-CM | POA: Diagnosis present

## 2023-07-08 DIAGNOSIS — Z91048 Other nonmedicinal substance allergy status: Secondary | ICD-10-CM | POA: Diagnosis not present

## 2023-07-08 DIAGNOSIS — E872 Acidosis, unspecified: Secondary | ICD-10-CM | POA: Diagnosis not present

## 2023-07-08 DIAGNOSIS — E874 Mixed disorder of acid-base balance: Secondary | ICD-10-CM | POA: Diagnosis present

## 2023-07-08 DIAGNOSIS — B020 Zoster encephalitis: Secondary | ICD-10-CM | POA: Diagnosis present

## 2023-07-08 DIAGNOSIS — Z923 Personal history of irradiation: Secondary | ICD-10-CM | POA: Diagnosis not present

## 2023-07-08 DIAGNOSIS — Z7902 Long term (current) use of antithrombotics/antiplatelets: Secondary | ICD-10-CM | POA: Diagnosis not present

## 2023-07-08 DIAGNOSIS — E785 Hyperlipidemia, unspecified: Secondary | ICD-10-CM | POA: Diagnosis present

## 2023-07-08 DIAGNOSIS — G959 Disease of spinal cord, unspecified: Secondary | ICD-10-CM | POA: Diagnosis not present

## 2023-07-08 LAB — BASIC METABOLIC PANEL
Anion gap: 5 (ref 5–15)
BUN: 21 mg/dL (ref 8–23)
CO2: 17 mmol/L — ABNORMAL LOW (ref 22–32)
Calcium: 8.6 mg/dL — ABNORMAL LOW (ref 8.9–10.3)
Chloride: 122 mmol/L — ABNORMAL HIGH (ref 98–111)
Creatinine, Ser: 1.07 mg/dL — ABNORMAL HIGH (ref 0.44–1.00)
GFR, Estimated: 54 mL/min — ABNORMAL LOW (ref >60)
Glucose, Bld: 113 mg/dL — ABNORMAL HIGH (ref 70–99)
Potassium: 3.9 mmol/L (ref 3.5–5.1)
Sodium: 144 mmol/L (ref 135–145)

## 2023-07-08 LAB — GLUCOSE, CAPILLARY
Glucose-Capillary: 197 mg/dL — ABNORMAL HIGH (ref 70–99)
Glucose-Capillary: 116 mg/dL — ABNORMAL HIGH (ref 70–99)
Glucose-Capillary: 145 mg/dL — ABNORMAL HIGH (ref 70–99)

## 2023-07-08 LAB — CBC WITH DIFFERENTIAL/PLATELET
Abs Immature Granulocytes: 0.01 10*3/uL (ref 0.00–0.07)
Basophils Absolute: 0 10*3/uL (ref 0.0–0.1)
Basophils Relative: 0 %
Eosinophils Absolute: 0.1 10*3/uL (ref 0.0–0.5)
Eosinophils Relative: 2 %
HCT: 23.8 % — ABNORMAL LOW (ref 36.0–46.0)
Hemoglobin: 8.3 g/dL — ABNORMAL LOW (ref 12.0–15.0)
Immature Granulocytes: 0 %
Lymphocytes Relative: 20 %
Lymphs Abs: 0.9 10*3/uL (ref 0.7–4.0)
MCH: 33.2 pg (ref 26.0–34.0)
MCHC: 34.9 g/dL (ref 30.0–36.0)
MCV: 95.2 fL (ref 80.0–100.0)
Monocytes Absolute: 0.5 10*3/uL (ref 0.1–1.0)
Monocytes Relative: 10 %
Neutro Abs: 3.1 10*3/uL (ref 1.7–7.7)
Neutrophils Relative %: 68 %
Platelets: 128 10*3/uL — ABNORMAL LOW (ref 150–400)
RBC: 2.5 MIL/uL — ABNORMAL LOW (ref 3.87–5.11)
RDW: 14.4 % (ref 11.5–15.5)
WBC: 4.5 10*3/uL (ref 4.0–10.5)
nRBC: 0 % (ref 0.0–0.2)

## 2023-07-08 LAB — VDRL, CSF: VDRL Quant, CSF: NONREACTIVE

## 2023-07-08 MED ORDER — GADOBUTROL 1 MMOL/ML IV SOLN
6.0000 mL | Freq: Once | INTRAVENOUS | Status: AC | PRN
  Administered 2023-07-08: 6 mL via INTRAVENOUS

## 2023-07-08 NOTE — Progress Notes (Signed)
Subjective: Continues to improve  Exam: Vitals:   07/07/23 2110 07/08/23 0141  BP: (!) 162/85 (!) 143/64  Pulse: 87 84  Resp: 17 18  Temp: 98.4 F (36.9 C) 97.7 F (36.5 C)  SpO2: 100% 98%   Gen: In bed, NAD Resp: non-labored breathing, no acute distress Abd: soft, nt  Neuro: MS: Awake, alert, speech is coherent.  She is able to name the visitors in the room without difficulty.  She does have difficulty with world backwards "D-L-O-R-D" CN: Pupils equal round and reactive, EOMI visual fields full Motor: No drift in either upper extremity, but she does have interossei weakness on the left Sensory: She has some numbness in the C8 distribution versus distal ulnar distribution on the left   Pertinent Labs: Results for orders placed or performed during the hospital encounter of 07/06/23 (from the past 24 hour(s))  CBG monitoring, ED     Status: Abnormal   Collection Time: 07/07/23 11:22 AM  Result Value Ref Range   Glucose-Capillary 132 (H) 70 - 99 mg/dL  Glucose, capillary     Status: None   Collection Time: 07/07/23  5:26 PM  Result Value Ref Range   Glucose-Capillary 91 70 - 99 mg/dL  Potassium     Status: None   Collection Time: 07/07/23  7:10 PM  Result Value Ref Range   Potassium 3.5 3.5 - 5.1 mmol/L  CBC with Differential/Platelet     Status: Abnormal   Collection Time: 07/08/23  4:05 AM  Result Value Ref Range   WBC 4.5 4.0 - 10.5 K/uL   RBC 2.50 (L) 3.87 - 5.11 MIL/uL   Hemoglobin 8.3 (L) 12.0 - 15.0 g/dL   HCT 16.1 (L) 09.6 - 04.5 %   MCV 95.2 80.0 - 100.0 fL   MCH 33.2 26.0 - 34.0 pg   MCHC 34.9 30.0 - 36.0 g/dL   RDW 40.9 81.1 - 91.4 %   Platelets 128 (L) 150 - 400 K/uL   nRBC 0.0 0.0 - 0.2 %   Neutrophils Relative % 68 %   Neutro Abs 3.1 1.7 - 7.7 K/uL   Lymphocytes Relative 20 %   Lymphs Abs 0.9 0.7 - 4.0 K/uL   Monocytes Relative 10 %   Monocytes Absolute 0.5 0.1 - 1.0 K/uL   Eosinophils Relative 2 %   Eosinophils Absolute 0.1 0.0 - 0.5 K/uL    Basophils Relative 0 %   Basophils Absolute 0.0 0.0 - 0.1 K/uL   Immature Granulocytes 0 %   Abs Immature Granulocytes 0.01 0.00 - 0.07 K/uL  Basic metabolic panel     Status: Abnormal   Collection Time: 07/08/23  4:05 AM  Result Value Ref Range   Sodium 144 135 - 145 mmol/L   Potassium 3.9 3.5 - 5.1 mmol/L   Chloride 122 (H) 98 - 111 mmol/L   CO2 17 (L) 22 - 32 mmol/L   Glucose, Bld 113 (H) 70 - 99 mg/dL   BUN 21 8 - 23 mg/dL   Creatinine, Ser 7.82 (H) 0.44 - 1.00 mg/dL   Calcium 8.6 (L) 8.9 - 10.3 mg/dL   GFR, Estimated 54 (L) >60 mL/min   Anion gap 5 5 - 15   Ammonia on admission 10 UA - negative UDS - negative TSH - 1.18 Procalcitonin < 0.1 VZV skin scrping - negative.   CSF WBC 42 CSF RBC 62 CSF protein 49 CSF glucose 77 Meningoencephalitis panel-negative Autoimmune encephalitis panel on CSF - negative   Impression: 77 year old female  who presents with progressive encephalopathy following a 3-week episode concerning for shingles.  She also has had two episodes of vertigo in that timeframe.  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  Autoimmune encephalitis following herpes infections can occur, and may need to be consideration.  The fact that she was febrile, however, continues giving me some pause.  Her significant improvement I think makes both autoimmune and VZV encephalitis significantly less likely. She is complaining of some ulner vs c8 area numbness, and I think a contrasted C-spine MRI to look for nerve root enhancement could aid in explaining her   Recommendations: 1) f/u autoimmune encephalitis panel 2) VZV PCR in CSF 3) MRI c-spine w/wo contrast 4) neurology will continue to follow.  Ritta Slot, MD [facility] [phone]  If 7pm- 7am, please page neurology on call as listed in AMION.

## 2023-07-08 NOTE — Evaluation (Signed)
Occupational Therapy Evaluation Patient Details Name: Theresa Ferguson MRN: [id number] DOB: December 13, 1945 Today's Date: 07/08/2023   History of Present Illness Theresa Ferguson is a 77 y.o. female with medical history significant of intermittent vertigo, HTN, HLD, right breast cancer status post lumpectomy and radioactive seed treatment, PVD on Plavix, IIDM, chronic peripheral neuropathy on gabapentin, brought in by family member for evaluation of worsening of mentations.   Clinical Impression   Pt was seen for OT evaluation this date. Prior to hospital admission, pt was living at home with her husband and IND up until recently when she had increased dizziness per husband report.  Pt presents to acute OT demonstrating impaired ADL performance and functional mobility 2/2 weakness, balance deficits, coordination and sensation deficits to LUE (See OT problem list for additional functional deficits). Pt currently requires SUP for supine to sit at EOB. She required CGA/MIN A for STS from EOB, however is very unsteady and needed Min/Mod A to SPT to the recliner. Pt reporting numbness to L hand/digits with notable coordination deficits with finger to nose and opposition. Able to pick up tv remote and hold phone, noted to drop thin paper menu. Pt was oriented x4 today which is greatly improved from the last few days. Pt would benefit from skilled OT services to address noted impairments and functional limitations (see below for any additional details) in order to maximize safety and independence while minimizing falls risk and caregiver burden. Do anticipate/Anticipate the need for follow up OT services upon acute hospital DC.        If plan is discharge home, recommend the following: A lot of help with walking and/or transfers;Assistance with cooking/housework;Assist for transportation;Supervision due to cognitive status;A little help with bathing/dressing/bathroom    Functional Status Assessment  Patient has  had a recent decline in their functional status and demonstrates the ability to make significant improvements in function in a reasonable and predictable amount of time.  Equipment Recommendations  Other (comment) (defer)    Recommendations for Other Services       Precautions / Restrictions Restrictions Weight Bearing Restrictions: No      Mobility Bed Mobility Overal bed mobility: Needs Assistance Bed Mobility: Supine to Sit     Supine to sit: Supervision, HOB elevated          Transfers Overall transfer level: Needs assistance Equipment used: 1 person hand held assist Transfers: Sit to/from Stand Sit to Stand: Mod assist, Min assist           General transfer comment: Min/Mod A to maintain balance to SPT from bed to recliner with HHA x1 d/t unsteadiness      Balance Overall balance assessment: Needs assistance Sitting-balance support: Feet supported Sitting balance-Leahy Scale: Good     Standing balance support: Single extremity supported, During functional activity Standing balance-Leahy Scale: Poor Standing balance comment: very unsteady in standing seemingly leaning more to the left                           ADL either performed or assessed with clinical judgement   ADL Overall ADL's : Needs assistance/impaired                 Upper Body Dressing : Minimal assistance       Toilet Transfer: Moderate assistance;Minimal assistance Toilet Transfer Details (indicate cue type and reason): simulated from bed to recliner with Min/Mod A d/t unsteadiness  Vision         Perception         Praxis         Pertinent Vitals/Pain Pain Assessment Pain Assessment: No/denies pain     Extremity/Trunk Assessment Upper Extremity Assessment Upper Extremity Assessment: LUE deficits/detail;Generalized weakness LUE Deficits / Details: decreased LUE strength, sensation and FMC LUE Sensation: decreased light touch LUE  Coordination: decreased fine motor       Cervical / Trunk Assessment Cervical / Trunk Assessment: Normal   Communication Communication Communication: No apparent difficulties   Cognition Arousal: Alert Behavior During Therapy: Impulsive Overall Cognitive Status: Impaired/Different from baseline                   Orientation Level: Person, Place, Time, Situation     Following Commands: Follows one step commands consistently Safety/Judgement: Decreased awareness of safety     General Comments: oriented x4 today, pleasant, talkative and agreeable to OT session     General Comments       Exercises     Shoulder Instructions      Home Living Family/patient expects to be discharged to:: Private residence Living Arrangements: Spouse/significant other Available Help at Discharge: Family Type of Home: House Home Access: Level entry     Home Layout: Able to live on main level with bedroom/bathroom               Home Equipment: Agricultural consultant (2 wheels)          Prior Functioning/Environment Prior Level of Function : Independent/Modified Independent             Mobility Comments: patient does not normally use Ad, she had been using a RW recently due to increased dizziness ( all info provided by spouse) ADLs Comments: Husband assists as needed        OT Problem List: Decreased coordination;Impaired sensation;Pain;Decreased safety awareness;Impaired balance (sitting and/or standing);Decreased strength      OT Treatment/Interventions: Self-care/ADL training;Therapeutic exercise;Patient/family education;Balance training;Therapeutic activities;Energy conservation;DME and/or AE instruction    OT Goals(Current goals can be found in the care plan section) Acute Rehab OT Goals Patient Stated Goal: return home OT Goal Formulation: With patient Time For Goal Achievement: 07/22/23 Potential to Achieve Goals: Good ADL Goals Pt Will Perform Lower Body  Bathing: sit to/from stand;sitting/lateral leans;with contact guard assist Pt Will Perform Lower Body Dressing: with contact guard assist;sit to/from stand;sitting/lateral leans Pt Will Transfer to Toilet: with contact guard assist;ambulating;regular height toilet;grab bars Pt Will Perform Toileting - Clothing Manipulation and hygiene: with contact guard assist;sitting/lateral leans;sit to/from stand  OT Frequency: Min 1X/week    Co-evaluation              AM-PAC OT "6 Clicks" Daily Activity     Outcome Measure Help from another person eating meals?: None Help from another person taking care of personal grooming?: None Help from another person toileting, which includes using toliet, bedpan, or urinal?: A Little Help from another person bathing (including washing, rinsing, drying)?: A Lot Help from another person to put on and taking off regular upper body clothing?: A Little Help from another person to put on and taking off regular lower body clothing?: A Lot 6 Click Score: 18   End of Session Nurse Communication: Mobility status  Activity Tolerance: Patient tolerated treatment well Patient left: in chair;with chair alarm set;with nursing/sitter in room;with family/visitor present  OT Visit Diagnosis: Unsteadiness on feet (R26.81);Other abnormalities of gait and mobility (R26.89);Muscle weakness (generalized) (M62.81)  Time: 1610-9604 OT Time Calculation (min): 31 min Charges:  OT General Charges $OT Visit: 1 Visit OT Evaluation $OT Eval Moderate Complexity: 1 Mod OT Treatments $Therapeutic Activity: 8-22 mins Mailey Landstrom, OTR/L 07/08/23, 1:14 PM  Ripley Bogosian E Chandel Zaun 07/08/2023, 1:08 PM

## 2023-07-08 NOTE — Progress Notes (Signed)
PT Cancellation Note  Patient Details Name: Theresa Ferguson MRN: [id number] DOB: 09-23-1945   Cancelled Treatment:    Reason Eval/Treat Not Completed: Patient at procedure or test/unavailable Patient leaving with transport on arrival. Will re-attempt PT later time/date.   [name] 07/08/2023, 1:38 PM

## 2023-07-08 NOTE — Progress Notes (Signed)
Date of Admission:  07/06/2023     ID: Theresa Ferguson is a 77 y.o. female  Principal Problem:   AMS (altered mental status)  Son and husband at bed side Theresa Ferguson is a 77 y.o. with a history of ca breast rt s/p lumpectomy, adjuvant radiation therapy and arimedex until 2022, anemia,has been unwell for the past 3 weeks.  Patient had gone to urgent care on 06/13/2023 complaining of dizziness of 2 days duration bordering on vertigo.  She had taken meclizine with some benefit.  They diagnosed her with labyrinthitis versus acute suppurative otitis media of the right ear and gave a prednisone, azithromycin and meclizine.  She returned to the [facility] urgent care on 06/17/2023 complaining of left-sided neck pain and spasms of the neck going down is her shoulder arm and hand.  An x-ray was done that was consistent with cervical radiculopathy.  She was prescribed Skelaxin and prednisone .  On 06/19/2023 she went to the ED complaining of a rash on the left arm which was painful and vesicular we diagnosed her with shingles and gave Valtrex 1 g twice a day for 7 days, gabapentin.  On 06/24/2023 she returned to the ED complaining of vertigo and inner ear issues.  She also complained of gait instability and dysmetria and a concern for central vertigo was raised and she got MRI of the brain which was negative for stroke.  She was discharged from the ED.  On 07/06/2023 patient return to the ED at around 3:30 AM having weakness altered mental status Nausea and poor appetite and not able to eat for the past couple of weeks. As per husband she was confused and sleeping the whole time Subjective: Pt is awake and alert Feeling better She does not remember what happened for 2 days Says she lost time  Medications:   enoxaparin (LOVENOX) injection  40 mg Subcutaneous Q24H   escitalopram  20 mg Oral Daily   gabapentin  800 mg Oral BID   And   gabapentin  1,600 mg Oral QHS   insulin aspart  0-15 Units  Subcutaneous TID WC   lidocaine 1 %  5 mL Subcutaneous Once   losartan  25 mg Oral Daily   pantoprazole  40 mg Oral Daily   rosuvastatin  20 mg Oral Daily    Objective: Vital signs in last 24 hours: Patient Vitals for the past 24 hrs:  BP Temp Temp src Pulse Resp SpO2  07/08/23 0141 (!) 143/64 97.7 F (36.5 C) -- 84 18 98 %  07/07/23 2110 (!) 162/85 98.4 F (36.9 C) Oral 87 17 100 %  07/07/23 1717 (!) 173/78 97.7 F (36.5 C) -- 92 -- 100 %  07/07/23 1545 -- 98.2 F (36.8 C) Oral -- -- --     PHYSICAL EXAM:  General: Alert, cooperative, no distress, appears stated age.  Lungs: Clear to auscultation bilaterally. No Wheezing or Rhonchi. No rales. Heart: Regular rate and rhythm, no murmur, rub or gallop. Abdomen: Soft, non-tender,not distended. Bowel sounds normal. No masses Extremities: atraumatic, no cyanosis. No edema. No clubbing Skin: left palm- scab seen  Lymph: Cervical, supraclavicular normal. Neurologic: left upper extremity- nose finger test- some past pointing Lower extremities heel- shin okay Need to see her walk to assess her cerebellar function  Lab Results    Latest Ref Rng & Units 07/08/2023    4:05 AM 07/07/2023    5:47 AM 07/06/2023    3:46 AM  CBC  WBC 4.0 - 10.5 K/uL 4.5  6.4  5.4   Hemoglobin 12.0 - 15.0 g/dL 8.3  9.1  40.9   Hematocrit 36.0 - 46.0 % 23.8  27.2  34.3   Platelets 150 - 400 K/uL 128  123  196        Latest Ref Rng & Units 07/08/2023    4:05 AM 07/07/2023    7:10 PM 07/07/2023    5:47 AM  CMP  Glucose 70 - 99 mg/dL 811   914   BUN 8 - 23 mg/dL 21   20   Creatinine 7.82 - 1.00 mg/dL 9.56   2.13   Sodium 086 - 145 mmol/L 144   139   Potassium 3.5 - 5.1 mmol/L 3.9  3.5  2.4   Chloride 98 - 111 mmol/L 122   115   CO2 22 - 32 mmol/L 17   15   Calcium 8.9 - 10.3 mg/dL 8.6   7.4       Microbiology: [facility]- NG CSF Culture NG so far  Studies/Results: EEG adult  Result Date: 07/07/2023 Rejeana Brock, MD     07/07/2023  3:35 PM  History: 77 year old with encephalopathy Sedation: None Patient State: Awake and drowsy Technique: This EEG was acquired with electrodes placed according to the International 10-20 electrode system (including Fp1, Fp2, F3, F4, C3, C4, P3, P4, O1, O2, T3, T4, T5, T6, A1, A2, Fz, Cz, Pz). The following electrodes were missing or displaced: none. Background: The background is fairly disorganized and consists predominantly of generalized irregular delta and theta range activities.  There is a very poorly sustained posterior dominant rhythm of 9 Hz that is seen at times.  There is no epileptiform discharge seen. Photic stimulation: Physiologic driving is now performed EEG Abnormalities: 1) generalized irregular slow activity Clinical Interpretation: This EEG is consistent with a generalized non-specific cerebral dysfunction(encephalopathy). There was no seizure or seizure predisposition recorded on this study. Please note that lack of epileptiform activity on EEG does not preclude the possibility of epilepsy. Ritta Slot, MD [facility] [phone] If 7pm- 7am, please page neurology on call as listed in AMION.  MR BRAIN WO CONTRAST  Result Date: 07/06/2023 CLINICAL DATA:  Altered mental status, weakness EXAM: MRI HEAD WITHOUT CONTRAST TECHNIQUE: Multiplanar, multiecho pulse sequences of the brain and surrounding structures were obtained without intravenous contrast. COMPARISON:  06/25/2023 MRI head, correlation is also made with 07/06/2023 CT head FINDINGS: Evaluation is somewhat limited by motion artifact. Brain: No restricted diffusion to suggest acute or subacute infarct. No acute hemorrhage, mass, mass effect, or midline shift. No hydrocephalus or extra-axial collection. Pituitary and craniocervical junction within normal limits. No hemosiderin deposition to suggest remote hemorrhage. Confluent and scattered T2 hyperintense signal in the periventricular white matter, likely the sequela of  moderate chronic small vessel ischemic disease. Vascular: Normal arterial flow voids. Skull and upper cervical spine: Normal marrow signal. Sinuses/Orbits: Clear paranasal sinuses. No acute finding in the orbits. Status post bilateral lens replacements. Other: The mastoid air cells are well aerated. IMPRESSION: No acute intracranial process. No evidence of acute or subacute infarct. Electronically Signed   By: Wiliam Ke M.D.   On: 07/06/2023 19:05   DG FL GUIDED LUMBAR PUNCTURE  Result Date: 07/06/2023 CLINICAL DATA:  77 year old female. History of altered mental status. Team is requesting lumbar puncture for further evaluation. EXAM: LUMBAR PUNCTURE UNDER FLUOROSCOPY PROCEDURE: An appropriate skin entry site was determined fluoroscopically. Operator donned sterile gloves and mask. Skin site was  marked, then prepped with Betadine, draped in usual sterile fashion, and infiltrated locally with 1% lidocaine. A 22-gauge spinal needle advanced into the thecal sac at L5-S1 using an interlaminar approach. 12 ml CSF were collected and divided among 4 sterile vials for the requested laboratory studies. The needle was then removed. The patient tolerated the procedure well and there were no complications. FLUOROSCOPY: Radiation Exposure Index (as provided by the fluoroscopic device): 22.1 mGy Kerma IMPRESSION: Technically successful lumbar puncture under fluoroscopy. This exam was performed by Anders Grant NP and was directly supervised and interpreted by Dr. Olive Bass Electronically Signed   By: Olive Bass M.D.   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 D.D NMDAR encephaiiits R/o carcinomatosis  meningis  Left c5/c6 dermatome involvement? Continue acyclovir IV until VZV PCR is back- may switch to PO valtrex MRI cervical spine pending   H/o Ca breast. S/p lumpectomy, radiation and had taken Arimedex   HTN on losartan   HLD on atorvastatin  Discussed the management with patient and family

## 2023-07-08 NOTE — Progress Notes (Addendum)
Progress Note   Patient: Theresa Ferguson DOB: 03-05-1946 DOA: 07/06/2023     0 DOS: the patient was seen and examined on 07/08/2023     Brief hospital course: Theresa Ferguson is a 77 y.o. female with medical history significant of intermittent vertigo, HTN, HLD, right breast cancer status post lumpectomy and radioactive seed treatment, PVD on Plavix, IIDM, chronic peripheral neuropathy on gabapentin, brought in by family member for evaluation of worsening of mentations.   ED Course: Afebrile, tachycardia blood pressure stable nonhypoxic.  CT head negative for acute findings.  Blood work showed K3.0, bicarb 16, creatinine 0.8, VBG 7.39/33/33.   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 EEG read by neurology did not show any seizures Continue telemetry monitoring Diet advanced from n.p.o. today I have discussed the case with neurologist Continue gabapentin Continue as needed Zyprexa for agitation   Left arm and hand shingle -No acute findings, crusted lesion on left hand otherwise no signs of active lesions.   Severe hypokalemia Continue repletion and monitoring   Vertigo Continue meclizine   Compensated non-anion gap metabolic acidosis and respiratory alkalosis -Etiology unknown, and the patient had no diarrhea as per family. Monitor BMP closely   IIDM Holding metformin while inpatient Continue insulin therapy   PVD Continue Plavix   HTN Continue losartan     DVT prophylaxis: Lovenox Code Status: Full code Family Communication: Husband at bedside   Disposition Plan: Expect less than 2  midnight hospital stay   Consults called: Neurology   Admission status: Inpatient   Subjective:  Patient seen and examined at bedside this morning Denies nausea vomiting abdominal pain or chest pain Appears confused as confirmed by patient's husband present at bedside   Physical Exam: Eyes: PERRL, lids and conjunctivae normal ENMT: Mucous membranes are moist. Posterior pharynx clear of any exudate or lesions.Normal dentition.  Neck: normal, supple, no masses, no thyromegaly Respiratory: clear to auscultation bilaterally, no wheezing, no crackles. Normal respiratory effort. No accessory muscle use.  Cardiovascular: Regular rate and rhythm, no murmurs / rubs / gallops. No extremity edema. 2+ pedal pulses. No carotid bruits.  Abdomen: no tenderness, no masses palpated. No hepatosplenomegaly. Bowel sounds positive.  Musculoskeletal: no clubbing / cyanosis. No joint deformity upper and lower extremities. Good ROM, no contractures. Normal muscle tone.  Skin: Crusted small lesion on the left palm Neurologic: Confusion is much improved Data Reviewed:     Disposition: Status is: Patient     Time spent: 45 minutes  Vitals:   07/07/23 1545 07/07/23 1717 07/07/23 2110 07/08/23 0141  BP:  (!) 173/78 (!) 162/85 (!) 143/64  Pulse:  92 87 84  Resp:   17 18  Temp: 98.2 F (36.8 C) 97.7 F (36.5 C) 98.4 F (36.9 C) 97.7 F (36.5 C)  TempSrc: Oral  Oral   SpO2:  100% 100% 98%  Weight:      Height:          Latest Ref Rng & Units 07/08/2023    4:05 AM 07/07/2023    7:10 PM 07/07/2023    5:47 AM  BMP  Glucose 70 - 99 mg/dL 811   914  BUN 8 - 23 mg/dL 21   20   Creatinine 5.95 - 1.00 mg/dL 6.38   7.56   Sodium 433 - 145 mmol/L 144   139   Potassium 3.5 - 5.1 mmol/L 3.9  3.5  2.4   Chloride 98 - 111 mmol/L 122   115   CO2 22 - 32 mmol/L 17   15   Calcium 8.9 - 10.3 mg/dL 8.6   7.4        Latest Ref Rng & Units 07/08/2023    4:05 AM 07/07/2023    5:47 AM 07/06/2023    3:46 AM  CBC   WBC 4.0 - 10.5 K/uL 4.5  6.4  5.4   Hemoglobin 12.0 - 15.0 g/dL 8.3  9.1  29.5   Hematocrit 36.0 - 46.0 % 23.8  27.2  34.3   Platelets 150 - 400 K/uL 128  123  196      Author: Loyce Dys, MD 07/08/2023 3:08 PM  For on call review [email or web address].

## 2023-07-09 DIAGNOSIS — D729 Disorder of white blood cells, unspecified: Secondary | ICD-10-CM | POA: Diagnosis not present

## 2023-07-09 DIAGNOSIS — G0491 Myelitis, unspecified: Secondary | ICD-10-CM | POA: Diagnosis not present

## 2023-07-09 DIAGNOSIS — G9340 Encephalopathy, unspecified: Secondary | ICD-10-CM | POA: Diagnosis not present

## 2023-07-09 DIAGNOSIS — R4182 Altered mental status, unspecified: Secondary | ICD-10-CM | POA: Diagnosis not present

## 2023-07-09 DIAGNOSIS — R29898 Other symptoms and signs involving the musculoskeletal system: Secondary | ICD-10-CM | POA: Diagnosis not present

## 2023-07-09 LAB — BASIC METABOLIC PANEL
Anion gap: 7 (ref 5–15)
BUN: 19 mg/dL (ref 8–23)
CO2: 20 mmol/L — ABNORMAL LOW (ref 22–32)
Calcium: 8.5 mg/dL — ABNORMAL LOW (ref 8.9–10.3)
Chloride: 109 mmol/L (ref 98–111)
Creatinine, Ser: 0.97 mg/dL (ref 0.44–1.00)
GFR, Estimated: >60 mL/min (ref >60)
Glucose, Bld: 109 mg/dL — ABNORMAL HIGH (ref 70–99)
Potassium: 3.6 mmol/L (ref 3.5–5.1)
Sodium: 139 mmol/L (ref 135–145)

## 2023-07-09 LAB — CBC WITH DIFFERENTIAL/PLATELET
Abs Immature Granulocytes: 0.02 10*3/uL (ref 0.00–0.07)
Basophils Absolute: 0 10*3/uL (ref 0.0–0.1)
Basophils Relative: 1 %
Eosinophils Absolute: 0.1 10*3/uL (ref 0.0–0.5)
Eosinophils Relative: 3 %
HCT: 24.8 % — ABNORMAL LOW (ref 36.0–46.0)
Hemoglobin: 8.2 g/dL — ABNORMAL LOW (ref 12.0–15.0)
Immature Granulocytes: 1 %
Lymphocytes Relative: 37 %
Lymphs Abs: 1.5 10*3/uL (ref 0.7–4.0)
MCH: 32.3 pg (ref 26.0–34.0)
MCHC: 33.1 g/dL (ref 30.0–36.0)
MCV: 97.6 fL (ref 80.0–100.0)
Monocytes Absolute: 0.4 10*3/uL (ref 0.1–1.0)
Monocytes Relative: 11 %
Neutro Abs: 1.9 10*3/uL (ref 1.7–7.7)
Neutrophils Relative %: 47 %
Platelets: 132 10*3/uL — ABNORMAL LOW (ref 150–400)
RBC: 2.54 MIL/uL — ABNORMAL LOW (ref 3.87–5.11)
RDW: 14.2 % (ref 11.5–15.5)
WBC: 3.9 10*3/uL — ABNORMAL LOW (ref 4.0–10.5)
nRBC: 0 % (ref 0.0–0.2)

## 2023-07-09 LAB — GLUCOSE, CAPILLARY
Glucose-Capillary: 111 mg/dL — ABNORMAL HIGH (ref 70–99)
Glucose-Capillary: 151 mg/dL — ABNORMAL HIGH (ref 70–99)
Glucose-Capillary: 107 mg/dL — ABNORMAL HIGH (ref 70–99)
Glucose-Capillary: 146 mg/dL — ABNORMAL HIGH (ref 70–99)

## 2023-07-09 LAB — VZV PCR, CSF: VZV PCR, CSF: NEGATIVE

## 2023-07-09 MED ORDER — ACETAMINOPHEN 325 MG PO TABS
650.0000 mg | ORAL_TABLET | Freq: Four times a day (QID) | ORAL | Status: DC | PRN
  Administered 2023-07-09 – 2023-07-11 (×4): 650 mg via ORAL
  Filled 2023-07-09 (×4): qty 2

## 2023-07-09 NOTE — Consult Note (Signed)
Pharmacy Note - Acyclovir   Theresa Ferguson is a 77 y.o. female admitted on 07/06/2023 with  confusion and agitation  with fever. Neurology consulted for febrile encephalopathy with no clear indication. LP to rule out CNS infection. CSF had elevated WBC, leukocytes > neutrophils, hazy, glucose 77, protein 49. Patient has reported PCN allergy and no known history of using cephalosporins. No active lesions found from recent shingles infection. Neurology is consulted. Pharmacy has been consulted for acyclovir dosing for meningitis.  Today, 07/09/2023 Day 4 antivirals Scr 0.97 >> 1.09 >> 0.87 11/5 CT Head: no acute abnormalities  LP concerning for infection (viral vs bacterial) Diagnosed with shingles 06/19/23 was given Valtrex 1,000mg  to take BID for 7 days  Meningitis panel negative  Plan: Continue acyclovir 660 mg Q8H to Q12H until VZV PCR results Consider switch to PO valtrex or stopping depending on results as well as ID and Neurology recs Consider switching back to [facility] dosing as renal function improves and treatment is still indicated Follow that patient remain on fluids while receiving acyclovir  Currently no continuous 0.9% NS; monitor renal function Follow culture results and monitor decreasing renal function  Height: 5\' 5"  (165.1 cm) Weight: 66.3 kg (146 lb 2.6 oz) IBW/kg (Calculated) : 57  Temp (24hrs), Avg:98.1 F (36.7 C), Min:97.7 F (36.5 C), Max:98.8 F (37.1 C)  Recent Labs  Lab 07/06/23 0346 07/06/23 0421 07/06/23 0536 07/07/23 0547 07/08/23 0405 07/09/23 0310  WBC 5.4  --   --  6.4 4.5 3.9*  CREATININE  --   --  0.87 1.09* 1.07* 0.97  LATICACIDVEN  --  1.6  --   --   --   --     Estimated Creatinine Clearance: 44.4 mL/min (by C-G formula based on SCr of 0.97 mg/dL).    Allergies  Allergen Reactions   Codeine    Latex    Morphine And Codeine    Nickel    Penicillins    Atorvastatin Other (See Comments)   Cerivastatin Other (See Comments)   Fluvastatin  Other (See Comments)    Antimicrobials this admission: Acyclovir 11/5 >>  Vancomycin 11/5>> 11/6 Meropenem 11/5>> 11/6  Potential Neurotoxic agents Losartan 25 mg daily   Dose adjustments this admission: Acyclovir 660 mg Q8H to Q12H Vancomycin 500 mg Q12H to 1000 mg Q24H   Microbiology results: 11/5 meningitis panel: negative 11/5 CSF culture/gram stain: no organisms seen 11/5 Bcx: NGTD 11/5 Fungal: negative  Thank you for allowing pharmacy to be a part of this patient's care.  Effie Shy, PharmD Pharmacy Resident  07/09/2023 10:17 AM

## 2023-07-09 NOTE — Plan of Care (Signed)
Pt alert and oriented, having pain to left arm, numbness and pain. Pt cooperative and following commands. CSF PCR negative. Husband at bedside. Bed low, locked and call light in reach.  Problem: Education: Goal: Ability to describe self-care measures that may prevent or decrease complications (Diabetes Survival Skills Education) will improve Outcome: Progressing Goal: Individualized Educational Video(s) Outcome: Progressing   Problem: Coping: Goal: Ability to adjust to condition or change in health will improve Outcome: Progressing   Problem: Fluid Volume: Goal: Ability to maintain a balanced intake and output will improve Outcome: Progressing   Problem: Health Behavior/Discharge Planning: Goal: Ability to identify and utilize available resources and services will improve Outcome: Progressing Goal: Ability to manage health-related needs will improve Outcome: Progressing   Problem: Metabolic: Goal: Ability to maintain appropriate glucose levels will improve Outcome: Progressing   Problem: Nutritional: Goal: Maintenance of adequate nutrition will improve Outcome: Progressing Goal: Progress toward achieving an optimal weight will improve Outcome: Progressing   Problem: Skin Integrity: Goal: Risk for impaired skin integrity will decrease Outcome: Progressing   Problem: Tissue Perfusion: Goal: Adequacy of tissue perfusion will improve Outcome: Progressing   Problem: Education: Goal: Knowledge of General Education information will improve Description: Including pain rating scale, medication(s)/side effects and non-pharmacologic comfort measures Outcome: Progressing   Problem: Health Behavior/Discharge Planning: Goal: Ability to manage health-related needs will improve Outcome: Progressing   Problem: Clinical Measurements: Goal: Ability to maintain clinical measurements within normal limits will improve Outcome: Progressing Goal: Will remain free from infection Outcome:  Progressing Goal: Diagnostic test results will improve Outcome: Progressing Goal: Respiratory complications will improve Outcome: Progressing Goal: Cardiovascular complication will be avoided Outcome: Progressing   Problem: Activity: Goal: Risk for activity intolerance will decrease Outcome: Progressing   Problem: Nutrition: Goal: Adequate nutrition will be maintained Outcome: Progressing   Problem: Coping: Goal: Level of anxiety will decrease Outcome: Progressing   Problem: Elimination: Goal: Will not experience complications related to bowel motility Outcome: Progressing Goal: Will not experience complications related to urinary retention Outcome: Progressing   Problem: Pain Management: Goal: General experience of comfort will improve Outcome: Progressing   Problem: Safety: Goal: Ability to remain free from injury will improve Outcome: Progressing   Problem: Skin Integrity: Goal: Risk for impaired skin integrity will decrease Outcome: Progressing

## 2023-07-09 NOTE — Progress Notes (Signed)
Progress Note   Patient: Theresa Ferguson IEP:[id number] DOB: 05-19-1946 DOA: 07/06/2023     1 DOS: the patient was seen and examined on 07/09/2023     Brief hospital course: Theresa Ferguson is a 77 y.o. female with medical history significant of intermittent vertigo, HTN, HLD, right breast cancer status post lumpectomy and radioactive seed treatment, PVD on Plavix, IIDM, chronic peripheral neuropathy on gabapentin, brought in by family member for evaluation of worsening of mentations.   ED Course: Afebrile, tachycardia blood pressure stable nonhypoxic.  CT head negative for acute findings.  Blood work showed K3.0, bicarb 16, creatinine 0.8, VBG 7.39/33/33.   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 EEG read by neurology did not show any seizures Continue telemetry monitoring Diet advanced from n.p.o. 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 Katrinka Blazing At this point no surgical intervention is being planned but neurosurgeon has seen patient and has recommended outpatient follow-up which patient has agreed Continue to monitor  Left arm and hand shingle -No acute findings, crusted lesion on left hand otherwise no signs of active lesions.   Severe hypokalemia-improved Continue repletion and monitoring   Vertigo Continue meclizine    Compensated non-anion gap metabolic acidosis and respiratory alkalosis -Etiology unknown, and the patient had no diarrhea as per family. Monitor BMP closely   IIDM Holding metformin while inpatient Continue insulin therapy   PVD Continue Plavix   HTN Continue losartan     DVT prophylaxis: Lovenox Code Status: Full code Family Communication: Husband at bedside   Disposition Plan: Expect less than 2 midnight hospital stay   Consults called: Neurology   Admission status: Inpatient   Subjective:  Patient complained of numbness and weakness of the left upper extremity Denies nausea vomiting abdominal pain chest pain or cough  Physical Exam: Eyes: PERRL, lids and conjunctivae normal ENMT: Mucous membranes are moist. Posterior pharynx clear of any exudate or lesions.Normal dentition.  Neck: normal, supple, no masses, no thyromegaly Respiratory: clear to auscultation bilaterally, no wheezing, no crackles. Normal respiratory effort. No accessory muscle use.  Cardiovascular: Regular rate and rhythm, no murmurs / rubs / gallops. No extremity edema. 2+ pedal pulses. No carotid bruits.  Abdomen: no tenderness, no masses palpated. No hepatosplenomegaly. Bowel sounds positive.  Musculoskeletal: Numbness noted to the left upper extremity as well as 4/5 weakness Skin: Crusted small lesion on the left palm Neurologic: Confusion is much improved Data Reviewed:     Disposition: Status is: Patient     Time spent: 40 minutes  Data Reviewed:     Latest Ref Rng & Units 07/09/2023    3:10 AM 07/08/2023    4:05 AM 07/07/2023    5:47 AM  CBC  WBC 4.0 - 10.5 K/uL 3.9  4.5  6.4   Hemoglobin 12.0 - 15.0 g/dL 8.2  8.3  9.1   Hematocrit 36.0 - 46.0 % 24.8  23.8  27.2  Platelets 150 - 400 K/uL 132  128  123     Vitals:   07/09/23 0041 07/09/23 0451 07/09/23 0719 07/09/23 1137  BP: 130/63 132/75 (!) 146/79 (!) 162/71  Pulse: 73 73 78 88  Resp: 16 16 18 18   Temp: 98 F (36.7 C) 97.9  F (36.6 C) 98.2 F (36.8 C) 98.2 F (36.8 C)  TempSrc:      SpO2: 97% 98% 98% 98%  Weight:      Height:          Latest Ref Rng & Units 07/09/2023    3:10 AM 07/08/2023    4:05 AM 07/07/2023    7:10 PM  BMP  Glucose 70 - 99 mg/dL 409  811    BUN 8 - 23 mg/dL 19  21    Creatinine 9.14 - 1.00 mg/dL 7.82  9.56    Sodium 213 - 145 mmol/L 139  144    Potassium 3.5 - 5.1 mmol/L 3.6  3.9  3.5   Chloride 98 - 111 mmol/L 109  122    CO2 22 - 32 mmol/L 20  17    Calcium 8.9 - 10.3 mg/dL 8.5  8.6         Author: Loyce Dys, MD 07/09/2023 2:26 PM  For on call review [email or web address].

## 2023-07-09 NOTE — Progress Notes (Signed)
Physical Therapy Treatment Patient Details Name: Theresa Ferguson MRN: [id number] DOB: 04-02-1946 Today's Date: 07/09/2023   History of Present Illness Theresa Ferguson is a 77 y.o. female with medical history significant of intermittent vertigo, HTN, HLD, right breast cancer status post lumpectomy and radioactive seed treatment, PVD on Plavix, IIDM, chronic peripheral neuropathy on gabapentin, brought in by family member for evaluation of worsening of mentations.    PT Comments  Pt alert and oriented this session, denied pain, and demonstrated increased cognition and mobility this session. PT/OT co-treat for patient and therapist safety. Pt impulsive with bed mobility, but demonstrated mod I; pt required CGA to stand, but minA to steady 2/2 initial posterior weightshift with donning pants. Tolerated step pivot transfer to the chair with CGA and mild unsteadiness. Progressed to ambulation with RW and close chair follow for safety; minA required for verbal cuing to correct narrow BOS and keep pt on track 2/2 impulsivity. Pt showing improved balance impairments vs previous sessions, but would continue to benefit from skilled acute PT to maximize functional independence and address continued limitations.    If plan is discharge home, recommend the following: A little help with walking and/or transfers;A little help with bathing/dressing/bathroom   Can travel by private vehicle     Yes  Equipment Recommendations  None recommended by PT    Recommendations for Other Services       Precautions / Restrictions Precautions Precautions: Fall Restrictions Weight Bearing Restrictions: No     Mobility  Bed Mobility Overal bed mobility: Modified Independent             General bed mobility comments: pt impulsive with bed mobility    Transfers Overall transfer level: Needs assistance Equipment used: Rolling walker (2 wheels) Transfers: Sit to/from Stand, Bed to chair/wheelchair/BSC Sit to  Stand: Contact guard assist, Min assist   Step pivot transfers: Contact guard assist       General transfer comment: CGA for STS, minA to maintain balance 2/2 posterior lean, CGA for step pivot to recliner; close chair follow for safety    Ambulation/Gait Ambulation/Gait assistance: Min assist Gait Distance (Feet): 160 Feet Assistive device: Rolling walker (2 wheels) Gait Pattern/deviations: Narrow base of support       General Gait Details: B/L toe-out, very narrow BOS with constant cueing to maintain wider BOS and stability with RW (pt tendency to veer to the R 2/2 L-sided weakness)   Stairs             Wheelchair Mobility     Tilt Bed    Modified Rankin (Stroke Patients Only)       Balance Overall balance assessment: Needs assistance Sitting-balance support: Feet supported, No upper extremity supported Sitting balance-Leahy Scale: Good     Standing balance support: No upper extremity supported, Bilateral upper extremity supported, During functional activity Standing balance-Leahy Scale: Fair Standing balance comment: minA to steady when donning pants 2/2 posterior LOB, progressed to CGA when donning mask                            Cognition Arousal: Alert Behavior During Therapy: WFL for tasks assessed/performed (impulsive with bed mobility) Overall Cognitive Status: Within Functional Limits for tasks assessed                                 General Comments: oriented x4 today, pleasant, talkative  and agreeable to PT/OT session        Exercises      General Comments        Pertinent Vitals/Pain Pain Assessment Pain Assessment: No/denies pain    Home Living                          Prior Function            PT Goals (current goals can now be found in the care plan section) Acute Rehab PT Goals Patient Stated Goal: to play with grandkids PT Goal Formulation: With patient Time For Goal Achievement:  07/21/23 Potential to Achieve Goals: Fair Progress towards PT goals: Progressing toward goals    Frequency    Min 1X/week      PT Plan      Co-evaluation PT/OT/SLP Co-Evaluation/Treatment: Yes Reason for Co-Treatment: Necessary to address cognition/behavior during functional activity;For patient/therapist safety PT goals addressed during session: Mobility/safety with mobility;Balance;Proper use of DME        AM-PAC PT "6 Clicks" Mobility   Outcome Measure  Help needed turning from your back to your side while in a flat bed without using bedrails?: None   Help needed moving to and from a bed to a chair (including a wheelchair)?: A Little Help needed standing up from a chair using your arms (e.g., wheelchair or bedside chair)?: A Little Help needed to walk in hospital room?: A Little Help needed climbing 3-5 steps with a railing? : A Little 6 Click Score: 16    End of Session Equipment Utilized During Treatment: Gait belt Activity Tolerance: Patient tolerated treatment well Patient left: in chair;with chair alarm set;with family/visitor present;with call bell/phone within reach Nurse Communication: Mobility status PT Visit Diagnosis: Unsteadiness on feet (R26.81);Other abnormalities of gait and mobility (R26.89);Difficulty in walking, not elsewhere classified (R26.2)     Time: 1610-9604 PT Time Calculation (min) (ACUTE ONLY): 23 min  Charges:    $Gait Training: 8-22 mins PT General Charges $$ ACUTE PT VISIT: 1 Visit                       Shauna Hugh, SPT 07/09/2023, 4:29 PM

## 2023-07-09 NOTE — Plan of Care (Signed)

## 2023-07-09 NOTE — Progress Notes (Signed)
Date of Admission:  07/06/2023     ID: [id number] Theresa Ferguson is a 77 y.o. female  Principal Problem:   AMS (altered mental status) Active Problems:   Encephalitis   CSF pleocytosis   Theresa Ferguson is a 77 y.o. with a history of ca breast rt s/p lumpectomy, adjuvant radiation therapy and arimedex until 2022, anemia,has been unwell for the past 3 weeks.  Patient had gone to urgent care on 06/13/2023 complaining of dizziness of 2 days duration bordering on vertigo.  She had taken meclizine with some benefit.  They diagnosed her with labyrinthitis versus acute suppurative otitis media of the right ear and gave a prednisone, azithromycin and meclizine.  She returned to the [facility] urgent care on 06/17/2023 complaining of left-sided neck pain and spasms of the neck going down is her shoulder arm and hand.  An x-ray was done that was consistent with cervical radiculopathy.  She was prescribed Skelaxin and prednisone .  On 06/19/2023 she went to the ED complaining of a rash on the left arm which was painful and vesicular we diagnosed her with shingles and gave Valtrex 1 g twice a day for 7 days, gabapentin.  On 06/24/2023 she returned to the ED complaining of vertigo and inner ear issues.  She also complained of gait instability and dysmetria and a concern for central vertigo was raised and she got MRI of the brain which was negative for stroke.  She was discharged from the ED.  On 07/06/2023 patient return to the ED at around 3:30 AM having weakness altered mental status Nausea and poor appetite and not able to eat for the past couple of weeks. As per husband she was confused and sleeping the whole time   Subjective: Pt is awake and alert Walked with PT in the corridor with a walker C/o pain left arm   Medications:   enoxaparin (LOVENOX) injection  40 mg Subcutaneous Q24H   escitalopram  20 mg Oral Daily   gabapentin  800 mg Oral BID   And   gabapentin  1,600 mg Oral QHS   insulin aspart  0-15  Units Subcutaneous TID WC   lidocaine 1 %  5 mL Subcutaneous Once   losartan  25 mg Oral Daily   pantoprazole  40 mg Oral Daily   rosuvastatin  20 mg Oral Daily    Objective: Vital signs in last 24 hours: Patient Vitals for the past 24 hrs:  BP Temp Pulse Resp SpO2  07/09/23 1137 (!) 162/71 98.2 F (36.8 C) 88 18 98 %  07/09/23 0719 (!) 146/79 98.2 F (36.8 C) 78 18 98 %  07/09/23 0451 132/75 97.9 F (36.6 C) 73 16 98 %  07/09/23 0041 130/63 98 F (36.7 C) 73 16 97 %  07/08/23 2013 112/72 97.7 F (36.5 C) 84 16 99 %  07/08/23 1533 (!) 148/84 98.8 F (37.1 C) 76 18 99 %     PHYSICAL EXAM:  General: Alert, cooperative, no distress, appears stated age. Walking with PT Lungs: Clear to auscultation bilaterally. No Wheezing or Rhonchi. No rales. Heart: Regular rate and rhythm, no murmur, rub or gallop. Abdomen: Soft, non-tender,not distended. Bowel sounds normal. No masses Extremities: atraumatic, no cyanosis. No edema. No clubbing Skin: left palm- scab seen   Lymph: Cervical, supraclavicular normal. Neurologic: left upper extremity- nose finger test- some past pointing Lower extremities heel- shin okay Walks with walker- slow hesitant walk  Lab Results    Latest Ref Rng &  Units 07/09/2023    3:10 AM 07/08/2023    4:05 AM 07/07/2023    5:47 AM  CBC  WBC 4.0 - 10.5 K/uL 3.9  4.5  6.4   Hemoglobin 12.0 - 15.0 g/dL 8.2  8.3  9.1   Hematocrit 36.0 - 46.0 % 24.8  23.8  27.2   Platelets 150 - 400 K/uL 132  128  123        Latest Ref Rng & Units 07/09/2023    3:10 AM 07/08/2023    4:05 AM 07/07/2023    7:10 PM  CMP  Glucose 70 - 99 mg/dL 027  253    BUN 8 - 23 mg/dL 19  21    Creatinine 6.64 - 1.00 mg/dL 4.03  4.74    Sodium 259 - 145 mmol/L 139  144    Potassium 3.5 - 5.1 mmol/L 3.6  3.9  3.5   Chloride 98 - 111 mmol/L 109  122    CO2 22 - 32 mmol/L 20  17    Calcium 8.9 - 10.3 mg/dL 8.5  8.6        Microbiology: BC- NG CSF Culture NG so far M/E panel Neg VZV PCR  CSF repeated negative Palm scab wound - swabbed for vzv  Studies/Results: MR CERVICAL SPINE W WO CONTRAST  Result Date: 07/08/2023 CLINICAL DATA:  Provided history: Myelopathy, acute, cervical spine. Concern for shingles related radiculopathy. Check for nerve root enhancement. EXAM: MRI CERVICAL SPINE WITHOUT AND WITH CONTRAST TECHNIQUE: Multiplanar and multiecho pulse sequences of the cervical spine, to include the craniocervical junction and cervicothoracic junction, were obtained without and with intravenous contrast. CONTRAST:  6mL GADAVIST GADOBUTROL 1 MMOL/ML IV SOLN COMPARISON:  Report from cervical spine radiographs 06/17/2023 (images unavailable). Ultrasound of the head/neck soft tissues 07/09/2022. FINDINGS: Alignment: Slight C5-C6 grade 1 retrolisthesis. Slight grade 1 anterolisthesis at C7-T1. 2 mm grade 1 anterolisthesis at T1-T2 and T2-T3. Vertebrae: Cervical vertebral body height is maintained. Minimal degenerative endplate edema at D6-L8 and C6-C7. No focal worrisome marrow lesion. Cord: Flattening of the spinal cord at C4-C5, C5-C6 and C6-C7 as described below. Subtle T2 hyperintense signal abnormality is questioned within the left aspect of the spinal cord at the C4-C5 and C5-C6 levels (for instance as seen on series 5, image 9). Additionally, there is a suspected small focus of myelomalacia within the left aspect of the spinal cord at the C5-C6 level (series 8, image 18). Posterior Fossa, vertebral arteries, paraspinal tissues: No abnormality identified within included portions of the posterior fossa. Flow voids preserved within the imaged cervical vertebral arteries. No paraspinal mass or collection. Disc levels: Multilevel disc degeneration, greatest at C4-C5, C5-C6 and C6-C7 (moderate-to-advanced at these levels). C2-C3: No significant disc herniation or spinal canal stenosis. Facet hypertrophy and arthrosis (greater on the left). Mild left neural foraminal narrowing C3-C4: Disc bulge  with minimal bilateral uncovertebral hypertrophy. Facet hypertrophy and arthrosis. Ligamentum flavum thickening. Mild spinal canal stenosis. Mild relative bilateral neural foraminal narrowing. C4-C5: Posterior disc osteophyte complex with bilateral disc osteophyte ridge/uncinate hypertrophy. Facet hypertrophy. Ligamentum flavum thickening. Severe spinal canal stenosis with spinal cord flattening. Subtle T2 hyperintense signal abnormality is questioned within the left aspect of the spinal cord (for instance as seen on series 5, image 9). Severe bilateral neural foraminal narrowing. C5-C6: Slight grade 1 retrolisthesis. Disc bulge with bilateral disc osteophyte ridge/uncinate hypertrophy. Facet hypertrophy. Ligamentum flavum thickening. Severe spinal canal stenosis with spinal cord flattening. Subtle T2 hyperintense signal abnormality is questioned within the  left aspect of the spinal cord (for instance as seen on series 5, image 9). Additionally, there is a suspected small focus of myelomalacia within the left aspect of the spinal cord at this level (series 8, image 18). Severe bilateral neural foraminal narrowing. C6-C7: Small central disc protrusion. Uncovertebral hypertrophy (greater on the right). Mild facet arthrosis. Ligamentum flavum thickening. Mild-to-moderate spinal canal stenosis (with mild flattening of the ventral spinal cord). Bilateral neural foraminal narrowing (moderate/severe right, mild left). C7-T1: Slight grade 1 anterolisthesis. Moderate facet arthrosis. No significant spinal canal or foraminal stenosis. 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 Impressions #3, #4, #5 and #6 will be called to the ordering clinician or representative by the Radiologist Assistant, and communication documented in the PACS or [employer]. IMPRESSION: 1. No pathologic nerve root enhancement is identified. 2. Cervical spondylosis as outlined. 3. At C4-C5, there is moderate-to-advanced disc  degeneration with multifactorial severe spinal canal stenosis (and spinal cord flattening). Severe bilateral neural foraminal narrowing. 4. At C5-C6, there is moderate-to-advanced disc degeneration. Multifactorial severe spinal canal stenosis (with spinal cord flattening). Severe bilateral neural foraminal narrowing. 5. Subtle T2 hyperintense signal abnormality is questioned within the left aspect of the spinal cord at the C4-C5 and C5-C6 levels, suspicious for mild spondylotic cord edema. 6. Suspected superimposed small focus of myelomalacia within the left aspect of the spinal cord at C5-C6. 7. At C6-C7, there is moderate-to-advanced disc degeneration. A central disc protrusion contributes to multifactorial mild-to-moderate spinal canal stenosis (with mild flattening of the ventral spinal cord). Bilateral neural foraminal narrowing (moderate/severe right, mild left). 8. No more than mild spinal canal narrowing, and no significant foraminal stenosis, at the remaining cervical levels. Electronically Signed   By: Jackey Loge D.O.   On: 07/08/2023 18:17   EEG adult  Result Date: 07/07/2023 Rejeana Brock, MD     07/07/2023  3:35 PM History: 76 year old with encephalopathy Sedation: None Patient State: Awake and drowsy Technique: This EEG was acquired with electrodes placed according to the International 10-20 electrode system (including Fp1, Fp2, F3, F4, C3, C4, P3, P4, O1, O2, T3, T4, T5, T6, A1, A2, Fz, Cz, Pz). The following electrodes were missing or displaced: none. Background: The background is fairly disorganized and consists predominantly of generalized irregular delta and theta range activities.  There is a very poorly sustained posterior dominant rhythm of 9 Hz that is seen at times.  There is no epileptiform discharge seen. Photic stimulation: Physiologic driving is now performed EEG Abnormalities: 1) generalized irregular slow activity Clinical Interpretation: This EEG is consistent with a  generalized non-specific cerebral dysfunction(encephalopathy). There was no seizure or seizure predisposition recorded on this study. Please note that lack of epileptiform activity on EEG does not preclude the possibility of epilepsy. Ritta Slot, MD [facility] [phone] If 7pm- 7am, please page neurology on call as listed in AMION.    Assessment/Plan:   Encephalopathy - resolved CSF pleocytosis- lymphocytic predominance Fever No bacterial meningitis There was a concern for VZV encephalitis/meningiits She apparently had shingles left arm on 10/17 and was given Valtrex 1 gram BID for 7 days Currently there is no evidence of active lesions ME panel is neg-  VZV PCR is also neg in the ME panel? D.D NMDAR encephaiiits R/o carcinomatosis  meningis  Left c5/c6 dermatome involvement? 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 S/p lumpectomy, radiation and had taken Arimedex   HTN on losartan   HLD on atorvastatin  Discussed the management with patient and husband and care team   [facility] physician will cover from this weekend  for 2 weeks-Please check Amion for physician on call if needed

## 2023-07-09 NOTE — Progress Notes (Signed)
Subjective: Her encephalopathy continues to be much improved, but   Exam: Vitals:   07/09/23 0719 07/09/23 1137  BP: (!) 146/79 (!) 162/71  Pulse: 78 88  Resp: 18 18  Temp: 98.2 F (36.8 C) 98.2 F (36.8 C)  SpO2: 98% 98%   Gen: In bed, NAD Resp: non-labored breathing, no acute distress Abd: soft, nt  Neuro: MS: Awake, alert, speech is coherent.  She has some difficulty with serial sevens. CN: Pupils equal round and reactive, EOMI visual fields full Motor: She has interossei weakness as well as shoulder abduction weakness and elbow extension weakness on the left.  5/5 in the left leg, no facial weakness Sensory: She has numbness crossing multiple dermatomes in the left arm extending proximally, she states that this is the same as it was yesterday but yesterday she described more of a ulnar distribution numbness. DTR: 2+ and symmetric at the knees, absent at the ankles.  She has 2+ in the right biceps, 1+ in the left biceps  Pertinent Labs: Results for orders placed or performed during the hospital encounter of 07/06/23 (from the past 24 hour(s))  Glucose, capillary     Status: Abnormal   Collection Time: 07/08/23  3:34 PM  Result Value Ref Range   Glucose-Capillary 116 (H) 70 - 99 mg/dL  Glucose, capillary     Status: Abnormal   Collection Time: 07/08/23  8:15 PM  Result Value Ref Range   Glucose-Capillary 145 (H) 70 - 99 mg/dL  CBC with Differential/Platelet     Status: Abnormal   Collection Time: 07/09/23  3:10 AM  Result Value Ref Range   WBC 3.9 (L) 4.0 - 10.5 K/uL   RBC 2.54 (L) 3.87 - 5.11 MIL/uL   Hemoglobin 8.2 (L) 12.0 - 15.0 g/dL   HCT 09.8 (L) 11.9 - 14.7 %   MCV 97.6 80.0 - 100.0 fL   MCH 32.3 26.0 - 34.0 pg   MCHC 33.1 30.0 - 36.0 g/dL   RDW 82.9 56.2 - 13.0 %   Platelets 132 (L) 150 - 400 K/uL   nRBC 0.0 0.0 - 0.2 %   Neutrophils Relative % 47 %   Neutro Abs 1.9 1.7 - 7.7 K/uL   Lymphocytes Relative 37 %   Lymphs Abs 1.5 0.7 - 4.0 K/uL   Monocytes  Relative 11 %   Monocytes Absolute 0.4 0.1 - 1.0 K/uL   Eosinophils Relative 3 %   Eosinophils Absolute 0.1 0.0 - 0.5 K/uL   Basophils Relative 1 %   Basophils Absolute 0.0 0.0 - 0.1 K/uL   Immature Granulocytes 1 %   Abs Immature Granulocytes 0.02 0.00 - 0.07 K/uL  Basic metabolic panel     Status: Abnormal   Collection Time: 07/09/23  3:10 AM  Result Value Ref Range   Sodium 139 135 - 145 mmol/L   Potassium 3.6 3.5 - 5.1 mmol/L   Chloride 109 98 - 111 mmol/L   CO2 20 (L) 22 - 32 mmol/L   Glucose, Bld 109 (H) 70 - 99 mg/dL   BUN 19 8 - 23 mg/dL   Creatinine, Ser 8.65 0.44 - 1.00 mg/dL   Calcium 8.5 (L) 8.9 - 10.3 mg/dL   GFR, Estimated >78 >46 mL/min   Anion gap 7 5 - 15  Glucose, capillary     Status: Abnormal   Collection Time: 07/09/23  7:20 AM  Result Value Ref Range   Glucose-Capillary 111 (H) 70 - 99 mg/dL  Glucose, capillary  Status: Abnormal   Collection Time: 07/09/23 11:36 AM  Result Value Ref Range   Glucose-Capillary 151 (H) 70 - 99 mg/dL   Ammonia on admission 10 UA - negative UDS - negative TSH - 1.18 Procalcitonin < 0.1 VZV skin scrping - negative.   CSF WBC 42 CSF RBC 62 CSF protein 49 CSF glucose 77 Meningoencephalitis panel-negative Autoimmune encephalitis panel on CSF -pending   Impression: 77 year old female who presented initially with progressive encephalopathy following a 3-week episode concerning for shingles.    Her presentation is quite unusual, she had several episodes of transient vertigo followed by an episode concerning for shingles which was treated with Valtrex starting 10/19.  She has had worsening gait over this timeframe, needing to use a walker at home.  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  LP did confirm a pleocytosis, however the patient markedly improved over the course of 48 hours.  She now has progressing  numbness and weakness of the left arm which is new since admission.  MRI of her cervical spine does not show any enhancement, but does show some T2 change in association with degenerative compression.  The distribution of her numbness and weakness does not fit a clear dermatomal pattern, so I think VZV related radiculopathy is less likely, but VZV related myelitis is still possible, though less likely given lack of enhancement.  I do wonder if the pain that initially got her diagnosed with shingles could have been radicular in nature associated with progressive cervical spine disease, but this does not necessarily explain her pleocytosis.  Overall this is a very unusual picture, and I think it could be helpful to have a neurosurgical opinion regarding her cervical spondylosis and possible contribution to her left upper extremity numbness.  In the interim I think it would be reasonable to continue acyclovir for possible VZV myelitis pending repeat PCR testing of both the hand and CSF.   Recommendations: 1) f/u autoimmune encephalitis panel 2) VZV PCR in CSF 3) MRI c-spine w/wo contrast 4) neurology will continue to follow.  Ritta Slot, MD [facility] [phone]  If 7pm- 7am, please page neurology on call as listed in AMION.

## 2023-07-09 NOTE — Progress Notes (Signed)
Occupational Therapy Treatment Patient Details Name: Theresa Ferguson MRN: [id number] DOB: 1946/06/16 Today's Date: 07/09/2023   History of present illness Theresa Ferguson is a 77 y.o. female with medical history significant of intermittent vertigo, HTN, HLD, right breast cancer status post lumpectomy and radioactive seed treatment, PVD on Plavix, IIDM, chronic peripheral neuropathy on gabapentin, brought in by family member for evaluation of worsening of mentations.   OT comments  Pt is supine in bed on arrival. Pleasant and agreeable to PT/OT co treat session to assess mobility safely as pt has been very unsteady and un-oriented last several days. She denies pain. Pt performed bed mobility MOD I and impulsively. Pt required CGA for STS from EOB and Min A for steadying in standing to pull pants over hips d/t posterior bias. SPT to recliner with CGA/Min A for safety using RW as she tends to pick it up off the floor during turns. Mobility in hallway with Min A for safety/steadying d/t occasional swaying/narrow BOS and close W/C follow. She reports ongoing issues with sensation, coordination and weakness to LUE/hand and did require cueing to use L hand for dressing task. Pt returned to bed with all needs in place and will cont to require skilled acute OT services to maximize her safety and IND to return to PLOF.       If plan is discharge home, recommend the following:  A lot of help with walking and/or transfers;Assistance with cooking/housework;Assist for transportation;Supervision due to cognitive status;A little help with bathing/dressing/bathroom   Equipment Recommendations  Other (comment) (defer)    Recommendations for Other Services      Precautions / Restrictions Precautions Precautions: Fall Precaution Comments: droplet Restrictions Weight Bearing Restrictions: No       Mobility Bed Mobility Overal bed mobility: Modified Independent Bed Mobility: Supine to Sit     Supine to  sit: HOB elevated, Modified independent (Device/Increase time), Used rails     General bed mobility comments: pt impulsive with bed mobility    Transfers Overall transfer level: Needs assistance Equipment used: Rolling walker (2 wheels) Transfers: Sit to/from Stand, Bed to chair/wheelchair/BSC Sit to Stand: Contact guard assist, Min assist     Step pivot transfers: Contact guard assist     General transfer comment: CGA for STS, minA to maintain balance 2/2 posterior lean during standing tasks, CGA for step pivot to recliner;  Min A with cueing for safety during mobility using RW and close chair follow     Balance Overall balance assessment: Needs assistance Sitting-balance support: Feet supported, No upper extremity supported Sitting balance-Leahy Scale: Good Sitting balance - Comments: able to don pants with no LOB seated EOB   Standing balance support: No upper extremity supported, Bilateral upper extremity supported, During functional activity Standing balance-Leahy Scale: Fair Standing balance comment: minA to steady when donning pants 2/2 posterior LOB, progressed to CGA when donning mask in standing                           ADL either performed or assessed with clinical judgement   ADL Overall ADL's : Needs assistance/impaired                     Lower Body Dressing: Minimal assistance Lower Body Dressing Details (indicate cue type and reason): no physical assistance needed to don pants seated then via STS, however needs Min A for steadying while pulling over hips Toilet Transfer: Minimal assistance;Contact guard  assist Toilet Transfer Details (indicate cue type and reason): simulated from bed to recliner with Min/CGA d/t unsteadiness at time and lifting RW off the ground         Functional mobility during ADLs: Minimal assistance;+2 for safety/equipment;Rolling walker (2 wheels)      Extremity/Trunk Assessment Upper Extremity Assessment LUE  Deficits / Details: decreased LUE strength, sensation and FMC LUE Sensation: decreased light touch LUE Coordination: decreased fine motor       Cervical / Trunk Assessment Cervical / Trunk Assessment: Normal    Vision       Perception     Praxis      Cognition Arousal: Alert Behavior During Therapy: WFL for tasks assessed/performed (impulsive with bed mobility and easily distracted at times) Overall Cognitive Status: Within Functional Limits for tasks assessed                                 General Comments: oriented x4 today, pleasant, talkative and agreeable to PT/OT session        Exercises      Shoulder Instructions       General Comments better cogntion and improving balance today compared to yesterday, reports continued deficits in LUE with sensation, coordination and weakness    Pertinent Vitals/ Pain       Pain Assessment Pain Assessment: No/denies pain  Home Living                                          Prior Functioning/Environment              Frequency  Min 1X/week        Progress Toward Goals  OT Goals(current goals can now be found in the care plan section)  Progress towards OT goals: Progressing toward goals  Acute Rehab OT Goals Patient Stated Goal: return home OT Goal Formulation: With patient Time For Goal Achievement: 07/22/23 Potential to Achieve Goals: Good  Plan      Co-evaluation    PT/OT/SLP Co-Evaluation/Treatment: Yes Reason for Co-Treatment: Necessary to address cognition/behavior during functional activity;For patient/therapist safety PT goals addressed during session: Mobility/safety with mobility;Balance;Proper use of DME OT goals addressed during session: ADL's and self-care      AM-PAC OT "6 Clicks" Daily Activity     Outcome Measure   Help from another person eating meals?: None Help from another person taking care of personal grooming?: None Help from another person  toileting, which includes using toliet, bedpan, or urinal?: A Little Help from another person bathing (including washing, rinsing, drying)?: A Little Help from another person to put on and taking off regular upper body clothing?: None Help from another person to put on and taking off regular lower body clothing?: A Little 6 Click Score: 21    End of Session Equipment Utilized During Treatment: Rolling walker (2 wheels);Gait belt  OT Visit Diagnosis: Unsteadiness on feet (R26.81);Other abnormalities of gait and mobility (R26.89);Muscle weakness (generalized) (M62.81)   Activity Tolerance Patient tolerated treatment well   Patient Left in chair;with chair alarm set;with nursing/sitter in room;with family/visitor present   Nurse Communication Mobility status        Time: 2536-6440 OT Time Calculation (min): 23 min  Charges: OT General Charges $OT Visit: 1 Visit OT Treatments $Therapeutic Activity: 8-22 mins  [employer], OTR/L  07/09/23, 4:56 PM   Shanyiah Conde E Avid Guillette 07/09/2023, 4:51 PM

## 2023-07-09 NOTE — TOC Progression Note (Signed)
Transition of Care [facility]) - Progression Note    Patient Details  Name: Theresa Ferguson MRN: [id number] Date of Birth: [birth year]  Transition of Care [facility]) CM/SW Contact  Allena Katz, LCSW Phone Number: 07/09/2023, 2:50 PM  Clinical Narrative:   CSW spoke with husband who reports pt is walking much better and he does not think she needs rehab. Husband wants to see PT work with her today and would like to take her home. He is unsure about HH and asks that someone call tomorrow about this.     Expected Discharge Plan: Skilled Nursing Facility Barriers to Discharge: Continued Medical Work up  Expected Discharge Plan and Services       Living arrangements for the past 2 months: Single Family Home                                       Social Determinants of Health (SDOH) Interventions SDOH Screenings   Food Insecurity: Patient Unable To Answer (07/06/2023)  Housing: Patient Unable To Answer (07/06/2023)  Transportation Needs: Patient Unable To Answer (07/06/2023)  Utilities: Patient Unable To Answer (07/06/2023)  Tobacco Use: Medium Risk (07/06/2023)    Readmission Risk Interventions     No data to display

## 2023-07-10 DIAGNOSIS — G959 Disease of spinal cord, unspecified: Secondary | ICD-10-CM | POA: Diagnosis not present

## 2023-07-10 DIAGNOSIS — R4182 Altered mental status, unspecified: Secondary | ICD-10-CM | POA: Diagnosis not present

## 2023-07-10 DIAGNOSIS — R29898 Other symptoms and signs involving the musculoskeletal system: Secondary | ICD-10-CM | POA: Diagnosis not present

## 2023-07-10 DIAGNOSIS — M4802 Spinal stenosis, cervical region: Secondary | ICD-10-CM

## 2023-07-10 DIAGNOSIS — E876 Hypokalemia: Secondary | ICD-10-CM | POA: Diagnosis not present

## 2023-07-10 DIAGNOSIS — E872 Acidosis, unspecified: Secondary | ICD-10-CM | POA: Diagnosis not present

## 2023-07-10 LAB — VARICELLA-ZOSTER BY PCR: Varicella-Zoster, PCR: POSITIVE — AB

## 2023-07-10 LAB — BASIC METABOLIC PANEL
Anion gap: 8 (ref 5–15)
BUN: 17 mg/dL (ref 8–23)
CO2: 21 mmol/L — ABNORMAL LOW (ref 22–32)
Calcium: 8.8 mg/dL — ABNORMAL LOW (ref 8.9–10.3)
Chloride: 113 mmol/L — ABNORMAL HIGH (ref 98–111)
Creatinine, Ser: 0.98 mg/dL (ref 0.44–1.00)
GFR, Estimated: 60 mL/min — ABNORMAL LOW (ref >60)
Glucose, Bld: 110 mg/dL — ABNORMAL HIGH (ref 70–99)
Potassium: 3.6 mmol/L (ref 3.5–5.1)
Sodium: 142 mmol/L (ref 135–145)

## 2023-07-10 LAB — CBC WITH DIFFERENTIAL/PLATELET
Abs Immature Granulocytes: 0.01 10*3/uL (ref 0.00–0.07)
Basophils Absolute: 0 10*3/uL (ref 0.0–0.1)
Basophils Relative: 0 %
Eosinophils Absolute: 0.1 10*3/uL (ref 0.0–0.5)
Eosinophils Relative: 3 %
HCT: 22.8 % — ABNORMAL LOW (ref 36.0–46.0)
Hemoglobin: 7.8 g/dL — ABNORMAL LOW (ref 12.0–15.0)
Immature Granulocytes: 0 %
Lymphocytes Relative: 50 %
Lymphs Abs: 1.4 10*3/uL (ref 0.7–4.0)
MCH: 32.6 pg (ref 26.0–34.0)
MCHC: 34.2 g/dL (ref 30.0–36.0)
MCV: 95.4 fL (ref 80.0–100.0)
Monocytes Absolute: 0.3 10*3/uL (ref 0.1–1.0)
Monocytes Relative: 11 %
Neutro Abs: 1 10*3/uL — ABNORMAL LOW (ref 1.7–7.7)
Neutrophils Relative %: 36 %
Platelets: 131 10*3/uL — ABNORMAL LOW (ref 150–400)
RBC: 2.39 MIL/uL — ABNORMAL LOW (ref 3.87–5.11)
RDW: 14.2 % (ref 11.5–15.5)
WBC: 2.9 10*3/uL — ABNORMAL LOW (ref 4.0–10.5)
nRBC: 0 % (ref 0.0–0.2)

## 2023-07-10 LAB — GLUCOSE, CAPILLARY
Glucose-Capillary: 119 mg/dL — ABNORMAL HIGH (ref 70–99)
Glucose-Capillary: 170 mg/dL — ABNORMAL HIGH (ref 70–99)
Glucose-Capillary: 80 mg/dL (ref 70–99)
Glucose-Capillary: 165 mg/dL — ABNORMAL HIGH (ref 70–99)

## 2023-07-10 LAB — CSF CULTURE W GRAM STAIN
Culture: NO GROWTH
Gram Stain: NONE SEEN

## 2023-07-10 MED ORDER — SODIUM CHLORIDE 0.9 % IV SOLN: INTRAVENOUS | Status: AC

## 2023-07-10 NOTE — Consult Note (Signed)
Consulting Department:  Inpatient medicine  Primary Physician:  Ollen Bowl, MD  Chief Complaint: Left upper extremity pain and weakness  History of Present Illness: 07/09/2023 Theresa Ferguson is a 77 y.o. female who presents with the chief complaint of left upper extremity pain and weakness.  She states that this has been going on for well over a month.  She also noticed some slight difficulty with walking.  She felt like she was dealing with a shingles outbreak as she did have some crusting lesions on her left upper extremity, however continues to have pain.  She also noticed significant weakness especially down into her hand and wrist.  Neurology evaluated and was concern for possible cervical radiculopathy so obtained cervical imaging which did demonstrate significant stenosis.  Review of Systems:  A 10 point review of systems is negative, except for the pertinent positives and negatives detailed in the HPI.  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   Personal history of radiation therapy    PONV (postoperative nausea and vomiting)    Radiculopathy of lumbosacral region 02/02/2013   RLS (restless legs syndrome) 02/05/2015   Seasonal allergies     Past Surgical History: Past Surgical History:  Procedure Laterality Date   BREAST LUMPECTOMY Right 08/07/2020   right lumpectomy w/ radiation   BREAST LUMPECTOMY WITH RADIOACTIVE SEED LOCALIZATION Right 08/07/2020   Procedure: RIGHT BREAST LUMPECTOMY WITH RADIOACTIVE SEED LOCALIZATION;  Surgeon: Emelia Loron, MD;  Location: [facility];  Service: General;  Laterality: Right;   FINGER SURGERY Right 08/31/1996   [facility]    kidney stones  08/31/1978   KNEE SURGERY Bilateral    partial right-2008,left full replacement-2006-Dr [facility]   LAMINECTOMY     l4-5   LOWER EXTREMITY ANGIOGRAPHY Left 12/29/2022   Procedure: Lower Extremity Angiography;  Surgeon: Renford Dills, MD;  Location: [facility];  Service: Cardiovascular;  Laterality: Left;   LUMBAR DISC SURGERY      Allergies: Allergies as of 07/06/2023 - Review Complete 07/06/2023  Allergen Reaction Noted   Codeine  02/02/2013   Latex  02/02/2013   Morphine and codeine  02/02/2013   Nickel  02/02/2013   Penicillins  02/02/2013   Atorvastatin Other (See Comments) 09/30/2021   Cerivastatin Other (See Comments) 09/30/2021   Fluvastatin Other (See Comments) 09/30/2021    Medications:  Current Facility-Administered Medications:    acetaminophen (TYLENOL) tablet 650 mg, 650 mg, Oral, Q6H PRN, Rosezetta Schlatter T, MD, 650 mg at 07/09/23 2143   acyclovir (ZOVIRAX) 660 mg in dextrose 5 % 100 mL IVPB, 660 mg, Intravenous, Q12H, Effie Shy, RPH, Last Rate: 113.2 mL/hr at 07/09/23 2148, 660 mg at 07/09/23 2148   enoxaparin (LOVENOX) injection 40 mg, 40 mg, Subcutaneous, Q24H, Mikey College T, MD, 40 mg at 07/09/23 1238   escitalopram (LEXAPRO) tablet 20 mg, 20 mg, Oral, Daily, Chipper Herb, Ping T, MD, 20 mg at 07/09/23 1007   fluticasone (FLONASE) 50 MCG/ACT nasal spray 2 spray, 2 spray, Each Nare, PRN, Emeline General, MD   gabapentin (NEURONTIN) capsule 800 mg, 800 mg, Oral, BID, 800 mg at  07/09/23 1449 **AND** gabapentin (NEURONTIN) capsule 1,600 mg, 1,600 mg, Oral, QHS, Belue, Lendon Collar, RPH, 1,600 mg at 07/09/23 2142   haloperidol lactate (HALDOL) injection 5 mg, 5 mg, Intravenous, Q6H PRN, Mikey College T, MD, 5 mg at 07/07/23 1810   HYDROmorphone (DILAUDID) injection 0.5 mg, 0.5 mg, Intravenous, Q4H PRN, Mikey College T, MD, 0.5 mg at 07/06/23 2146   insulin aspart (novoLOG) injection 0-15 Units, 0-15 Units, Subcutaneous, TID WC, Emeline General, MD,  3 Units at 07/09/23 1238   lidocaine (XYLOCAINE) 5 % ointment 1 Application, 1 Application, Topical, BID PRN, Mikey College T, MD, 1 Application at 07/09/23 2144   lidocaine (XYLOCAINE) injection 1 % - NO CHARGE, 5 mL, Subcutaneous, Once, Chipper Herb, Ping T, MD   losartan (COZAAR) tablet 25 mg, 25 mg, Oral, Daily, Chipper Herb, Ping T, MD, 25 mg at 07/09/23 1007   ondansetron (ZOFRAN) tablet 4 mg, 4 mg, Oral, Q6H PRN **OR** ondansetron (ZOFRAN) injection 4 mg, 4 mg, Intravenous, Q6H PRN, Chipper Herb, Ping T, MD   pantoprazole (PROTONIX) EC tablet 40 mg, 40 mg, Oral, Daily, Chipper Herb, Ping T, MD, 40 mg at 07/09/23 1007   rosuvastatin (CRESTOR) tablet 20 mg, 20 mg, Oral, Daily, Mikey College T, MD, 20 mg at 07/09/23 1007   Social History: Social History   Tobacco Use   Smoking status: Former    Current packs/day: 0.00    Types: Cigarettes    Quit date: 1982    Years since quitting: 42.8   Smokeless tobacco: Never  Vaping Use   Vaping status: Never Used  Substance Use Topics   Alcohol use: Yes    Comment: socially   Drug use: No    Family Medical History: Family History  Problem Relation Age of Onset   Diabetes Son    Breast cancer Neg Hx     Physical Examination: Vitals:   07/09/23 2031 07/10/23 0351  BP: (!) 169/79 (!) 146/81  Pulse: 79 69  Resp: 16 17  Temp: 97.9 F (36.6 C) 97.9 F (36.6 C)  SpO2: 98% 97%     General: Patient is well developed, well nourished, calm, collected, and in no apparent distress.  NEUROLOGICAL:  General: In no acute distress.   Awake, alert, oriented to person, place, and time.  Pupils equal round and reactive to light.  Facial tone is symmetric.  Tongue protrusion is midline.  There is no pronator drift.  Strength: Proximally patient has good strength on the left at least 4+ out of 5 in the left deltoid, however once we start moving down the myotome she has antigravity but weak and is noted in her elbow extension and flexion.  Her wrist extension is  particularly weak however her most notable weakness is approximately 1-2 out of 5 in left hand grip and intrinsic hand function.  Contralateral side is stronger.  She has hyperreflexia and spreading reflexes in her bilateral upper extremities, will clonus is equivocal, Babinski is equivocal, does have a right sided Hoffman's reflex noted.  Sensory is altered in a somewhat cape like distribution  Deferred gait testing  Imaging: Narrative & Impression  CLINICAL DATA:  Provided history: Myelopathy, acute, cervical spine. Concern for shingles related radiculopathy. Check for nerve root enhancement.   EXAM: MRI CERVICAL SPINE WITHOUT AND WITH CONTRAST   TECHNIQUE: Multiplanar and multiecho pulse sequences of the cervical spine, to include the craniocervical junction and cervicothoracic junction, were obtained without and with intravenous contrast.   CONTRAST:  6mL GADAVIST GADOBUTROL 1 MMOL/ML  IV SOLN   COMPARISON:  Report from cervical spine radiographs 06/17/2023 (images unavailable). Ultrasound of the head/neck soft tissues 07/09/2022.   FINDINGS: Alignment: Slight C5-C6 grade 1 retrolisthesis. Slight grade 1 anterolisthesis at C7-T1. 2 mm grade 1 anterolisthesis at T1-T2 and T2-T3.   Vertebrae: Cervical vertebral body height is maintained. Minimal degenerative endplate edema at T0-Z6 and C6-C7. No focal worrisome marrow lesion.   Cord: Flattening of the spinal cord at C4-C5, C5-C6 and C6-C7 as described below. Subtle T2 hyperintense signal abnormality is questioned within the left aspect of the spinal cord at the C4-C5 and C5-C6 levels (for instance as seen on series 5, image 9). Additionally, there is a suspected small focus of myelomalacia within the left aspect of the spinal cord at the C5-C6 level (series 8, image 18).   Posterior Fossa, vertebral arteries, paraspinal tissues: No abnormality identified within included portions of the posterior fossa. Flow voids  preserved within the imaged cervical vertebral arteries. No paraspinal mass or collection.   Disc levels:   Multilevel disc degeneration, greatest at C4-C5, C5-C6 and C6-C7 (moderate-to-advanced at these levels).   C2-C3: No significant disc herniation or spinal canal stenosis. Facet hypertrophy and arthrosis (greater on the left). Mild left neural foraminal narrowing   C3-C4: Disc bulge with minimal bilateral uncovertebral hypertrophy. Facet hypertrophy and arthrosis. Ligamentum flavum thickening. Mild spinal canal stenosis. Mild relative bilateral neural foraminal narrowing.   C4-C5: Posterior disc osteophyte complex with bilateral disc osteophyte ridge/uncinate hypertrophy. Facet hypertrophy. Ligamentum flavum thickening. Severe spinal canal stenosis with spinal cord flattening. Subtle T2 hyperintense signal abnormality is questioned within the left aspect of the spinal cord (for instance as seen on series 5, image 9). Severe bilateral neural foraminal narrowing.   C5-C6: Slight grade 1 retrolisthesis. Disc bulge with bilateral disc osteophyte ridge/uncinate hypertrophy. Facet hypertrophy. Ligamentum flavum thickening. Severe spinal canal stenosis with spinal cord flattening. Subtle T2 hyperintense signal abnormality is questioned within the left aspect of the spinal cord (for instance as seen on series 5, image 9). Additionally, there is a suspected small focus of myelomalacia within the left aspect of the spinal cord at this level (series 8, image 18). Severe bilateral neural foraminal narrowing.   C6-C7: Small central disc protrusion. Uncovertebral hypertrophy (greater on the right). Mild facet arthrosis. Ligamentum flavum thickening. Mild-to-moderate spinal canal stenosis (with mild flattening of the ventral spinal cord). Bilateral neural foraminal narrowing (moderate/severe right, mild left).   C7-T1: Slight grade 1 anterolisthesis. Moderate facet arthrosis.  No significant spinal canal or foraminal stenosis.   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   Impressions #3, #4, #5 and #6 will be called to the ordering clinician or representative by the Radiologist Assistant, and communication documented in the PACS or [employer].   IMPRESSION: 1. No pathologic nerve root enhancement is identified. 2. Cervical spondylosis as outlined. 3. At C4-C5, there is moderate-to-advanced disc degeneration with multifactorial severe spinal canal stenosis (and spinal cord flattening). Severe bilateral neural foraminal narrowing. 4. At C5-C6, there is moderate-to-advanced disc degeneration. Multifactorial severe spinal canal stenosis (with spinal cord flattening). Severe bilateral neural foraminal narrowing. 5. Subtle T2 hyperintense signal abnormality is questioned within the left aspect of the spinal cord at the C4-C5 and C5-C6 levels, suspicious for mild spondylotic cord edema. 6. Suspected superimposed small focus of myelomalacia within the left aspect of the spinal cord at C5-C6. 7. At C6-C7, there is moderate-to-advanced disc degeneration. A central disc protrusion contributes to multifactorial mild-to-moderate spinal canal stenosis (with  mild flattening of the ventral spinal cord). Bilateral neural foraminal narrowing (moderate/severe right, mild left). 8. No more than mild spinal canal narrowing, and no significant foraminal stenosis, at the remaining cervical levels.     Electronically Signed   By: Jackey Loge D.O.   On: 07/08/2023 18:17    I have personally reviewed the images and agree with the above interpretation.  Labs:    Latest Ref Rng & Units 07/10/2023    4:22 AM 07/09/2023    3:10 AM 07/08/2023    4:05 AM  CBC  WBC 4.0 - 10.5 K/uL 2.9  3.9  4.5   Hemoglobin 12.0 - 15.0 g/dL 7.8  8.2  8.3   Hematocrit 36.0 - 46.0 % 22.8  24.8  23.8   Platelets 150 - 400 K/uL 131  132  128        Assessment  and Plan: Ms. Sennett is a pleasant 77 y.o. female with a recent admission for encephalopathy, previously had a multiple week presentation consistent with possible shingles.  However she has some symptoms that did not completely align with a shingles outbreak.  She had worsening gait, had some mentation changes, there was concern for zoster encephalitis, however patient spontaneously improved.  Now that she was more coherent she was able to give history of left-sided upper extremity issues that predated even the shingles outbreak.  She was also having worsening walking before then as well.  She does notice that she was dropping things often.  MRI of the cervical spine does demonstrate significant cervical stenosis with some T2 signal change noted in the cord.  It is possible that she is had progressive cervical myelopathy and also had a shingles outbreak in a similar timeframe.  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  This especially notable given the fact that she has not had improvement in her strength or weakness but did have a significant improvement in her recent mentation.  Although cervical myelopathy would not explain her entire presentation with the mental status changes, we do feel like this is an active issue for her.  We like to follow her up in clinic after some occupational therapy, her weakness has been present for at least 1 month so surgery is no longer urgent at this time.  We discussed going straight to surgery for decompression given the compression and myelopathy, however patient would like to try some conservative measures prior to intervention.  We like to see her in clinic with flexion-extension x-rays of her cervical spine approximately 2 to 4 weeks to follow-up with her occupational therapy and see which trajectory she is going in terms of her gait and left upper extremity function.   Lovenia Kim, MD/MSCR Dept. of Neurosurgery

## 2023-07-10 NOTE — Progress Notes (Signed)
Progress Note   Patient: Theresa Ferguson:[id number] DOB: 1946/04/21 DOA: 07/06/2023     2 DOS: the patient was seen and examined on 07/10/2023    Brief hospital course: Theresa Ferguson is a 77 y.o. female with medical history significant of intermittent vertigo, HTN, HLD, right breast cancer status post lumpectomy and radioactive seed treatment, PVD on Plavix, IIDM, chronic peripheral neuropathy on gabapentin, brought in by family member for evaluation of worsening of mentations.   ED Course: Afebrile, tachycardia blood pressure stable nonhypoxic.  CT head negative for acute findings.  Blood work showed K3.0, bicarb 16, creatinine 0.8, VBG 7.39/33/33.   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 EEG read by neurology did not show any seizures Continue telemetry monitoring Diet advanced from n.p.o. 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 Katrinka Blazing At this point no surgical intervention is being planned but neurosurgeon has seen patient and has recommended outpatient follow-up which patient has agreed Continue to monitor   Left arm and hand shingle -No acute findings, crusted lesion on left hand otherwise no signs of active lesions.   Severe hypokalemia-improved Continue repletion and monitoring   Vertigo Continue meclizine    Compensated non-anion gap metabolic acidosis and respiratory alkalosis -Etiology unknown, and the patient had no diarrhea as per family. Monitor BMP closely   IIDM Holding metformin while inpatient Continue insulin therapy   PVD Continue Plavix   HTN Continue losartan     DVT prophylaxis: Lovenox Code Status: Full code Family Communication: Husband at bedside   Disposition Plan: Expect less than 2 midnight hospital stay   Consults called: Neurology   Admission status: Inpatient   Subjective:  Patient seen at bedside together with neurologist She does not want to go to rehab Still waiting to make final decision on IV acyclovir therapy before discharge   Physical Exam: Eyes: PERRL, lids and conjunctivae normal ENMT: Mucous membranes are moist. Posterior pharynx clear of any exudate or lesions.Normal dentition.  Neck: normal, supple, no masses, no thyromegaly Respiratory: clear to auscultation bilaterally, no wheezing, no crackles. Normal respiratory effort. No accessory muscle use.  Cardiovascular: Regular rate and rhythm, no murmurs / rubs / gallops. No extremity edema. 2+ pedal pulses. No carotid bruits.  Abdomen: no tenderness, no masses palpated. No hepatosplenomegaly. Bowel sounds positive.  Musculoskeletal: Numbness noted to the left upper extremity as well as 4/5 weakness Skin: Crusted small lesion on the left palm Neurologic: Confusion is much improved Data Reviewed:     Disposition: Status is: Patient     Time spent: 39 minutes   Data Reviewed:     Vitals:   07/09/23 1507 07/09/23 2031 07/10/23 0351 07/10/23 0803  BP: 137/74 (!) 169/79 (!) 146/81 (!) 178/87  Pulse: 70 79 69 71  Resp: 18 16 17 18   Temp: 98.2 F (36.8 C) 97.9 F (36.6 C) 97.9 F (36.6 C) 98.3 F (36.8 C)  TempSrc: Oral  Oral Oral  SpO2: 97% 98% 97% 97%  Weight:      Height:         Author: Loyce Dys, MD 07/10/2023 4:02 PM  For on call review [email or web address].

## 2023-07-10 NOTE — TOC Progression Note (Signed)
Transition of Care [facility]) - Progression Note    Patient Details  Name: CHAVONE CRUTCHER MRN: [id number] Date of Birth: 1945/09/11  [facility] CM/SW Contact  Kemper Durie, RN Phone Number: 07/10/2023, 12:20 PM  Clinical Narrative:     Notified planning for discharge home on Monday with IV antibiotics.  Pam with [employer] notified, and home health referral for PT/OT/RN accepted by Kandee Keen with [facility] as patient had no preference of agencies.  TOC will continue to follow.  Expected Discharge Plan: Skilled Nursing Facility Barriers to Discharge: Continued Medical Work up  Expected Discharge Plan and Services       Living arrangements for the past 2 months: Single Family Home                                       Social Determinants of Health (SDOH) Interventions SDOH Screenings   Food Insecurity: Patient Unable To Answer (07/06/2023)  Housing: Patient Unable To Answer (07/06/2023)  Transportation Needs: Patient Unable To Answer (07/06/2023)  Utilities: Patient Unable To Answer (07/06/2023)  Tobacco Use: Medium Risk (07/06/2023)    Readmission Risk Interventions     No data to display

## 2023-07-10 NOTE — Progress Notes (Signed)
Subjective: Discussed with her further regarding her left arm weakness.  She first mention it to me 2 days ago, though it has been present for longer.  She states that it improved and then we worsened, which is why she mentioned it.  It did appear to worsen some between the seventh and the eighth, but it has been stable overnight.  Exam: Vitals:   07/10/23 0351 07/10/23 0803  BP: (!) 146/81 (!) 178/87  Pulse: 69 71  Resp: 17 18  Temp: 97.9 F (36.6 C) 98.3 F (36.8 C)  SpO2: 97% 97%   Gen: In bed, NAD Resp: non-labored breathing, no acute distress Abd: soft, nt  Neuro: MS: Awake, alert, speech is coherent.  She has some difficulty with serial sevens. CN: Pupils equal round and reactive, EOMI visual fields full Motor:      Right   Shoulder Abduction  4/5   Elbow Flexion   4-/5   Elbow Extension  4-/5   Wrist Flexion   4-/5   Wrist Extension  3/5   Interossei   2/5    5/5 in the left leg, no facial weakness Sensory: She has numbness crossing multiple dermatomes in the left arm extending proximally, she states that this is the same as it was yesterday but yesterday she described more of a ulnar distribution numbness. DTR: 2+ and symmetric at the knees, absent at the ankles.  She has 2-3+ in the right biceps, 1+ in the left biceps  Pertinent Labs: Results for orders placed or performed during the hospital encounter of 07/06/23 (from the past 24 hour(s))  Glucose, capillary     Status: Abnormal   Collection Time: 07/09/23 11:36 AM  Result Value Ref Range   Glucose-Capillary 151 (H) 70 - 99 mg/dL  Glucose, capillary     Status: Abnormal   Collection Time: 07/09/23  5:04 PM  Result Value Ref Range   Glucose-Capillary 107 (H) 70 - 99 mg/dL  Glucose, capillary     Status: Abnormal   Collection Time: 07/09/23  8:34 PM  Result Value Ref Range   Glucose-Capillary 146 (H) 70 - 99 mg/dL  CBC with Differential/Platelet     Status: Abnormal   Collection Time: 07/10/23  4:22 AM   Result Value Ref Range   WBC 2.9 (L) 4.0 - 10.5 K/uL   RBC 2.39 (L) 3.87 - 5.11 MIL/uL   Hemoglobin 7.8 (L) 12.0 - 15.0 g/dL   HCT 96.0 (L) 45.4 - 09.8 %   MCV 95.4 80.0 - 100.0 fL   MCH 32.6 26.0 - 34.0 pg   MCHC 34.2 30.0 - 36.0 g/dL   RDW 11.9 14.7 - 82.9 %   Platelets 131 (L) 150 - 400 K/uL   nRBC 0.0 0.0 - 0.2 %   Neutrophils Relative % 36 %   Neutro Abs 1.0 (L) 1.7 - 7.7 K/uL   Lymphocytes Relative 50 %   Lymphs Abs 1.4 0.7 - 4.0 K/uL   Monocytes Relative 11 %   Monocytes Absolute 0.3 0.1 - 1.0 K/uL   Eosinophils Relative 3 %   Eosinophils Absolute 0.1 0.0 - 0.5 K/uL   Basophils Relative 0 %   Basophils Absolute 0.0 0.0 - 0.1 K/uL   Immature Granulocytes 0 %   Abs Immature Granulocytes 0.01 0.00 - 0.07 K/uL  Basic metabolic panel     Status: Abnormal   Collection Time: 07/10/23  4:22 AM  Result Value Ref Range   Sodium 142 135 - 145 mmol/L  Potassium 3.6 3.5 - 5.1 mmol/L   Chloride 113 (H) 98 - 111 mmol/L   CO2 21 (L) 22 - 32 mmol/L   Glucose, Bld 110 (H) 70 - 99 mg/dL   BUN 17 8 - 23 mg/dL   Creatinine, Ser 1.61 0.44 - 1.00 mg/dL   Calcium 8.8 (L) 8.9 - 10.3 mg/dL   GFR, Estimated 60 (L) >60 mL/min   Anion gap 8 5 - 15  Glucose, capillary     Status: Abnormal   Collection Time: 07/10/23  8:05 AM  Result Value Ref Range   Glucose-Capillary 119 (H) 70 - 99 mg/dL   Ammonia on admission 10 UA - negative UDS - negative TSH - 1.18 Procalcitonin < 0.1 VZV skin scrping - negative.   CSF WBC 42 CSF RBC 62 CSF protein 49 CSF glucose 77 Meningoencephalitis panel-negative Autoimmune encephalitis panel on CSF -pending   Impression: 77 year old female who presented initially with progressive encephalopathy following a 3-week episode concerning for shingles.    Her presentation is quite unusual, she had several episodes of transient vertigo followed by an episode concerning for shingles which was treated with Valtrex starting 10/19.  She has had worsening gait  over this timeframe, needing to use a walker at home.  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  LP did confirm a pleocytosis, however the patient markedly improved over the course of 48 hours.  She now has progressing numbness and weakness of the left arm which is not new, but did have some worsening after admission.  MRI of her cervical spine does not show any enhancement, but does show some T2 change in association with degenerative compression.  The distribution of her numbness and weakness does not fit a clear dermatomal pattern, so I think VZV related radiculopathy is less likely, but VZV related myelitis is still possible, though less likely given lack of enhancement.  I do wonder if the pain that initially got her diagnosed with shingles could have been radicular in nature associated with progressive cervical spine disease, but this does not necessarily explain her pleocytosis nor the eruption that she experienced.  Repeat VZV testing on her CSF is again negative, but still pending on her skin scraping.  If her skin scraping were positive, I would favor continuing IV acyclovir for a full 10-day course, given that the PCR testing is only of moderate sensitivity.  Recommendations: 1) f/u autoimmune encephalitis panel 2) follow-up scraping from her lesion on her hand, if this is negative, could likely stop acyclovir otherwise would continue until 11/14 3) PT, OT 4) neurology will continue to follow  Ritta Slot, MD [facility] [phone]  If 7pm- 7am, please page neurology on call as listed in AMION.

## 2023-07-11 DIAGNOSIS — G9340 Encephalopathy, unspecified: Secondary | ICD-10-CM | POA: Diagnosis not present

## 2023-07-11 DIAGNOSIS — E872 Acidosis, unspecified: Secondary | ICD-10-CM | POA: Diagnosis not present

## 2023-07-11 DIAGNOSIS — E876 Hypokalemia: Secondary | ICD-10-CM | POA: Diagnosis not present

## 2023-07-11 DIAGNOSIS — R4182 Altered mental status, unspecified: Secondary | ICD-10-CM | POA: Diagnosis not present

## 2023-07-11 LAB — CBC WITH DIFFERENTIAL/PLATELET
Abs Immature Granulocytes: 0 10*3/uL (ref 0.00–0.07)
Basophils Absolute: 0 10*3/uL (ref 0.0–0.1)
Basophils Relative: 1 %
Eosinophils Absolute: 0.1 10*3/uL (ref 0.0–0.5)
Eosinophils Relative: 2 %
HCT: 23.7 % — ABNORMAL LOW (ref 36.0–46.0)
Hemoglobin: 7.9 g/dL — ABNORMAL LOW (ref 12.0–15.0)
Immature Granulocytes: 0 %
Lymphocytes Relative: 41 %
Lymphs Abs: 1.4 10*3/uL (ref 0.7–4.0)
MCH: 32.6 pg (ref 26.0–34.0)
MCHC: 33.3 g/dL (ref 30.0–36.0)
MCV: 97.9 fL (ref 80.0–100.0)
Monocytes Absolute: 0.4 10*3/uL (ref 0.1–1.0)
Monocytes Relative: 12 %
Neutro Abs: 1.5 10*3/uL — ABNORMAL LOW (ref 1.7–7.7)
Neutrophils Relative %: 44 %
Platelets: 138 10*3/uL — ABNORMAL LOW (ref 150–400)
RBC: 2.42 MIL/uL — ABNORMAL LOW (ref 3.87–5.11)
RDW: 14 % (ref 11.5–15.5)
WBC: 3.4 10*3/uL — ABNORMAL LOW (ref 4.0–10.5)
nRBC: 0 % (ref 0.0–0.2)

## 2023-07-11 LAB — ANGIOTENSIN CONVERTING ENZYME: Angiotensin-Converting Enzyme: 15 U/L (ref 14–82)

## 2023-07-11 LAB — GLUCOSE, CAPILLARY
Glucose-Capillary: 106 mg/dL — ABNORMAL HIGH (ref 70–99)
Glucose-Capillary: 192 mg/dL — ABNORMAL HIGH (ref 70–99)
Glucose-Capillary: 153 mg/dL — ABNORMAL HIGH (ref 70–99)
Glucose-Capillary: 117 mg/dL — ABNORMAL HIGH (ref 70–99)

## 2023-07-11 LAB — CULTURE, BLOOD (ROUTINE X 2)
Culture: NO GROWTH
Culture: NO GROWTH
Special Requests: ADEQUATE
Special Requests: ADEQUATE

## 2023-07-11 LAB — ANAEROBIC CULTURE W GRAM STAIN

## 2023-07-11 LAB — VARICELLA ZOSTER ANTIBODY, IGG: Varicella IgG: REACTIVE

## 2023-07-11 MED ORDER — AMLODIPINE BESYLATE 5 MG PO TABS
5.0000 mg | ORAL_TABLET | Freq: Every day | ORAL | Status: DC
  Administered 2023-07-11 – 2023-07-12 (×2): 5 mg via ORAL
  Filled 2023-07-11 (×2): qty 1

## 2023-07-11 MED ORDER — POLYETHYLENE GLYCOL 3350 17 G PO PACK
17.0000 g | PACK | Freq: Every day | ORAL | Status: DC
  Administered 2023-07-11 – 2023-07-12 (×2): 17 g via ORAL
  Filled 2023-07-11 (×2): qty 1

## 2023-07-11 NOTE — Progress Notes (Signed)
Subjective: No changes in left arm weakness.   Exam: Vitals:   07/11/23 0416 07/11/23 0819  BP: (!) 151/89 (!) 178/94  Pulse: 77 80  Resp: 16 16  Temp: 98.1 F (36.7 C) 97.7 F (36.5 C)  SpO2: 97% 99%   Gen: In bed, NAD Resp: non-labored breathing, no acute distress Abd: soft, nt  Neuro: MS: Awake, alert, speech is coherent.  She has some difficulty with serial sevens. CN: Pupils equal round and reactive, EOMI visual fields full Motor:      Right   Shoulder Abduction  4/5   Elbow Flexion   4-/5   Elbow Extension  4-/5   Wrist Flexion   4-/5   Wrist Extension  3/5   Interossei   2/5    5/5 in the left leg, no facial weakness Sensory: She has numbness crossing multiple dermatomes in the left arm extending proximally, she states that this is stable DTR: 2+ and symmetric at the knees, absent at the ankles.  She has 2-3+ in the right biceps, 1+ in the left biceps  Pertinent Labs: Results for orders placed or performed during the hospital encounter of 07/06/23 (from the past 24 hour(s))  Glucose, capillary     Status: Abnormal   Collection Time: 07/10/23 11:45 AM  Result Value Ref Range   Glucose-Capillary 170 (H) 70 - 99 mg/dL  Glucose, capillary     Status: None   Collection Time: 07/10/23  4:41 PM  Result Value Ref Range   Glucose-Capillary 80 70 - 99 mg/dL  Glucose, capillary     Status: Abnormal   Collection Time: 07/10/23  8:00 PM  Result Value Ref Range   Glucose-Capillary 165 (H) 70 - 99 mg/dL  CBC with Differential/Platelet     Status: Abnormal   Collection Time: 07/11/23  3:28 AM  Result Value Ref Range   WBC 3.4 (L) 4.0 - 10.5 K/uL   RBC 2.42 (L) 3.87 - 5.11 MIL/uL   Hemoglobin 7.9 (L) 12.0 - 15.0 g/dL   HCT 78.4 (L) 69.6 - 29.5 %   MCV 97.9 80.0 - 100.0 fL   MCH 32.6 26.0 - 34.0 pg   MCHC 33.3 30.0 - 36.0 g/dL   RDW 28.4 13.2 - 44.0 %   Platelets 138 (L) 150 - 400 K/uL   nRBC 0.0 0.0 - 0.2 %   Neutrophils Relative % 44 %   Neutro Abs 1.5 (L) 1.7 -  7.7 K/uL   Lymphocytes Relative 41 %   Lymphs Abs 1.4 0.7 - 4.0 K/uL   Monocytes Relative 12 %   Monocytes Absolute 0.4 0.1 - 1.0 K/uL   Eosinophils Relative 2 %   Eosinophils Absolute 0.1 0.0 - 0.5 K/uL   Basophils Relative 1 %   Basophils Absolute 0.0 0.0 - 0.1 K/uL   Immature Granulocytes 0 %   Abs Immature Granulocytes 0.00 0.00 - 0.07 K/uL  Glucose, capillary     Status: Abnormal   Collection Time: 07/11/23  8:15 AM  Result Value Ref Range   Glucose-Capillary 106 (H) 70 - 99 mg/dL   Ammonia on admission 10 UA - negative UDS - negative TSH - 1.18 Procalcitonin < 0.1 VZV skin scrping - POSITIVE   CSF WBC 42 CSF RBC 62 CSF protein 49 CSF glucose 77 Meningoencephalitis panel(including VZV)-negative Autoimmune encephalitis panel on CSF -pending VZV PCR on CSF - negative  Impression: 77 year old female who presented initially with progressive encephalopathy following a 3-week episode concerning for shingles.  Her presentation is quite unusual, she had several episodes of transient vertigo followed by shingles which was treated with Valtrex starting 10/19.  She has had worsening gait over this timeframe, needing to use a walker at home.  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  LP did confirm a pleocytosis, however the patient markedly improved over the course of 48 hours. If this was encephalitis, either autoimmune or VZV, I would not have expected that rapid improvement. I think delirium secondary to whatever unidentified process caused her fever is most likely.   She now has progressing numbness and weakness of the left arm which is not new, but did have some worsening after admission.  MRI of her cervical spine does not show any enhancement, but does show some T2 change in association with degenerative compression.  The distribution of her numbness and  weakness does not fit a clear dermatomal pattern, so I think VZV related radiculopathy is less likely, but VZV related myelitis is still possible, though less likely given lack of enhancement.  With confirmation her skin eruption was VZV, and poor sensitivity of VZV PCR for CNS disease, I would favor finishing a course of acyclovir in case it is playing any role in her left arm weakness.    Recommendations: 1) f/u autoimmune encephalitis panel 2) continue IV acyclovir through 11/14.  3) f/u with outpatient neurology.  4) neurology will be available as needed, please call with further questions or concerns.   Ritta Slot, MD [facility] [phone]  If 7pm- 7am, please page neurology on call as listed in AMION.

## 2023-07-11 NOTE — Progress Notes (Signed)
Progress Note   Patient: Theresa Ferguson WJX:[id number] DOB: 1945-09-01 DOA: 07/06/2023     3 DOS: the patient was seen and examined on 07/11/2023    Brief hospital course: Theresa Ferguson is a 77 y.o. female with medical history significant of intermittent vertigo, HTN, HLD, right breast cancer status post lumpectomy and radioactive seed treatment, PVD on Plavix, IIDM, chronic peripheral neuropathy on gabapentin, brought in by family member for evaluation of worsening of mentations.   ED Course: Afebrile, tachycardia blood pressure stable nonhypoxic.  CT head negative for acute findings.  Blood work showed K3.0, bicarb 16, creatinine 0.8, VBG 7.39/33/33.   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 EEG read by neurology did not show any seizures Continue telemetry monitoring Diet advanced from n.p.o. 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 Katrinka Blazing At this point no surgical intervention is being planned but neurosurgeon has seen patient and has recommended outpatient follow-up which patient has agreed Continue to monitor   Left arm and hand shingle -No acute findings, crusted lesion on left hand otherwise no signs of active lesions.   Severe hypokalemia-improved Continue repletion and monitoring    Vertigo Continue meclizine   Compensated non-anion gap metabolic acidosis and respiratory alkalosis -Etiology unknown, and the patient had no diarrhea as per family. Monitor BMP closely   IIDM Holding metformin while inpatient Continue insulin therapy   PVD Continue Plavix   HTN Continue losartan     DVT prophylaxis: Lovenox Code Status: Full code Family Communication: Husband at bedside   Disposition Plan: Expect less than 2 midnight hospital stay   Consults called: Neurology   Admission status: Inpatient   Subjective:  Patient seen and examined at bedside this morning Denies nausea vomiting abdominal pain chest pain  Physical Exam: Eyes: PERRL, lids and conjunctivae normal ENMT: Mucous membranes are moist. Posterior pharynx clear of any exudate or lesions.Normal dentition.  Neck: normal, supple, no masses, no thyromegaly Respiratory: clear to auscultation bilaterally, no wheezing, no crackles. Normal respiratory effort. No accessory muscle use.  Cardiovascular: Regular rate and rhythm, no murmurs / rubs / gallops. No extremity edema. 2+ pedal pulses. No carotid bruits.  Abdomen: no tenderness, no masses palpated. No hepatosplenomegaly. Bowel sounds positive.  Musculoskeletal: Numbness noted to the left upper extremity as well as 4/5 weakness Skin: Crusted small lesion on the left palm Neurologic: Fully alert and awake     Disposition: Status is: Patient     Time spent: 37 minutes   Data Reviewed:      Latest Ref Rng & Units 07/10/2023    4:22 AM 07/09/2023    3:10 AM 07/08/2023    4:05 AM  BMP  Glucose 70 - 99 mg/dL 213  086  578   BUN 8 - 23 mg/dL 17  19  21    Creatinine 0.44 - 1.00 mg/dL 4.69  6.29  5.28  Sodium 135 - 145 mmol/L 142  139  144   Potassium 3.5 - 5.1 mmol/L 3.6  3.6  3.9   Chloride 98 - 111 mmol/L 113  109  122   CO2 22 - 32 mmol/L 21  20  17    Calcium 8.9 - 10.3 mg/dL 8.8  8.5  8.6          Latest Ref Rng & Units 07/11/2023     3:28 AM 07/10/2023    4:22 AM 07/09/2023    3:10 AM  CBC  WBC 4.0 - 10.5 K/uL 3.4  2.9  3.9   Hemoglobin 12.0 - 15.0 g/dL 7.9  7.8  8.2   Hematocrit 36.0 - 46.0 % 23.7  22.8  24.8   Platelets 150 - 400 K/uL 138  131  132        Vitals:   07/11/23 0117 07/11/23 0416 07/11/23 0819 07/11/23 0953  BP: (!) 159/80 (!) 151/89 (!) 178/94 (!) 152/76  Pulse: 75 77 80 89  Resp: 16 16 16 18   Temp:  98.1 F (36.7 C) 97.7 F (36.5 C) 98.2 F (36.8 C)  TempSrc:   Oral   SpO2: 95% 97% 99% 96%  Weight:      Height:         Author: Loyce Dys, MD 07/11/2023 2:43 PM  For on call review [email or web address].

## 2023-07-11 NOTE — TOC Progression Note (Signed)
Transition of Care [facility]) - Progression Note    Patient Details  Name: Theresa Ferguson MRN: [id number] Date of Birth: 03-03-1946  Transition of Care [facility]) CM/SW Contact  Kemper Durie, RN Phone Number: 07/11/2023, 10:54 AM  Clinical Narrative:     Notified of possible discharge today if [facility] able to start today.  Contacted Pam with [facility] and Kandee Keen with [facility], they are unable to start services until tomorrow.    Expected Discharge Plan: Skilled Nursing Facility Barriers to Discharge: Continued Medical Work up  Expected Discharge Plan and Services       Living arrangements for the past 2 months: Single Family Home                                       Social Determinants of Health (SDOH) Interventions SDOH Screenings   Food Insecurity: Patient Unable To Answer (07/06/2023)  Housing: Patient Unable To Answer (07/06/2023)  Transportation Needs: Patient Unable To Answer (07/06/2023)  Utilities: Patient Unable To Answer (07/06/2023)  Tobacco Use: Medium Risk (07/06/2023)    Readmission Risk Interventions     No data to display

## 2023-07-11 NOTE — Plan of Care (Signed)

## 2023-07-12 DIAGNOSIS — E876 Hypokalemia: Secondary | ICD-10-CM | POA: Diagnosis not present

## 2023-07-12 DIAGNOSIS — D729 Disorder of white blood cells, unspecified: Secondary | ICD-10-CM | POA: Diagnosis not present

## 2023-07-12 DIAGNOSIS — E872 Acidosis, unspecified: Secondary | ICD-10-CM | POA: Diagnosis not present

## 2023-07-12 DIAGNOSIS — B021 Zoster meningitis: Secondary | ICD-10-CM

## 2023-07-12 DIAGNOSIS — G0491 Myelitis, unspecified: Secondary | ICD-10-CM | POA: Diagnosis not present

## 2023-07-12 DIAGNOSIS — R4182 Altered mental status, unspecified: Secondary | ICD-10-CM | POA: Diagnosis not present

## 2023-07-12 LAB — CBC WITH DIFFERENTIAL/PLATELET
Abs Immature Granulocytes: 0.01 10*3/uL (ref 0.00–0.07)
Basophils Absolute: 0 10*3/uL (ref 0.0–0.1)
Basophils Relative: 0 %
Eosinophils Absolute: 0.1 10*3/uL (ref 0.0–0.5)
Eosinophils Relative: 1 %
HCT: 24.1 % — ABNORMAL LOW (ref 36.0–46.0)
Hemoglobin: 8.2 g/dL — ABNORMAL LOW (ref 12.0–15.0)
Immature Granulocytes: 0 %
Lymphocytes Relative: 44 %
Lymphs Abs: 1.5 10*3/uL (ref 0.7–4.0)
MCH: 32.4 pg (ref 26.0–34.0)
MCHC: 34 g/dL (ref 30.0–36.0)
MCV: 95.3 fL (ref 80.0–100.0)
Monocytes Absolute: 0.4 10*3/uL (ref 0.1–1.0)
Monocytes Relative: 12 %
Neutro Abs: 1.5 10*3/uL — ABNORMAL LOW (ref 1.7–7.7)
Neutrophils Relative %: 43 %
Platelets: 142 10*3/uL — ABNORMAL LOW (ref 150–400)
RBC: 2.53 MIL/uL — ABNORMAL LOW (ref 3.87–5.11)
RDW: 14 % (ref 11.5–15.5)
WBC: 3.5 10*3/uL — ABNORMAL LOW (ref 4.0–10.5)
nRBC: 0 % (ref 0.0–0.2)

## 2023-07-12 LAB — CREATININE, SERUM
Creatinine, Ser: 0.88 mg/dL (ref 0.44–1.00)
GFR, Estimated: >60 mL/min (ref >60)

## 2023-07-12 LAB — GLUCOSE, CAPILLARY: Glucose-Capillary: 119 mg/dL — ABNORMAL HIGH (ref 70–99)

## 2023-07-12 MED ORDER — VALACYCLOVIR HCL 500 MG PO TABS
1000.0000 mg | ORAL_TABLET | Freq: Three times a day (TID) | ORAL | Status: DC
  Filled 2023-07-12: qty 2

## 2023-07-12 MED ORDER — POLYETHYLENE GLYCOL 3350 17 G PO PACK: 17.0000 g | PACK | Freq: Every day | ORAL | 0 refills | Status: DC

## 2023-07-12 MED ORDER — VALACYCLOVIR HCL 1 G PO TABS: 1000.0000 mg | ORAL_TABLET | Freq: Three times a day (TID) | ORAL | 0 refills | Status: AC

## 2023-07-12 MED ORDER — AMLODIPINE BESYLATE 5 MG PO TABS: 5.0000 mg | ORAL_TABLET | Freq: Every day | ORAL | 0 refills | Status: DC

## 2023-07-12 MED ORDER — SODIUM CHLORIDE 0.9 % IV SOLN
INTRAVENOUS | Status: DC
Start: 2023-07-12 — End: 2023-07-12

## 2023-07-12 NOTE — Plan of Care (Signed)

## 2023-07-12 NOTE — TOC Transition Note (Addendum)
Transition of Care [facility]) - CM/SW Discharge Note   Patient Details  Name: Theresa Ferguson MRN: [id number] Date of Birth: [birth year]  Transition [facility]) CM/SW Contact:  Allena Katz, LCSW Phone Number: 07/12/2023, 11:38 AM   Clinical Narrative:   Pt no longer needs infusion. ID has changed recommendations to PO antibiotics. Pt would still like to use HH. Kandee Keen with [facility] has accepted referral for PT/OT.   Final next level of care: Home w Home Health Services Barriers to Discharge: Barriers Resolved   Patient Goals and CMS Choice CMS Medicare.gov Compare Post Acute Care list provided to:: Patient Choice offered to / list presented to : Spouse  Discharge Placement                         Discharge Plan and Services Additional resources added to the After Visit Summary for                            [facility] Arranged: PT, OT HH Agency: [facility]        Social Determinants of Health (SDOH) Interventions SDOH Screenings   Food Insecurity: Patient Unable To Answer (07/06/2023)  Housing: Patient Unable To Answer (07/06/2023)  Transportation Needs: Patient Unable To Answer (07/06/2023)  Utilities: Patient Unable To Answer (07/06/2023)  Tobacco Use: Medium Risk (07/06/2023)     Readmission Risk Interventions     No data to display

## 2023-07-12 NOTE — Progress Notes (Signed)
Subjective: No new complaints   Antibiotics:  Anti-infectives (From admission, onward)    Start     Dose/Rate Route Frequency Ordered Stop   07/12/23 1600  valACYclovir (VALTREX) tablet 1,000 mg        1,000 mg Oral 3 times daily 07/12/23 1032 07/17/23 1559   07/08/23 1000  acyclovir (ZOVIRAX) 660 mg in dextrose 5 % 100 mL IVPB  Status:  Discontinued        660 mg 113.2 mL/hr over 60 Minutes Intravenous Every 12 hours 07/07/23 1028 07/07/23 1034   07/08/23 1000  vancomycin (VANCOCIN) IVPB 1000 mg/200 mL premix  Status:  Discontinued        1,000 mg 200 mL/hr over 60 Minutes Intravenous Every 24 hours 07/07/23 1038 07/07/23 1518   07/07/23 2200  acyclovir (ZOVIRAX) 660 mg in dextrose 5 % 100 mL IVPB  Status:  Discontinued        660 mg 113.2 mL/hr over 60 Minutes Intravenous Every 12 hours 07/07/23 1034 07/12/23 1032   07/07/23 2100  vancomycin (VANCOCIN) IVPB 1000 mg/200 mL premix  Status:  Discontinued       Placed in "Followed by" Linked Group   1,000 mg 200 mL/hr over 60 Minutes Intravenous Every 24 hours 07/06/23 2019 07/06/23 2032   07/07/23 1800  meropenem (MERREM) 2 g in sodium chloride 0.9 % 100 mL IVPB  Status:  Discontinued        2 g 280 mL/hr over 30 Minutes Intravenous Every 12 hours 07/07/23 1028 07/07/23 1518   07/07/23 1100  vancomycin (VANCOREADY) IVPB 500 mg/100 mL       Placed in "Followed by" Linked Group   500 mg 100 mL/hr over 60 Minutes Intravenous  Once 07/07/23 1038 07/07/23 1237   07/07/23 0900  vancomycin (VANCOREADY) IVPB 500 mg/100 mL  Status:  Discontinued       Placed in "Followed by" Linked Group   500 mg 100 mL/hr over 60 Minutes Intravenous Every 12 hours 07/06/23 2036 07/07/23 1038   07/06/23 2100  vancomycin (VANCOREADY) IVPB 1500 mg/300 mL  Status:  Discontinued       Placed in "Followed by" Linked Group   1,500 mg 150 mL/hr over 120 Minutes Intravenous  Once 07/06/23 2019 07/06/23 2032   07/06/23 2100  meropenem (MERREM) 2 g in  sodium chloride 0.9 % 100 mL IVPB  Status:  Discontinued        2 g 280 mL/hr over 30 Minutes Intravenous Every 8 hours 07/06/23 2019 07/07/23 1028   07/06/23 2100  vancomycin (VANCOREADY) IVPB 1750 mg/350 mL       Placed in "Followed by" Linked Group   1,750 mg 175 mL/hr over 120 Minutes Intravenous  Once 07/06/23 2036 07/07/23 0241   07/06/23 1800  acyclovir (ZOVIRAX) 660 mg in dextrose 5 % 100 mL IVPB  Status:  Discontinued        660 mg 113.2 mL/hr over 60 Minutes Intravenous Every 8 hours 07/06/23 1715 07/07/23 1028       Medications: Scheduled Meds:  amLODipine  5 mg Oral Daily   enoxaparin (LOVENOX) injection  40 mg Subcutaneous Q24H   escitalopram  20 mg Oral Daily   gabapentin  800 mg Oral BID   And   gabapentin  1,600 mg Oral QHS   insulin aspart  0-15 Units Subcutaneous TID WC   lidocaine 1 %  5 mL Subcutaneous Once   losartan  25 mg Oral Daily  pantoprazole  40 mg Oral Daily   polyethylene glycol  17 g Oral Daily   rosuvastatin  20 mg Oral Daily   valACYclovir  1,000 mg Oral TID   Continuous Infusions: PRN Meds:.acetaminophen, fluticasone, haloperidol lactate, HYDROmorphone (DILAUDID) injection, lidocaine, ondansetron **OR** ondansetron (ZOFRAN) IV    Objective: Weight change:  No intake or output data in the 24 hours ending 07/12/23 1048 Blood pressure (!) 171/78, pulse 69, temperature 98.9 F (37.2 C), temperature source Oral, resp. rate 20, height 5\' 5"  (1.651 m), weight 66.3 kg, SpO2 98%. Temp:  [98.2 F (36.8 C)-98.9 F (37.2 C)] 98.9 F (37.2 C) (11/11 0835) Pulse Rate:  [69-74] 69 (11/11 0835) Resp:  [16-20] 20 (11/11 0835) BP: (127-173)/(68-78) 171/78 (11/11 0835) SpO2:  [96 %-98 %] 98 % (11/11 0835)  Physical Exam: Physical Exam Constitutional:      General: She is not in acute distress.    Appearance: She is well-developed. She is not diaphoretic.  HENT:     Head: Normocephalic and atraumatic.     Right Ear: External ear normal.     Left  Ear: External ear normal.     Mouth/Throat:     Pharynx: No oropharyngeal exudate.  Eyes:     General: No scleral icterus.    Conjunctiva/sclera: Conjunctivae normal.     Pupils: Pupils are equal, round, and reactive to light.  Cardiovascular:     Rate and Rhythm: Normal rate and regular rhythm.  Pulmonary:     Effort: Pulmonary effort is normal. No respiratory distress.     Breath sounds: No wheezing.  Abdominal:     General: Bowel sounds are normal. There is no distension.     Palpations: Abdomen is soft.     Tenderness: There is no abdominal tenderness. There is no rebound.  Musculoskeletal:        General: No tenderness. Normal range of motion.  Lymphadenopathy:     Cervical: No cervical adenopathy.  Skin:    General: Skin is warm and dry.     Coloration: Skin is not pale.     Findings: No erythema or rash.  Neurological:     Mental Status: She is alert and oriented to person, place, and time.     Motor: No abnormal muscle tone.     Coordination: Coordination normal.     Comments: Left upper arm, hand weakness  Psychiatric:        Mood and Affect: Mood normal.        Behavior: Behavior normal.        Thought Content: Thought content normal.        Judgment: Judgment normal.      CBC:    BMET Recent Labs    07/10/23 0422 07/12/23 0318  NA 142  --   K 3.6  --   CL 113*  --   CO2 21*  --   GLUCOSE 110*  --   BUN 17  --   CREATININE 0.98 0.88  CALCIUM 8.8*  --      Liver Panel  No results for input(s): "PROT", "ALBUMIN", "AST", "ALT", "ALKPHOS", "BILITOT", "BILIDIR", "IBILI" in the last 72 hours.     Sedimentation Rate No results for input(s): "ESRSEDRATE" in the last 72 hours. C-Reactive Protein No results for input(s): "CRP" in the last 72 hours.  Micro Results: Recent Results (from the past 720 hour(s))  Culture, blood (Routine X 2) w Reflex to ID Panel     Status: None  Collection Time: 07/06/23 11:36 AM   Specimen: BLOOD  Result Value  Ref Range Status   Specimen Description BLOOD BLOOD LEFT HAND  Final   Special Requests   Final    BOTTLES DRAWN AEROBIC AND ANAEROBIC Blood Culture adequate volume   Culture   Final    NO GROWTH 5 DAYS Performed at [facility], [address] [place], [place] [address]    Report Status 07/11/2023 FINAL  Final  Culture, blood (Routine X 2) w Reflex to ID Panel     Status: None   Collection Time: 07/06/23 11:39 AM   Specimen: BLOOD  Result Value Ref Range Status   Specimen Description BLOOD BLOOD RIGHT HAND  Final   Special Requests   Final    BOTTLES DRAWN AEROBIC AND ANAEROBIC Blood Culture adequate volume   Culture   Final    NO GROWTH 5 DAYS Performed at [facility], [address] [place], [place] [address]    Report Status 07/11/2023 FINAL  Final  CSF culture w Gram Stain     Status: None   Collection Time: 07/06/23  3:43 PM   Specimen: Lumbar Puncture; Cerebrospinal Fluid  Result Value Ref Range Status   Specimen Description   Final    CSF Performed at [facility], [address], [place], [place] [address]    Special Requests   Final    NONE Performed at [facility], [address] [place], [place] [address]    Gram Stain   Final    NO ORGANISMS SEEN WBC SEEN RED BLOOD CELLS PRESENT Performed at [facility], [address] [place], [place] [address]    Culture   Final    NO GROWTH 3 DAYS Performed at [facility], [address], [place], [place] [address]    Report Status 07/10/2023 FINAL  Final  Culture, fungus without smear     Status: None (Preliminary result)   Collection Time: 07/06/23  3:43 PM   Specimen: Lumbar Puncture; Cerebrospinal Fluid  Result Value Ref Range Status   Specimen Description   Final    CSF Performed at [facility], [address], [place], [place] [address]    Special Requests   Final    NONE Performed at [facility], [address],  [place], [place] [address]    Culture   Final    NO FUNGUS ISOLATED AFTER 5 DAYS Performed at [facility], [address], [place], [place] [address]    Report Status PENDING  Incomplete  Anaerobic culture w Gram Stain     Status: None   Collection Time: 07/06/23  3:43 PM   Specimen: Lumbar Puncture; Cerebrospinal Fluid  Result Value Ref Range Status   Specimen Description   Final    CSF Performed at [facility], [address], [place], [place] [address]    Special Requests   Final    NONE Performed at [facility], [address] [place], [place] [address]    Gram Stain   Final    WBC PRESENT, PREDOMINANTLY MONONUCLEAR NO ORGANISMS SEEN CYTOSPIN SMEAR    Culture   Final    NO ANAEROBES ISOLATED Performed at [facility], [address], [place], [place] [address]    Report Status 07/11/2023 FINAL  Final  Varicella-zoster by PCR     Status: Abnormal   Collection Time: 07/06/23  3:43 PM   Specimen: Hand,  Left; Sterile Swab  Result Value Ref Range Status   Varicella-Zoster, PCR Positive (A) Negative Final    Comment: (NOTE) Varicella Zoster Virus DNA detected. This test was developed and its performance characteristics determined by [facility].  It has not been cleared or approved by the Food and Drug Administration.  The FDA has determined that such clearance or approval is not necessary. Performed At: [facility] [address] [place], [place] [id number] Jolene Schimke MD [id number]   VZV PCR, CSF     Status: None   Collection Time: 07/08/23 10:30 AM   Specimen: Cerebrospinal Fluid  Result Value Ref Range Status   VZV PCR, CSF Negative Negative Final    Comment: (NOTE) No Varicella Zoster Virus DNA detected. Performed At: [facility] [address] [place], [place] [id number] Jolene Schimke MD WN:[id number]     Studies/Results: No results found.    Assessment/Plan:  INTERVAL HISTORY: VZV PCR on CSF repeat  negative VZV on scabbed lesion +  Patient wants to go home   Principal Problem:   AMS (altered mental status) Active Problems:   Encephalitis   CSF pleocytosis   Myelitis (HCC)   Spinal stenosis in cervical region   Cervical myelopathy (HCC)   Weakness of left upper extremity   Varicella zoster meningitis    Theresa Ferguson is a 77 y.o. female with recent zoster eruption which she was being treated for with Valtrex.  She then developed worsening gait needing to use a walker at home and then worsening left upper extremity numbness and pain and sensation of inner ear fullness.  She came to the ER and then became encephalopathic although she points out that she was given Ativan which she has not tolerated well in the past and which has made her delirious.  She underwent LP that did show CSF pleocytosis though meningitis panel was negative including VZV VZV repeat separate PCR was also negative though VZV PCR was positive from her scabbed hand lesion.  She is currently on acyclovir course to complete 10-day course of therapy for possible myelitis  She does not herself feel that her symptoms have improved in terms of her medicine pain in her hands.  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  She is very anxious to go home and I do not see the point in putting a midline or PICC line to give her 4 more days of IV acyclovir.  She certainly has no evidence of encephalopathy at this point in time I would recommend switching her over to Valtrex 1 g 3 times daily to complete her therapy.  #2 CNS pathology/meningitis: I suspect this is a post VZV or para VZV process  -- Commended follow-up with PCP for pain control she may also potentially benefit from physical therapy rehab  #3 cephalopathy by her account this was due to the Ativan I have increased the severity of the Ativan hypersensitivity in her allergy profile.    I have personally  spent involved in face-to-face and non-face-to-face activities for this patient on the day of the visit. Professional time spent includes the following activities: Preparing to see the patient (review of tests), Obtaining and/or reviewing separately obtained history (admission/discharge record), Performing a medically appropriate examination and/or evaluation , Ordering medications/tests/procedures, referring and communicating with other health care professionals, Documenting clinical information in the EMR, Independently interpreting results (not separately reported), Communicating results to the patient/family/caregiver, Counseling and educating the patient/family/caregiver  and Care coordination (not separately reported).    I will sign off for now.  Please call with further questions.   LOS: 4 days   Acey Lav 07/12/2023, 10:48 AM

## 2023-07-12 NOTE — Progress Notes (Signed)
Occupational Therapy Treatment Patient Details Name: Theresa Ferguson MRN: [id number] DOB: 1945/11/27 Today's Date: 07/12/2023   History of present illness Theresa Ferguson is a 77 y.o. female with medical history significant of intermittent vertigo, HTN, HLD, right breast cancer status post lumpectomy and radioactive seed treatment, PVD on Plavix, IIDM, chronic peripheral neuropathy on gabapentin, brought in by family member for evaluation of worsening of mentations.   OT comments  Ms Kuyper was seen for OT treatment on this date. Upon arrival to room pt long sitting in bed, agreeable to tx. Pt requires SUPERVISION don B socks in sitting. CGA for ADL t/f - minor LOBs, corrects with external support on counter. Pt reports LUE burning/numbness, educated on nerve tendon glides and falls prevention strategies. Pt making good progress toward goals, will continue to follow POC. Discharge recommendation updated.        If plan is discharge home, recommend the following:  Assist for transportation;Supervision due to cognitive status;A little help with bathing/dressing/bathroom;A little help with walking and/or transfers;Assistance with cooking/housework   Equipment Recommendations  Other (comment) (RW)    Recommendations for Other Services      Precautions / Restrictions Precautions Precautions: Fall Restrictions Weight Bearing Restrictions: No       Mobility Bed Mobility Overal bed mobility: Modified Independent                  Transfers Overall transfer level: Needs assistance Equipment used: None Transfers: Sit to/from Stand Sit to Stand: Contact guard assist                 Balance Overall balance assessment: Needs assistance Sitting-balance support: Feet supported, No upper extremity supported Sitting balance-Leahy Scale: Normal     Standing balance support: During functional activity, No upper extremity supported Standing balance-Leahy Scale: Fair                              ADL either performed or assessed with clinical judgement   ADL Overall ADL's : Needs assistance/impaired                                       General ADL Comments: SUPERVISION don B socks in sitting. CGA for ADL t/f - minor LObs, corrects with external support on counter      Cognition Arousal: Alert Behavior During Therapy: Impulsive, WFL for tasks assessed/performed Overall Cognitive Status: Within Functional Limits for tasks assessed                                          Exercises Exercises: Other exercises Other Exercises Other Exercises: Educated on falls prevention and nerve tendon  glides       General Comments continues to have LUE sensation deficits    Pertinent Vitals/ Pain       Pain Assessment Pain Assessment: 0-10 Pain Score: 4  Pain Location: LUE Pain Descriptors / Indicators: Grimacing, Discomfort Pain Intervention(s): Limited activity within patient's tolerance, Repositioned   Frequency  Min 1X/week        Progress Toward Goals  OT Goals(current goals can now be found in the care plan section)  Progress towards OT goals: Progressing toward goals  Acute Rehab OT Goals Patient Stated Goal: to go home  OT Goal Formulation: With patient Time For Goal Achievement: 07/22/23 Potential to Achieve Goals: Good ADL Goals Pt Will Perform Lower Body Bathing: sit to/from stand;sitting/lateral leans;with contact guard assist Pt Will Perform Lower Body Dressing: with contact guard assist;sit to/from stand;sitting/lateral leans Pt Will Transfer to Toilet: with contact guard assist;ambulating;regular height toilet;grab bars Pt Will Perform Toileting - Clothing Manipulation and hygiene: with contact guard assist;sitting/lateral leans;sit to/from stand  Plan      Co-evaluation                 AM-PAC OT "6 Clicks" Daily Activity     Outcome Measure   Help from another person eating meals?:  None Help from another person taking care of personal grooming?: None Help from another person toileting, which includes using toliet, bedpan, or urinal?: A Little Help from another person bathing (including washing, rinsing, drying)?: A Little Help from another person to put on and taking off regular upper body clothing?: None Help from another person to put on and taking off regular lower body clothing?: A Little 6 Click Score: 21    End of Session Equipment Utilized During Treatment: Rolling walker (2 wheels);Gait belt  OT Visit Diagnosis: Unsteadiness on feet (R26.81);Other abnormalities of gait and mobility (R26.89);Muscle weakness (generalized) (M62.81)   Activity Tolerance Patient tolerated treatment well   Patient Left in bed;with call bell/phone within reach;with family/visitor present   Nurse Communication          Time: 1140-1153 OT Time Calculation (min): 13 min  Charges: OT General Charges $OT Visit: 1 Visit OT Treatments $Therapeutic Activity: 8-22 mins  Kathie Dike, M.S. OTR/L  07/12/23, 12:55 PM  ascom [phone]

## 2023-07-12 NOTE — Progress Notes (Signed)
Physical Therapy Treatment Patient Details Name: Theresa Ferguson MRN: [id number] DOB: 1946-06-09 Today's Date: 07/12/2023   History of Present Illness Theresa Ferguson is a 77 y.o. female with medical history significant of intermittent vertigo, HTN, HLD, right breast cancer status post lumpectomy and radioactive seed treatment, PVD on Plavix, IIDM, chronic peripheral neuropathy on gabapentin, brought in by family member for evaluation of worsening of mentations.    PT Comments  Pt A,Ox4, reporting LUE sensation deficits (Neurologist, MD Amada Jupiter notified), and very receptive to PT. Pt demonstrates increased stability with all mobility this session. Performs bed mobility, sock/shoe donning, and STS transfers independently with no LOB. Supervision for ambulating community distances with a RW 2/2 narrow BOS, but no postural sway noted and pt demonstrates increased ability to steer RW without veering this session. Pt would benefit from continued PT to address balance and strength deficits and to further increase functional independence.    If plan is discharge home, recommend the following: A little help with walking and/or transfers;A little help with bathing/dressing/bathroom   Can travel by private vehicle     Yes  Equipment Recommendations  None recommended by PT    Recommendations for Other Services       Precautions / Restrictions Precautions Precautions: Fall Restrictions Weight Bearing Restrictions: No     Mobility  Bed Mobility Overal bed mobility: Modified Independent Bed Mobility: Supine to Sit, Sit to Supine     Supine to sit: Modified independent (Device/Increase time) Sit to supine: Modified independent (Device/Increase time)        Transfers Overall transfer level: Modified independent Equipment used: None Transfers: Sit to/from Stand Sit to Stand: Modified independent (Device/Increase time)                Ambulation/Gait Ambulation/Gait assistance:  Supervision Gait Distance (Feet): 160 Feet Assistive device: Rolling walker (2 wheels) Gait Pattern/deviations: Narrow base of support       General Gait Details: pt maintains narrow BOS even with verbal cueing, but no overt LOB noted and increased stability with RW this session vs previous ones   Stairs             Wheelchair Mobility     Tilt Bed    Modified Rankin (Stroke Patients Only)       Balance Overall balance assessment: Needs assistance Sitting-balance support: Feet supported, No upper extremity supported Sitting balance-Leahy Scale: Normal Sitting balance - Comments: able to don socks and shoes without LOB sitting EOB   Standing balance support: Bilateral upper extremity supported, During functional activity Standing balance-Leahy Scale: Good Standing balance comment: increased stability and decreased postural sway this session with standing balance                            Cognition Arousal: Alert Behavior During Therapy: WFL for tasks assessed/performed Overall Cognitive Status: Within Functional Limits for tasks assessed                                          Exercises      General Comments General comments (skin integrity, edema, etc.): continues to have LUE sensation deficits, neurologist notified      Pertinent Vitals/Pain Pain Assessment Pain Assessment: No/denies pain    Home Living  Prior Function            PT Goals (current goals can now be found in the care plan section) Acute Rehab PT Goals Patient Stated Goal: to play with grandkids PT Goal Formulation: With patient Time For Goal Achievement: 07/21/23 Potential to Achieve Goals: Good Progress towards PT goals: Progressing toward goals    Frequency    Min 1X/week      PT Plan      Co-evaluation              AM-PAC PT "6 Clicks" Mobility   Outcome Measure  Help needed turning from your  back to your side while in a flat bed without using bedrails?: None Help needed moving from lying on your back to sitting on the side of a flat bed without using bedrails?: None Help needed moving to and from a bed to a chair (including a wheelchair)?: None Help needed standing up from a chair using your arms (e.g., wheelchair or bedside chair)?: None Help needed to walk in hospital room?: A Little Help needed climbing 3-5 steps with a railing? : A Little 6 Click Score: 22    End of Session Equipment Utilized During Treatment: Gait belt Activity Tolerance: Patient tolerated treatment well Patient left: in bed;with call bell/phone within reach;with bed alarm set Nurse Communication: Mobility status (MD notified on sensation deficits) PT Visit Diagnosis: Unsteadiness on feet (R26.81);Other abnormalities of gait and mobility (R26.89);Difficulty in walking, not elsewhere classified (R26.2)     Time: 1610-9604 PT Time Calculation (min) (ACUTE ONLY): 15 min  Charges:    $Gait Training: 8-22 mins PT General Charges $$ ACUTE PT VISIT: 1 Visit                      Shauna Hugh, SPT 07/12/2023, 11:44 AM

## 2023-07-12 NOTE — Discharge Summary (Signed)
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 female with medical history significant of intermittent vertigo, HTN, HLD, right breast cancer status post lumpectomy and radioactive seed treatment, PVD on Plavix, IIDM, chronic peripheral neuropathy on gabapentin, brought in by family member for evaluation of worsening of mentations.  Patient improved with IV acyclovir was seen by infectious disease has been cleared for discharge today to complete antiviral therapy and to 07/15/2023.  Patient was also seen by neurosurgeon on account of severe cervical spine stenosis and at this point no surgical intervention recommended but rather to follow-up as an outpatient.  Patient was also seen by neurologist underwent lumbar puncture that showed pleocytosis.  Consultants: Infectious disease Procedures performed: None Disposition: Home Diet recommendation:  Cardiac diet DISCHARGE MEDICATION: Allergies as of 07/12/2023       Reactions   Ativan [lorazepam] Other (See Comments)   confusion   Codeine    Latex    Morphine And Codeine    Nickel    Penicillins    Atorvastatin Other (See Comments)   Cerivastatin Other (See Comments)   Fluvastatin Other (See Comments)        Medication List     STOP taking these medications    Magnesium 400 MG Tabs   meclizine  12.5 MG tablet Commonly known as: ANTIVERT   metaxalone 800 MG tablet Commonly known as: SKELAXIN       TAKE these medications    amLODipine 5 MG tablet Commonly known as: NORVASC Take 1 tablet (5 mg total) by mouth daily. Start taking on: July 13, 2023   clopidogrel 75 MG tablet Commonly known as: Plavix Take 1 tablet (75 mg total) by mouth daily.   cyanocobalamin 1000 MCG tablet Commonly known as: VITAMIN B12 Take 1,000 mcg by mouth daily.   escitalopram 20 MG tablet Commonly known as: LEXAPRO TAKE 1 TABLET BY MOUTH ONCE DAILY   Farxiga 10 MG Tabs tablet Generic drug: dapagliflozin propanediol Take 10 mg by mouth daily.   ferrous sulfate 325 (65 FE) MG tablet Take 325 mg by mouth daily with breakfast.   Flonase Allergy Relief 50 MCG/ACT nasal spray Generic drug: fluticasone Place 2 sprays into both nostrils as needed.   gabapentin 800 MG tablet Commonly known as: NEURONTIN Take one by mouth at morning . Lunch and 2 at night.   lidocaine 5 % ointment Commonly known as: XYLOCAINE Apply 1 Application topically 2 (two) times daily as needed.   losartan 25 MG tablet Commonly known as: COZAAR Take 25 mg by mouth daily.   metFORMIN 500 MG tablet Commonly known as: GLUCOPHAGE Take 1,000 mg by mouth 2 (two) times daily. At bedtime   metFORMIN 500 MG 24 hr tablet Commonly known as: GLUCOPHAGE-XR Take 1,000 mg by mouth 2 (two) times daily.   pantoprazole 40 MG tablet Commonly known as: PROTONIX Take 40 mg by mouth daily.   polyethylene glycol 17 g packet  Commonly known as: MIRALAX / GLYCOLAX Take 17 g by mouth daily. Start taking on: July 13, 2023   rosuvastatin 20 MG tablet Commonly known as: CRESTOR Take 1 tablet by mouth daily. What changed: Another medication with the same name was removed. Continue taking this medication, and follow the directions you see here.   traMADol 50 MG tablet Commonly known as: ULTRAM Take 50 mg by mouth.  1  tablet as needed Orally up to twice a day for shingles pain   valACYclovir 1000 MG tablet Commonly known as: VALTREX Take 1 tablet (1,000 mg total) by mouth 3 (three) times daily for 5 days.        Follow-up Information     Ollen Bowl, MD. Go on 07/16/2023.   Specialty: Internal Medicine Why: @12 :30pm Contact information: [address] [place] [address] [phone]         Lovenia Kim, MD. Schedule an appointment as soon as possible for a visit.   Specialty: Neurosurgery Contact information: [address] [place] [address] [phone]                Discharge Exam: Ceasar Mons Weights   07/06/23 0331  Weight: 66.3 kg   Eyes: PERRL, lids and conjunctivae normal ENMT: Mucous membranes are moist. Posterior pharynx clear of any exudate or lesions.Normal dentition.  Neck: normal, supple, no masses, no thyromegaly Respiratory: clear to auscultation bilaterally, no wheezing, no crackles. Normal respiratory effort. No accessory muscle use.  Cardiovascular: Regular rate and rhythm, no murmurs / rubs / gallops. No extremity edema. 2+ pedal pulses. No carotid bruits.  Abdomen: no tenderness, no masses palpated. No hepatosplenomegaly. Bowel sounds positive.  Musculoskeletal: Numbness noted to the left upper extremity as well as 4/5 weakness Skin: Crusted small lesion on the left palm Neurologic: Fully alert and awake  Condition at discharge: good  The results of significant diagnostics from this hospitalization (including imaging, microbiology, ancillary and laboratory) are listed below for reference.   Imaging Studies: MR CERVICAL SPINE W WO CONTRAST  Result Date: 07/08/2023 CLINICAL DATA:  Provided history: Myelopathy, acute, cervical spine. Concern for shingles related radiculopathy. Check for nerve root enhancement. EXAM: MRI CERVICAL SPINE WITHOUT AND WITH CONTRAST TECHNIQUE: Multiplanar and multiecho pulse sequences of  the cervical spine, to include the craniocervical junction and cervicothoracic junction, were obtained without and with intravenous contrast. CONTRAST:  6mL GADAVIST GADOBUTROL 1 MMOL/ML IV SOLN COMPARISON:  Report from cervical spine radiographs 06/17/2023 (images unavailable). Ultrasound of the head/neck soft tissues 07/09/2022. FINDINGS: Alignment: Slight C5-C6 grade 1 retrolisthesis. Slight grade 1 anterolisthesis at C7-T1. 2 mm grade 1 anterolisthesis at T1-T2 and T2-T3. Vertebrae: Cervical vertebral body height is maintained. Minimal degenerative endplate edema at R4-W5 and C6-C7. No focal worrisome marrow lesion. Cord: Flattening of the spinal cord at C4-C5, C5-C6 and C6-C7 as described below. Subtle T2 hyperintense signal abnormality is questioned within the left aspect of the spinal cord at the C4-C5 and C5-C6 levels (for instance as seen on series 5, image 9). Additionally, there is a suspected small focus of myelomalacia within the left aspect of the spinal cord at the C5-C6 level (series 8, image 18). Posterior Fossa, vertebral arteries, paraspinal tissues: No abnormality identified within included portions of the posterior fossa. Flow voids preserved within the imaged cervical vertebral arteries. No paraspinal mass or collection. Disc levels: Multilevel disc degeneration, greatest at C4-C5, C5-C6 and C6-C7 (moderate-to-advanced at these levels). C2-C3: No significant disc herniation or  spinal canal stenosis. Facet hypertrophy and arthrosis (greater on the left). Mild left neural foraminal narrowing C3-C4: Disc bulge with minimal bilateral uncovertebral hypertrophy. Facet hypertrophy and arthrosis. Ligamentum flavum thickening. Mild spinal canal stenosis. Mild relative bilateral neural foraminal narrowing. C4-C5: Posterior disc osteophyte complex with bilateral disc osteophyte ridge/uncinate hypertrophy. Facet hypertrophy. Ligamentum flavum thickening. Severe spinal canal stenosis with spinal cord  flattening. Subtle T2 hyperintense signal abnormality is questioned within the left aspect of the spinal cord (for instance as seen on series 5, image 9). Severe bilateral neural foraminal narrowing. C5-C6: Slight grade 1 retrolisthesis. Disc bulge with bilateral disc osteophyte ridge/uncinate hypertrophy. Facet hypertrophy. Ligamentum flavum thickening. Severe spinal canal stenosis with spinal cord flattening. Subtle T2 hyperintense signal abnormality is questioned within the left aspect of the spinal cord (for instance as seen on series 5, image 9). Additionally, there is a suspected small focus of myelomalacia within the left aspect of the spinal cord at this level (series 8, image 18). Severe bilateral neural foraminal narrowing. C6-C7: Small central disc protrusion. Uncovertebral hypertrophy (greater on the right). Mild facet arthrosis. Ligamentum flavum thickening. Mild-to-moderate spinal canal stenosis (with mild flattening of the ventral spinal cord). Bilateral neural foraminal narrowing (moderate/severe right, mild left). C7-T1: Slight grade 1 anterolisthesis. Moderate facet arthrosis. No significant spinal canal or foraminal stenosis. 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 Impressions #3, #4, #5 and #6 will be called to the ordering clinician or representative by the Radiologist Assistant, and communication documented in the PACS or [employer]. IMPRESSION: 1. No pathologic nerve root enhancement is identified. 2. Cervical spondylosis as outlined. 3. At C4-C5, there is moderate-to-advanced disc degeneration with multifactorial severe spinal canal stenosis (and spinal cord flattening). Severe bilateral neural foraminal narrowing. 4. At C5-C6, there is moderate-to-advanced disc degeneration. Multifactorial severe spinal canal stenosis (with spinal cord flattening). Severe bilateral neural foraminal narrowing. 5. Subtle T2 hyperintense signal abnormality is questioned within  the left aspect of the spinal cord at the C4-C5 and C5-C6 levels, suspicious for mild spondylotic cord edema. 6. Suspected superimposed small focus of myelomalacia within the left aspect of the spinal cord at C5-C6. 7. At C6-C7, there is moderate-to-advanced disc degeneration. A central disc protrusion contributes to multifactorial mild-to-moderate spinal canal stenosis (with mild flattening of the ventral spinal cord). Bilateral neural foraminal narrowing (moderate/severe right, mild left). 8. No more than mild spinal canal narrowing, and no significant foraminal stenosis, at the remaining cervical levels. Electronically Signed   By: Jackey Loge D.O.   On: 07/08/2023 18:17   EEG adult  Result Date: 07/07/2023 Rejeana Brock, MD     07/07/2023  3:35 PM History: 77 year old with encephalopathy Sedation: None Patient State: Awake and drowsy Technique: This EEG was acquired with electrodes placed according to the International 10-20 electrode system (including Fp1, Fp2, F3, F4, C3, C4, P3, P4, O1, O2, T3, T4, T5, T6, A1, A2, Fz, Cz, Pz). The following electrodes were missing or displaced: none. Background: The background is fairly disorganized and consists predominantly of generalized irregular delta and theta range activities.  There is a very poorly sustained posterior dominant rhythm of 9 Hz that is seen at times.  There is no epileptiform discharge seen. Photic stimulation: Physiologic driving is now performed EEG Abnormalities: 1) generalized irregular slow activity Clinical Interpretation: This EEG is consistent with a generalized non-specific cerebral dysfunction(encephalopathy). There was no seizure or seizure predisposition recorded on this study. Please note that lack of epileptiform activity on EEG does not preclude the  possibility of epilepsy. Ritta Slot, MD [facility] [phone] If 7pm- 7am, please page neurology on call as listed in AMION.  MR BRAIN WO  CONTRAST  Result Date: 07/06/2023 CLINICAL DATA:  Altered mental status, weakness EXAM: MRI HEAD WITHOUT CONTRAST TECHNIQUE: Multiplanar, multiecho pulse sequences of the brain and surrounding structures were obtained without intravenous contrast. COMPARISON:  06/25/2023 MRI head, correlation is also made with 07/06/2023 CT head FINDINGS: Evaluation is somewhat limited by motion artifact. Brain: No restricted diffusion to suggest acute or subacute infarct. No acute hemorrhage, mass, mass effect, or midline shift. No hydrocephalus or extra-axial collection. Pituitary and craniocervical junction within normal limits. No hemosiderin deposition to suggest remote hemorrhage. Confluent and scattered T2 hyperintense signal in the periventricular white matter, likely the sequela of moderate chronic small vessel ischemic disease. Vascular: Normal arterial flow voids. Skull and upper cervical spine: Normal marrow signal. Sinuses/Orbits: Clear paranasal sinuses. No acute finding in the orbits. Status post bilateral lens replacements. Other: The mastoid air cells are well aerated. IMPRESSION: No acute intracranial process. No evidence of acute or subacute infarct. Electronically Signed   By: Wiliam Ke M.D.   On: 07/06/2023 19:05   DG FL GUIDED LUMBAR PUNCTURE  Result Date: 07/06/2023 CLINICAL DATA:  77 year old female. History of altered mental status. Team is requesting lumbar puncture for further evaluation. EXAM: LUMBAR PUNCTURE UNDER FLUOROSCOPY PROCEDURE: An appropriate skin entry site was determined fluoroscopically. Operator donned sterile gloves and mask. Skin site was marked, then prepped with Betadine, draped in usual sterile fashion, and infiltrated locally with 1% lidocaine. A 22-gauge spinal needle advanced into the thecal sac at L5-S1 using an interlaminar approach. 12 ml CSF were collected and divided among 4 sterile vials for the requested laboratory studies. The needle was then removed. The patient  tolerated the procedure well and there were no complications. FLUOROSCOPY: Radiation Exposure Index (as provided by the fluoroscopic device): 22.1 mGy Kerma IMPRESSION: Technically successful lumbar puncture under fluoroscopy. This exam was performed by Anders Grant NP and was directly supervised and interpreted by Dr. Olive Bass Electronically Signed   By: Olive Bass M.D.   On: 07/06/2023 16:13   CT HEAD WO CONTRAST ( )  Result Date: 07/06/2023 CLINICAL DATA:  77 year old female with combative, altered mental status. History of hypertension. EXAM: CT HEAD WITHOUT CONTRAST TECHNIQUE: Contiguous axial images were obtained from the base of the skull through the vertex without intravenous contrast. RADIATION DOSE REDUCTION: This exam was performed according to the departmental dose-optimization program which includes automated exposure control, adjustment of the mA and/or kV according to patient size and/or use of iterative reconstruction technique. COMPARISON:  Temporal bone CT 12/19/2019.  Brain MRI 06/24/2023. FINDINGS: Brain: Initial motion degraded sequence series 4 from 0646 hours had to be repeated. Diagnostic images obtained at 0752 hours. Stable cerebral volume from the MRI last month. No midline shift, mass effect, or evidence of intracranial mass lesion. No ventriculomegaly. Patchy and confluent bilateral cerebral white matter hypodensity appears stable from the MRI findings. Stable deep gray matter heterogeneity. No acute or chronic cortically based infarct identified. No acute intracranial hemorrhage identified. Vascular: Extensive Calcified atherosclerosis at the skull base. No suspicious intracranial vascular hyperdensity. Skull: No acute osseous abnormality identified. Sinuses/Orbits: Visualized paranasal sinuses and mastoids are stable and well aerated. Other: No acute orbit or scalp soft tissue finding. IMPRESSION: 1. No acute intracranial abnormality. 2. Stable non contrast CT  appearance of small vessel disease. Electronically Signed   By: Althea Grimmer.D.  On: 07/06/2023 08:28   MR BRAIN WO CONTRAST  Result Date: 06/24/2023 CLINICAL DATA:  Neuro deficit, acute, stroke suspected EXAM: MRI HEAD WITHOUT CONTRAST TECHNIQUE: Multiplanar, multiecho pulse sequences of the brain and surrounding structures were obtained without intravenous contrast. COMPARISON:  MRI head April 2, 21. FINDINGS: Brain: No acute infarction, hemorrhage, hydrocephalus, extra-axial collection or mass lesion. Moderate scattered T2/FLAIR hyperintensities in the white matter, nonspecific but compatible with chronic microvascular ischemic disease. Vascular: Major arterial flow voids are maintained skull base. Skull and upper cervical spine: Normal marrow signal. Sinuses/Orbits: Clear sinuses.  No acute orbital findings. Other: No mastoid effusions. IMPRESSION: 1. No evidence of acute intracranial abnormality. 2. Moderate chronic microvascular ischemic disease. Electronically Signed   By: Feliberto Harts M.D.   On: 06/24/2023 18:30    Microbiology: Results for orders placed or performed during the hospital encounter of 07/06/23  Culture, blood (Routine X 2) w Reflex to ID Panel     Status: None   Collection Time: 07/06/23 11:36 AM   Specimen: BLOOD  Result Value Ref Range Status   Specimen Description BLOOD BLOOD LEFT HAND  Final   Special Requests   Final    BOTTLES DRAWN AEROBIC AND ANAEROBIC Blood Culture adequate volume   Culture   Final    NO GROWTH 5 DAYS Performed at [facility], [address] [place], [place] [address]    Report Status 07/11/2023 FINAL  Final  Culture, blood (Routine X 2) w Reflex to ID Panel     Status: None   Collection Time: 07/06/23 11:39 AM   Specimen: BLOOD  Result Value Ref Range Status   Specimen Description BLOOD BLOOD RIGHT HAND  Final   Special Requests   Final    BOTTLES DRAWN AEROBIC AND ANAEROBIC Blood Culture adequate volume   Culture   Final     NO GROWTH 5 DAYS Performed at [facility], [address], [place], [place] [address]    Report Status 07/11/2023 FINAL  Final  CSF culture w Gram Stain     Status: None   Collection Time: 07/06/23  3:43 PM   Specimen: Lumbar Puncture; Cerebrospinal Fluid  Result Value Ref Range Status   Specimen Description   Final    CSF Performed at [facility], [address], [place], [place] [address]    Special Requests   Final    NONE Performed at [facility], [address] [place], [place] [address]    Gram Stain   Final    NO ORGANISMS SEEN WBC SEEN RED BLOOD CELLS PRESENT Performed at [facility], [address] [place], [place] [address]    Culture   Final    NO GROWTH 3 DAYS Performed at [facility], [address] [place], [place] [address]    Report Status 07/10/2023 FINAL  Final  Culture, fungus without smear     Status: None (Preliminary result)   Collection Time: 07/06/23  3:43 PM   Specimen: Lumbar Puncture; Cerebrospinal Fluid  Result Value Ref Range Status   Specimen Description   Final    CSF Performed at [facility], [address] [place], [place] [address]    Special Requests   Final    NONE Performed at [facility], [address], [place], [place] [address]    Culture   Final    NO FUNGUS ISOLATED AFTER 6 DAYS Performed at [facility], [address],  [place], [place] [address]    Report Status PENDING  Incomplete  Anaerobic culture w Gram Stain     Status: None   Collection Time: 07/06/23  3:43 PM   Specimen: Lumbar Puncture; Cerebrospinal Fluid  Result Value Ref Range Status   Specimen Description   Final    CSF Performed at [facility], [address], [place], [place] [address]    Special Requests   Final    NONE Performed at [facility], [address] [place], [place] [address]    Gram Stain   Final    WBC PRESENT, PREDOMINANTLY  MONONUCLEAR NO ORGANISMS SEEN CYTOSPIN SMEAR    Culture   Final    NO ANAEROBES ISOLATED Performed at [facility], [address] [place], [place] [address]    Report Status 07/11/2023 FINAL  Final  Varicella-zoster by PCR     Status: Abnormal   Collection Time: 07/06/23  3:43 PM   Specimen: Hand, Left; Sterile Swab  Result Value Ref Range Status   Varicella-Zoster, PCR Positive (A) Negative Final    Comment: (NOTE) Varicella Zoster Virus DNA detected. This test was developed and its performance characteristics determined by [facility].  It has not been cleared or approved by the Food and Drug Administration.  The FDA has determined that such clearance or approval is not necessary. Performed At: [facility] [address] [place], [place] [id number] Jolene Schimke MD XB:[id number]   VZV PCR, CSF     Status: None   Collection Time: 07/08/23 10:30 AM   Specimen: Cerebrospinal Fluid  Result Value Ref Range Status   VZV PCR, CSF Negative Negative Final    Comment: (NOTE) No Varicella Zoster Virus DNA detected. Performed At: [facility] [address] [place], [place] [id number] Jolene Schimke MD QI:[id number]     Labs: CBC: Recent Labs  Lab 07/08/23 0405 07/09/23 0310 07/10/23 0422 07/11/23 0328 07/12/23 0318  WBC 4.5 3.9* 2.9* 3.4* 3.5*  NEUTROABS 3.1 1.9 1.0* 1.5* 1.5*  HGB 8.3* 8.2* 7.8* 7.9* 8.2*  HCT 23.8* 24.8* 22.8* 23.7* 24.1*  MCV 95.2 97.6 95.4 97.9 95.3  PLT 128* 132* 131* 138* 142*   Basic Metabolic Panel: Recent Labs  Lab 07/06/23 0421 07/06/23 0536 07/06/23 0536 07/07/23 0547 07/07/23 1910 07/08/23 0405 07/09/23 0310 07/10/23 0422 07/12/23 0318  NA  --  139  --  139  --  144 139 142  --   K  --  3.0*   < > 2.4* 3.5 3.9 3.6 3.6  --   CL  --  108  --  115*  --  122* 109 113*  --   CO2  --  16*  --  15*  --  17* 20* 21*  --   GLUCOSE  --  164*  --  138*  --  113* 109* 110*  --   BUN  --  19  --  20  --  21 19 17   --    CREATININE  --  0.87   < > 1.09*  --  1.07* 0.97 0.98 0.88  CALCIUM  --  9.3  --  7.4*  --  8.6* 8.5* 8.8*  --   MG 1.8  --   --   --   --   --   --   --   --    < > = values in this interval not displayed.   Liver Function Tests: Recent Labs  Lab 07/06/23  0536  AST 26  ALT 13  ALKPHOS 31*  BILITOT 1.2*  PROT 6.7  ALBUMIN 3.9   CBG: Recent Labs  Lab 07/11/23 0815 07/11/23 1113 07/11/23 1637 07/11/23 2004 07/12/23 0843  GLUCAP 106* 192* 153* 117* 119*    Discharge time spent:  35 minutes.  Signed: Loyce Dys, MD [facility] 07/12/2023

## 2023-07-12 NOTE — Care Management Important Message (Signed)
Important Message  Patient Details  Name: Theresa Ferguson MRN: [id number] Date of Birth: 21-May-1946   Important Message Given:  Yes - Medicare IM  Patient is in an isolation room so I reviewed her Important Message from Medicare by phone [phone] and she is in agreement with her discharge today. I wished her a speedy recovery and thanked her for her time. Olegario Messier A Desirre Eickhoff 07/12/2023, 11:07 AM

## 2023-07-16 ENCOUNTER — Inpatient Hospital Stay
Admission: RE | Admit: 2023-07-16 | Discharge: 2023-07-16 | Disposition: A | Discharge status: Home / Self Care | Payer: Self-pay | Source: Ambulatory Visit | Attending: Neurosurgery | Admitting: Neurosurgery

## 2023-07-16 ENCOUNTER — Other Ambulatory Visit: Payer: Self-pay

## 2023-07-16 DIAGNOSIS — E876 Hypokalemia: Secondary | ICD-10-CM | POA: Diagnosis not present

## 2023-07-16 DIAGNOSIS — I739 Peripheral vascular disease, unspecified: Secondary | ICD-10-CM | POA: Diagnosis not present

## 2023-07-16 DIAGNOSIS — Z049 Encounter for examination and observation for unspecified reason: Secondary | ICD-10-CM

## 2023-07-16 DIAGNOSIS — G9341 Metabolic encephalopathy: Secondary | ICD-10-CM | POA: Diagnosis not present

## 2023-07-16 DIAGNOSIS — E1149 Type 2 diabetes mellitus with other diabetic neurological complication: Secondary | ICD-10-CM | POA: Diagnosis not present

## 2023-07-16 DIAGNOSIS — E782 Mixed hyperlipidemia: Secondary | ICD-10-CM | POA: Diagnosis not present

## 2023-07-16 DIAGNOSIS — D61818 Other pancytopenia: Secondary | ICD-10-CM | POA: Diagnosis not present

## 2023-07-16 DIAGNOSIS — E1169 Type 2 diabetes mellitus with other specified complication: Secondary | ICD-10-CM | POA: Diagnosis not present

## 2023-07-16 DIAGNOSIS — F39 Unspecified mood [affective] disorder: Secondary | ICD-10-CM | POA: Diagnosis not present

## 2023-07-16 DIAGNOSIS — E1121 Type 2 diabetes mellitus with diabetic nephropathy: Secondary | ICD-10-CM | POA: Diagnosis not present

## 2023-07-16 DIAGNOSIS — B020 Zoster encephalitis: Secondary | ICD-10-CM | POA: Diagnosis not present

## 2023-07-16 DIAGNOSIS — R42 Dizziness and giddiness: Secondary | ICD-10-CM | POA: Diagnosis not present

## 2023-07-16 DIAGNOSIS — I129 Hypertensive chronic kidney disease with stage 1 through stage 4 chronic kidney disease, or unspecified chronic kidney disease: Secondary | ICD-10-CM | POA: Diagnosis not present

## 2023-07-16 DIAGNOSIS — M4802 Spinal stenosis, cervical region: Secondary | ICD-10-CM | POA: Diagnosis not present

## 2023-07-16 LAB — MISC LABCORP TEST (SEND OUT): Labcorp test code: 3006202

## 2023-07-19 ENCOUNTER — Other Ambulatory Visit: Payer: Self-pay | Admitting: Family Medicine

## 2023-07-21 ENCOUNTER — Encounter: Payer: Self-pay | Admitting: Neurosurgery

## 2023-07-21 ENCOUNTER — Ambulatory Visit
Admission: RE | Admit: 2023-07-21 | Discharge: 2023-07-21 | Disposition: A | Discharge status: Home / Self Care | Payer: Medicare Other | Source: Ambulatory Visit | Attending: Neurosurgery | Admitting: Neurosurgery

## 2023-07-21 ENCOUNTER — Other Ambulatory Visit: Payer: Self-pay

## 2023-07-21 ENCOUNTER — Ambulatory Visit (INDEPENDENT_AMBULATORY_CARE_PROVIDER_SITE_OTHER): Payer: Medicare Other | Admitting: Neurosurgery

## 2023-07-21 VITALS — BP 122/68 | Ht 65.0 in | Wt 146.0 lb

## 2023-07-21 DIAGNOSIS — R29898 Other symptoms and signs involving the musculoskeletal system: Secondary | ICD-10-CM | POA: Diagnosis not present

## 2023-07-21 DIAGNOSIS — M4802 Spinal stenosis, cervical region: Secondary | ICD-10-CM

## 2023-07-21 DIAGNOSIS — M542 Cervicalgia: Secondary | ICD-10-CM | POA: Diagnosis not present

## 2023-07-21 DIAGNOSIS — M4712 Other spondylosis with myelopathy, cervical region: Secondary | ICD-10-CM | POA: Diagnosis not present

## 2023-07-21 DIAGNOSIS — G959 Disease of spinal cord, unspecified: Secondary | ICD-10-CM

## 2023-07-21 DIAGNOSIS — M47812 Spondylosis without myelopathy or radiculopathy, cervical region: Secondary | ICD-10-CM | POA: Diagnosis not present

## 2023-07-21 DIAGNOSIS — M4722 Other spondylosis with radiculopathy, cervical region: Secondary | ICD-10-CM

## 2023-07-21 DIAGNOSIS — M5412 Radiculopathy, cervical region: Secondary | ICD-10-CM | POA: Insufficient documentation

## 2023-07-23 NOTE — Progress Notes (Signed)
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 female who presents with the chief complaint of left upper extremity weakness.  She was previously seen by me in the emergency/inpatient setting where she was found to have severe left-sided myeloradiculopathy causing weakness in her left upper extremity.  At that point she was admitted for some episodes of confusion and recent shingles infection.  She continues to have severe pain going down her arm and continues to have significant weakness in her left sided elbow wrist and hand.  She is not having any improvement.  She continues to be quite debilitated by this.  She is here today for further discussion of possible surgical decompression.  The symptoms are causing a significant impact on the patient's life.   Review of Systems:  A 10 point review of systems is negative, except for the pertinent positives and negatives detailed in the HPI.  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   Personal history of radiation therapy    PONV (postoperative nausea and vomiting)    Radiculopathy of lumbosacral region 02/02/2013   RLS (restless legs syndrome) 02/05/2015   Seasonal allergies     Past Surgical History: Past Surgical History:  Procedure Laterality Date   BREAST LUMPECTOMY Right 08/07/2020   right lumpectomy w/ radiation   BREAST LUMPECTOMY WITH RADIOACTIVE SEED LOCALIZATION Right 08/07/2020   Procedure: RIGHT BREAST LUMPECTOMY WITH RADIOACTIVE SEED LOCALIZATION;  Surgeon: Emelia Loron, MD;  Location:  [facility];  Service: General;  Laterality: Right;   FINGER SURGERY Right 08/31/1996   [facility]   kidney stones  08/31/1978   KNEE SURGERY Bilateral    partial right-2008,left full replacement-2006-Dr [facility]   LAMINECTOMY     l4-5   LOWER EXTREMITY ANGIOGRAPHY Left 12/29/2022   Procedure: Lower Extremity Angiography;  Surgeon: Renford Dills, MD;  Location: [facility] INVASIVE CV LAB;  Service: Cardiovascular;  Laterality: Left;   LUMBAR DISC SURGERY      Allergies: Allergies as of 07/21/2023 - Review Complete 07/21/2023  Allergen Reaction Noted   Ativan [lorazepam] Other (See Comments) 07/11/2023   Codeine  02/02/2013   Latex  02/02/2013   Morphine and codeine  02/02/2013   Nickel  02/02/2013   Penicillins  02/02/2013   Atorvastatin Other (See Comments) 09/30/2021   Cerivastatin Other (See Comments) 09/30/2021   Fluvastatin Other (See Comments) 09/30/2021    Medications:  Current Outpatient Medications:    clopidogrel (PLAVIX) 75 MG tablet, Take 1 tablet (75 mg total) by mouth daily., Disp: 30 tablet, Rfl: 11   escitalopram (LEXAPRO) 20 MG tablet, TAKE 1 TABLET BY MOUTH ONCE DAILY, Disp: 90 tablet, Rfl: 0   FARXIGA 10 MG TABS tablet, Take 10 mg by mouth daily., Disp: , Rfl:    fluticasone (FLONASE ALLERGY RELIEF) 50 MCG/ACT nasal spray, Place 2 sprays into both nostrils as needed., Disp: , Rfl:    gabapentin (NEURONTIN) 800 MG tablet, Take one by mouth at morning . Lunch and 2 at night., Disp:  360 tablet, Rfl: 1   losartan (COZAAR) 25 MG tablet, Take 25 mg by mouth daily., Disp: , Rfl:    metFORMIN (GLUCOPHAGE) 500 MG tablet, Take 1,000 mg by mouth 2 (two) times daily. At bedtime, Disp: , Rfl:    pantoprazole (PROTONIX) 40 MG tablet, Take 40 mg by mouth daily., Disp: , Rfl:    polyethylene glycol (MIRALAX / GLYCOLAX) 17 g packet, Take 17 g by mouth daily., Disp: 14 each, Rfl: 0   rosuvastatin (CRESTOR) 20 MG tablet, Take 1 tablet by mouth daily., Disp: ,  Rfl:    vitamin B-12 (CYANOCOBALAMIN) 1000 MCG tablet, Take 1,000 mcg by mouth daily., Disp: , Rfl:    Social History: Social History   Tobacco Use   Smoking status: Former    Current packs/day: 0.00    Types: Cigarettes    Quit date: 1982    Years since quitting: 42.9   Smokeless tobacco: Never  Vaping Use   Vaping status: Never Used  Substance Use Topics   Alcohol use: Yes    Comment: socially   Drug use: No    Family Medical History: Family History  Problem Relation Age of Onset   Diabetes Son    Breast cancer Neg Hx     Physical Examination: Vitals:   07/21/23 1030  BP: 122/68     General: Patient is well developed, well nourished, calm, collected, and in no apparent distress.  NEUROLOGICAL:  General: In no acute distress.   Awake, alert, oriented to person, place, and time.  Pupils equal round and reactive to light.  Facial tone is symmetric.  Tongue protrusion is midline.  There is no pronator drift.  Strength: 4 out of 5 in left deltoid, 4-5 in left bicep and tricep.  1-2 out of 5 in handgrip, 2-3 out of 5 in wrist extension.  Contralateral side at least 4+ out of 5 throughout.  Decreased in the left upper extremity throughout the C5-6-7 dermatomes most predominantly.  Hyperreflexia noted with spreading reflexes.  Hoffman's reflex positive on the right with spreading of her reflexes.  Clonus is not present.  Toes are equivocal  Has some difficulty with tandem gait  Imaging: Narrative & Impression  CLINICAL DATA:  Provided history: Myelopathy, acute, cervical spine. Concern for shingles related radiculopathy. Check for nerve root enhancement.   EXAM: MRI CERVICAL SPINE WITHOUT AND WITH CONTRAST   TECHNIQUE: Multiplanar and multiecho pulse sequences of the cervical spine, to include the craniocervical junction and cervicothoracic junction, were obtained without and with intravenous contrast.   CONTRAST:  6mL GADAVIST GADOBUTROL 1 MMOL/ML IV  SOLN   COMPARISON:  Report from cervical spine radiographs 06/17/2023 (images unavailable). Ultrasound of the head/neck soft tissues 07/09/2022.   FINDINGS: Alignment: Slight C5-C6 grade 1 retrolisthesis. Slight grade 1 anterolisthesis at C7-T1. 2 mm grade 1 anterolisthesis at T1-T2 and T2-T3.   Vertebrae: Cervical vertebral body height is maintained. Minimal degenerative endplate edema at W0-J8 and C6-C7. No focal worrisome marrow lesion.   Cord: Flattening of the spinal cord at C4-C5, C5-C6 and C6-C7 as described below. Subtle T2 hyperintense signal abnormality is questioned within the left aspect of the spinal cord at the C4-C5 and C5-C6 levels (for instance as seen on series 5, image 9). Additionally, there is a suspected small focus of myelomalacia within the left aspect of the spinal cord at the C5-C6 level (series 8, image 18).   Posterior Fossa, vertebral arteries, paraspinal tissues: No abnormality identified within included portions of the  posterior fossa. Flow voids preserved within the imaged cervical vertebral arteries. No paraspinal mass or collection.   Disc levels:   Multilevel disc degeneration, greatest at C4-C5, C5-C6 and C6-C7 (moderate-to-advanced at these levels).   C2-C3: No significant disc herniation or spinal canal stenosis. Facet hypertrophy and arthrosis (greater on the left). Mild left neural foraminal narrowing   C3-C4: Disc bulge with minimal bilateral uncovertebral hypertrophy. Facet hypertrophy and arthrosis. Ligamentum flavum thickening. Mild spinal canal stenosis. Mild relative bilateral neural foraminal narrowing.   C4-C5: Posterior disc osteophyte complex with bilateral disc osteophyte ridge/uncinate hypertrophy. Facet hypertrophy. Ligamentum flavum thickening. Severe spinal canal stenosis with spinal cord flattening. Subtle T2 hyperintense signal abnormality is questioned within the left aspect of the spinal cord (for instance as  seen on series 5, image 9). Severe bilateral neural foraminal narrowing.   C5-C6: Slight grade 1 retrolisthesis. Disc bulge with bilateral disc osteophyte ridge/uncinate hypertrophy. Facet hypertrophy. Ligamentum flavum thickening. Severe spinal canal stenosis with spinal cord flattening. Subtle T2 hyperintense signal abnormality is questioned within the left aspect of the spinal cord (for instance as seen on series 5, image 9). Additionally, there is a suspected small focus of myelomalacia within the left aspect of the spinal cord at this level (series 8, image 18). Severe bilateral neural foraminal narrowing.   C6-C7: Small central disc protrusion. Uncovertebral hypertrophy (greater on the right). Mild facet arthrosis. Ligamentum flavum thickening. Mild-to-moderate spinal canal stenosis (with mild flattening of the ventral spinal cord). Bilateral neural foraminal narrowing (moderate/severe right, mild left).   C7-T1: Slight grade 1 anterolisthesis. Moderate facet arthrosis. No significant spinal canal or foraminal stenosis.   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   Impressions #3, #4, #5 and #6 will be called to the ordering clinician or representative by the Radiologist Assistant, and communication documented in the PACS or [employer].   IMPRESSION: 1. No pathologic nerve root enhancement is identified. 2. Cervical spondylosis as outlined. 3. At C4-C5, there is moderate-to-advanced disc degeneration with multifactorial severe spinal canal stenosis (and spinal cord flattening). Severe bilateral neural foraminal narrowing. 4. At C5-C6, there is moderate-to-advanced disc degeneration. Multifactorial severe spinal canal stenosis (with spinal cord flattening). Severe bilateral neural foraminal narrowing. 5. Subtle T2 hyperintense signal abnormality is questioned within the left aspect of the spinal cord at the C4-C5 and C5-C6 levels, suspicious  for mild spondylotic cord edema. 6. Suspected superimposed small focus of myelomalacia within the left aspect of the spinal cord at C5-C6. 7. At C6-C7, there is moderate-to-advanced disc degeneration. A central disc protrusion contributes to multifactorial mild-to-moderate spinal canal stenosis (with mild flattening of the ventral spinal cord). Bilateral neural foraminal narrowing (moderate/severe right, mild left). 8. No more than mild spinal canal narrowing, and no significant foraminal stenosis, at the remaining cervical levels.     Electronically Signed   By: Jackey Loge D.O.   On: 07/08/2023 18:17     Severe stenosis noted at C4 to C6.  Some T2 signal change noted between the C4 and C6 levels.   I have personally reviewed the images and agree with the above interpretation.  Labs:    Latest Ref Rng & Units 07/12/2023    3:18 AM 07/11/2023    3:28 AM 07/10/2023    4:22 AM  CBC  WBC 4.0 - 10.5 K/uL 3.5  3.4  2.9   Hemoglobin 12.0 - 15.0 g/dL 8.2  7.9  7.8   Hematocrit 36.0 - 46.0 % 24.1  23.7  22.8  Platelets 150 - 400 K/uL 142  138  131        Assessment and Plan: Ms. Dibiasio is a pleasant 77 y.o. female with severe see 4 to C6 stenosis secondary to disc osteophyte complexes, x-rays reviewed without pathologic movement but with evidence of disc space collapse and advanced cervical spondylosis.  On physical examination she has severe myeloradiculopathy with progressive weakness of her left upper extremity.  Left upper extremity in particular has almost complete loss of wrist extension and grip noted she also has some proximal weakness approximately 4 out of 5.  On physical exam she is hyperreflexic on the right side, all these signal together demonstrate a severe myeloradiculopathy with progressive worsening.  Given the level of disability that she has at this point with her severe weakness and loss of muscle strength, we will plan for a C4-C6 anterior cervical discectomy and  fusion we discussed this with both her and her husband, they would like to go forward with discussing this procedure and their plans.  We gave him the option to give [place] a call back for evaluation of this situation.  They will reach out and let us know whether or not they like to go forward with surgery.  We spent 30 minutes in direct patient evaluation, imaging review, counseling for procedure, discussion of risks, discussion of alternative cares.  Lovenia Kim, MD/MSCR Dept. of Neurosurgery

## 2023-07-27 LAB — CULTURE, FUNGUS WITHOUT SMEAR

## 2023-08-03 DIAGNOSIS — G959 Disease of spinal cord, unspecified: Secondary | ICD-10-CM | POA: Diagnosis not present

## 2023-08-03 DIAGNOSIS — M5412 Radiculopathy, cervical region: Secondary | ICD-10-CM | POA: Diagnosis not present

## 2023-08-03 DIAGNOSIS — M4802 Spinal stenosis, cervical region: Secondary | ICD-10-CM | POA: Diagnosis not present

## 2023-08-10 ENCOUNTER — Encounter: Payer: Self-pay | Admitting: Infectious Diseases

## 2023-08-10 ENCOUNTER — Ambulatory Visit: Payer: Medicare Other | Attending: Infectious Diseases | Admitting: Infectious Diseases

## 2023-08-10 VITALS — BP 144/85 | HR 80 | Temp 96.4°F | Ht 65.0 in | Wt 132.0 lb

## 2023-08-10 DIAGNOSIS — G0489 Other myelitis: Secondary | ICD-10-CM | POA: Insufficient documentation

## 2023-08-10 DIAGNOSIS — E1140 Type 2 diabetes mellitus with diabetic neuropathy, unspecified: Secondary | ICD-10-CM | POA: Diagnosis not present

## 2023-08-10 DIAGNOSIS — Z853 Personal history of malignant neoplasm of breast: Secondary | ICD-10-CM | POA: Insufficient documentation

## 2023-08-10 DIAGNOSIS — M50021 Cervical disc disorder at C4-C5 level with myelopathy: Secondary | ICD-10-CM | POA: Diagnosis not present

## 2023-08-10 DIAGNOSIS — E785 Hyperlipidemia, unspecified: Secondary | ICD-10-CM | POA: Diagnosis not present

## 2023-08-10 DIAGNOSIS — Z87891 Personal history of nicotine dependence: Secondary | ICD-10-CM | POA: Insufficient documentation

## 2023-08-10 DIAGNOSIS — Z79899 Other long term (current) drug therapy: Secondary | ICD-10-CM | POA: Diagnosis not present

## 2023-08-10 DIAGNOSIS — Z7984 Long term (current) use of oral hypoglycemic drugs: Secondary | ICD-10-CM | POA: Diagnosis not present

## 2023-08-10 DIAGNOSIS — M4802 Spinal stenosis, cervical region: Secondary | ICD-10-CM | POA: Insufficient documentation

## 2023-08-10 DIAGNOSIS — Z923 Personal history of irradiation: Secondary | ICD-10-CM | POA: Insufficient documentation

## 2023-08-10 DIAGNOSIS — I10 Essential (primary) hypertension: Secondary | ICD-10-CM | POA: Diagnosis not present

## 2023-08-10 DIAGNOSIS — B029 Zoster without complications: Secondary | ICD-10-CM | POA: Diagnosis not present

## 2023-08-10 NOTE — Progress Notes (Signed)
NAME: TRISHNA KARRICK  DOB: 1946/03/06  MRN: [id number]  Date/Time: 08/10/2023 9:04 AM  Subjective:   Theresa Ferguson is a 77 y.o. with a history of ca breast rt s/p lumpectomy, adjuvant radiation therapy and arimedex until 2022, anemia,? recently in hospital for left radicular pain and also confusion , between 11/5 -07/12/23  Patient had gone to urgent care on 06/13/2023 complaining of dizziness of 2 days duration bordering on vertigo.  She had taken meclizine with some benefit.  They diagnosed her with labyrinthitis versus acute suppurative otitis media of the right ear and gave a prednisone, azithromycin and meclizine.  She returned to the [place] [facility] urgent care on 06/17/2023 complaining of left-sided neck pain and spasms of the neck going down is her shoulder arm and hand.  An x-ray was done that was consistent with cervical radiculopathy.  She was prescribed Skelaxin and prednisone .  On 06/19/2023 she went to the ED complaining of a rash on the left arm which was painful and vesicular we diagnosed her with shingles and gave Valtrex 1 g twice a day for 7 days, and also gabapentin.  On 06/24/2023 she returned to the ED complaining of vertigo and inner ear issues.  She also complained of gait instability and dysmetria and a concern for central vertigo was raised and she got MRI of the brain which was negative for stroke.  She was discharged from the ED.  On 07/06/2023 patient return to the ED at around 3:30 AM having weakness altered mental status .She was taken for CT for which she received  2 mg of ativan which sedated her and when she woke up was more confused. She had LP which showed 92 wbc ( 88% L) . VZV was neg so was the  entire ME panel. She was getting acyclovir for possible VZV myelitis, encephalitis was questioned but MRI/EEG okay VZV PCR from the left palm scab was positive for VZV. l. She was discharge don 11/11 on PO valtrex . 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   Personal history of radiation therapy    PONV (postoperative nausea and vomiting)    Radiculopathy of lumbosacral region 02/02/2013   RLS (restless legs syndrome) 02/05/2015   Seasonal allergies     Past Surgical History:  Procedure Laterality Date   BREAST LUMPECTOMY Right 08/07/2020   right lumpectomy w/ radiation   BREAST LUMPECTOMY WITH RADIOACTIVE SEED LOCALIZATION Right 08/07/2020   Procedure: RIGHT BREAST LUMPECTOMY WITH RADIOACTIVE SEED LOCALIZATION;  Surgeon: Emelia Loron, MD;  Location: [facility];  Service: General;  Laterality: Right;   FINGER SURGERY Right 08/31/1996   [facility]   kidney stones  08/31/1978   KNEE SURGERY Bilateral    partial right-2008,left full replacement-2006-Dr [facility]   LAMINECTOMY     l4-5   LOWER EXTREMITY ANGIOGRAPHY Left  12/29/2022   Procedure: Lower Extremity Angiography;  Surgeon: Renford Dills, MD;  Location: [facility];  Service: Cardiovascular;  Laterality: Left;   LUMBAR DISC SURGERY      Social History   Socioeconomic History   Marital status: Married    Spouse name: Fayrene Fearing   Number of children: 2   Years of education: College   Highest education level: Not on file  Occupational History   Occupation: retired    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   Patient has two adult children.   Patient is retired.   Patient has a college education.   Patient is right-handed.   Patient drinks two cups of coffee daily.   Social Determinants of Health   Financial Resource Strain: Low Risk  (08/09/2023)   Received from [facility]   Overall Financial Resource Strain (CARDIA)    Difficulty of Paying Living Expenses: Not hard at all  Food Insecurity: No Food Insecurity (08/09/2023)   Received from [facility]   Hunger Vital Sign    Worried About Running Out of Food in the Last Year: Never true    Ran Out of Food in the Last Year: Never true  Transportation Needs: No Transportation Needs (08/09/2023)   Received from [facility] - Transportation    In the past 12 months, has lack of transportation kept you from medical appointments or from getting medications?: No    Lack of Transportation (Non-Medical): No  Physical Activity: Unknown (08/09/2023)   Received from [facility]   Exercise Vital Sign    Days of Exercise per Week: 0 days    Minutes of Exercise per Session: Patient declined  Stress: No Stress Concern Present (08/09/2023)   Received from [facility] - Occupational Stress Questionnaire    Feeling of Stress : Not at all  Social Connections: Socially Integrated (08/09/2023)   Received from [facility]   Social Connection and Isolation Panel [NHANES]     Frequency of Communication with Friends and Family: More than three times a week    Frequency of Social Gatherings with Friends and Family: Once a week    Attends Religious Services: More than 4 times per year    Active Member of [employer] or Organizations: Yes    Attends Engineer, structural: More than 4 times per year    Marital Status: Married  Catering manager Violence: Patient Unable To Answer (07/06/2023)   Humiliation, Afraid, Rape, and Kick questionnaire    Fear of Current or Ex-Partner: Patient unable to answer    Emotionally Abused: Patient unable to answer    Physically Abused: Patient unable to answer    Sexually Abused: Patient unable to answer    Family  History  Problem Relation Age of Onset   Diabetes Son    Breast cancer Neg Hx    Allergies  Allergen Reactions   Ativan [Lorazepam] Other (See Comments)    confusion   Codeine    Latex    Morphine And Codeine    Nickel    Penicillins    Atorvastatin Other (See Comments)   Cerivastatin Other (See Comments)   Fluvastatin Other (See Comments)   I? Current Outpatient Medications  Medication Sig Dispense Refill   clopidogrel (PLAVIX) 75 MG tablet Take 1 tablet (75 mg total) by mouth daily. 30 tablet 11   escitalopram (LEXAPRO) 20 MG tablet TAKE 1 TABLET BY MOUTH ONCE DAILY 90 tablet 0   FARXIGA 10 MG TABS tablet Take 10 mg by mouth daily.     fluticasone (FLONASE ALLERGY RELIEF) 50 MCG/ACT nasal spray Place 2 sprays into both nostrils as needed.     gabapentin (NEURONTIN) 800 MG tablet Take one by mouth at morning . Lunch and 2 at night. 360 tablet 1   losartan (COZAAR) 25 MG tablet Take 25 mg by mouth daily.     metFORMIN (GLUCOPHAGE) 500 MG tablet Take 1,000 mg by mouth 2 (two) times daily. At bedtime     pantoprazole (PROTONIX) 40 MG tablet Take 40 mg by mouth daily.     polyethylene glycol (MIRALAX / GLYCOLAX) 17 g packet Take 17 g by mouth daily. 14 each 0   rosuvastatin (CRESTOR) 20 MG tablet Take 1 tablet  by mouth daily.     vitamin B-12 (CYANOCOBALAMIN) 1000 MCG tablet Take 1,000 mcg by mouth daily.     No current facility-administered medications for this visit.     Abtx:  Anti-infectives (From admission, onward)    None       REVIEW OF SYSTEMS:  Const: negative fever, negative chills, negative weight loss Eyes: negative diplopia or visual changes, negative eye pain ENT: negative coryza, negative sore throat Resp: negative cough, hemoptysis, dyspnea Cards: negative for chest pain, palpitations, lower extremity edema GU: negative for frequency, dysuria and hematuria GI: Negative for abdominal pain, diarrhea, bleeding, constipation Skin: negative for rash and pruritus Heme: negative for easy bruising and gum/nose bleeding MS: negative for myalgias, arthralgias, back pain and muscle weakness Neurolo:nweakness left hand Psych: negative for feelings of anxiety, depression  Endocrine: negative for thyroid, diabetes Allergy/Immunology- negative for any medication or food allergies ?  Objective:  VITALS:  BP (!) 144/85   Pulse 80   Temp (!) 96.4 F (35.8 C) (Temporal)   Ht 5\' 5"  (1.651 m)   Wt 132 lb (59.9 kg)   BMI 21.97 kg/m   PHYSICAL EXAM:  General: Alert, cooperative, no distress, appears stated age.  Head: Normocephalic, without obvious abnormality, atraumatic. Eyes: Conjunctivae clear, anicteric sclerae. Pupils are equal ENT Nares normal. No drainage or sinus tenderness. Lips, mucosa, and tongue normal. No Thrush Neck: Supple, symmetrical, no adenopathy, thyroid: non tender no carotid bruit and no JVD. Back: No CVA tenderness. Lungs: Clear to auscultation bilaterally. No Wheezing or Rhonchi. No rales. Heart: Regular rate and rhythm, no murmur, rub or gallop. Abdomen: Soft, non-tender,not distended. Bowel sounds normal. No masses Extremities: atraumatic, no cyanosis. No edema. No clubbing Skin: No rashes or lesions. Or bruising Lymph: Cervical, supraclavicular  normal. Neurologic: wasting near hypothenar area 3-4/5 left hand   Impression/Recommendation  VZV left arm- resolved - completed Rx Pt can get VZV vaccine in 3-6 months   Csf pleocytosis myelitis questioned -  VZV neg in csf   Cervical stenosis with myelopathy C4-C6 and she is getting ACDF on 12/16- dont see any contraindication for surgery. Having PAT tomorrow  H/o Ca breast. S/p lumpectomy, radiation and had taken Arimedex   HTN on losartan   HLD on atorvastatin ? ?Discussed with patient and husband in detail Follow PRN

## 2023-08-10 NOTE — Patient Instructions (Addendum)
You are here for follow up of recent shingles and questionable myelitis and you completed treatment and it has resolved- You have cervical myelopathy and stenosis and going for ACDF on 12/16, you have preop assessment tomorrow You can take shingles vaccine in 6 months

## 2023-08-11 DIAGNOSIS — I129 Hypertensive chronic kidney disease with stage 1 through stage 4 chronic kidney disease, or unspecified chronic kidney disease: Secondary | ICD-10-CM | POA: Diagnosis not present

## 2023-08-11 DIAGNOSIS — I872 Venous insufficiency (chronic) (peripheral): Secondary | ICD-10-CM | POA: Diagnosis not present

## 2023-08-11 DIAGNOSIS — E119 Type 2 diabetes mellitus without complications: Secondary | ICD-10-CM | POA: Diagnosis not present

## 2023-08-11 DIAGNOSIS — C50919 Malignant neoplasm of unspecified site of unspecified female breast: Secondary | ICD-10-CM | POA: Diagnosis not present

## 2023-08-11 DIAGNOSIS — D508 Other iron deficiency anemias: Secondary | ICD-10-CM | POA: Diagnosis not present

## 2023-08-11 DIAGNOSIS — F419 Anxiety disorder, unspecified: Secondary | ICD-10-CM | POA: Diagnosis not present

## 2023-08-11 DIAGNOSIS — R200 Anesthesia of skin: Secondary | ICD-10-CM | POA: Diagnosis not present

## 2023-08-11 DIAGNOSIS — G9340 Encephalopathy, unspecified: Secondary | ICD-10-CM | POA: Diagnosis not present

## 2023-08-11 DIAGNOSIS — I10 Essential (primary) hypertension: Secondary | ICD-10-CM | POA: Diagnosis not present

## 2023-08-11 DIAGNOSIS — K219 Gastro-esophageal reflux disease without esophagitis: Secondary | ICD-10-CM | POA: Diagnosis not present

## 2023-08-11 DIAGNOSIS — G959 Disease of spinal cord, unspecified: Secondary | ICD-10-CM | POA: Diagnosis not present

## 2023-08-11 DIAGNOSIS — I739 Peripheral vascular disease, unspecified: Secondary | ICD-10-CM | POA: Diagnosis not present

## 2023-08-11 DIAGNOSIS — I8310 Varicose veins of unspecified lower extremity with inflammation: Secondary | ICD-10-CM | POA: Diagnosis not present

## 2023-08-11 DIAGNOSIS — M79603 Pain in arm, unspecified: Secondary | ICD-10-CM | POA: Diagnosis not present

## 2023-08-11 DIAGNOSIS — Z01818 Encounter for other preprocedural examination: Secondary | ICD-10-CM | POA: Diagnosis not present

## 2023-08-11 DIAGNOSIS — B028 Zoster with other complications: Secondary | ICD-10-CM | POA: Diagnosis not present

## 2023-08-16 DIAGNOSIS — E1151 Type 2 diabetes mellitus with diabetic peripheral angiopathy without gangrene: Secondary | ICD-10-CM | POA: Diagnosis not present

## 2023-08-16 DIAGNOSIS — E1122 Type 2 diabetes mellitus with diabetic chronic kidney disease: Secondary | ICD-10-CM | POA: Diagnosis not present

## 2023-08-16 DIAGNOSIS — D631 Anemia in chronic kidney disease: Secondary | ICD-10-CM | POA: Diagnosis not present

## 2023-08-16 DIAGNOSIS — C220 Liver cell carcinoma: Secondary | ICD-10-CM | POA: Diagnosis not present

## 2023-08-16 DIAGNOSIS — Z7722 Contact with and (suspected) exposure to environmental tobacco smoke (acute) (chronic): Secondary | ICD-10-CM | POA: Diagnosis not present

## 2023-08-16 DIAGNOSIS — I10 Essential (primary) hypertension: Secondary | ICD-10-CM | POA: Diagnosis not present

## 2023-08-16 DIAGNOSIS — G959 Disease of spinal cord, unspecified: Secondary | ICD-10-CM | POA: Diagnosis not present

## 2023-08-16 DIAGNOSIS — Z79899 Other long term (current) drug therapy: Secondary | ICD-10-CM | POA: Diagnosis not present

## 2023-08-16 DIAGNOSIS — M4712 Other spondylosis with myelopathy, cervical region: Secondary | ICD-10-CM | POA: Diagnosis not present

## 2023-08-16 DIAGNOSIS — M4802 Spinal stenosis, cervical region: Secondary | ICD-10-CM | POA: Diagnosis not present

## 2023-08-16 DIAGNOSIS — N189 Chronic kidney disease, unspecified: Secondary | ICD-10-CM | POA: Diagnosis not present

## 2023-08-16 DIAGNOSIS — G992 Myelopathy in diseases classified elsewhere: Secondary | ICD-10-CM | POA: Diagnosis not present

## 2023-08-16 DIAGNOSIS — I129 Hypertensive chronic kidney disease with stage 1 through stage 4 chronic kidney disease, or unspecified chronic kidney disease: Secondary | ICD-10-CM | POA: Diagnosis not present

## 2023-08-16 DIAGNOSIS — G2581 Restless legs syndrome: Secondary | ICD-10-CM | POA: Diagnosis not present

## 2023-08-16 DIAGNOSIS — Z7984 Long term (current) use of oral hypoglycemic drugs: Secondary | ICD-10-CM | POA: Diagnosis not present

## 2023-08-16 DIAGNOSIS — E1142 Type 2 diabetes mellitus with diabetic polyneuropathy: Secondary | ICD-10-CM | POA: Diagnosis not present

## 2023-08-16 DIAGNOSIS — Z853 Personal history of malignant neoplasm of breast: Secondary | ICD-10-CM | POA: Diagnosis not present

## 2023-08-16 DIAGNOSIS — E119 Type 2 diabetes mellitus without complications: Secondary | ICD-10-CM | POA: Diagnosis not present

## 2023-08-16 DIAGNOSIS — Z87891 Personal history of nicotine dependence: Secondary | ICD-10-CM | POA: Diagnosis not present

## 2023-08-16 DIAGNOSIS — Z981 Arthrodesis status: Secondary | ICD-10-CM | POA: Diagnosis not present

## 2023-08-16 DIAGNOSIS — K219 Gastro-esophageal reflux disease without esophagitis: Secondary | ICD-10-CM | POA: Diagnosis not present

## 2023-08-16 DIAGNOSIS — F419 Anxiety disorder, unspecified: Secondary | ICD-10-CM | POA: Diagnosis not present

## 2023-08-17 DIAGNOSIS — G992 Myelopathy in diseases classified elsewhere: Secondary | ICD-10-CM | POA: Diagnosis not present

## 2023-08-17 DIAGNOSIS — E119 Type 2 diabetes mellitus without complications: Secondary | ICD-10-CM | POA: Diagnosis not present

## 2023-08-17 DIAGNOSIS — N189 Chronic kidney disease, unspecified: Secondary | ICD-10-CM | POA: Diagnosis not present

## 2023-08-17 DIAGNOSIS — I129 Hypertensive chronic kidney disease with stage 1 through stage 4 chronic kidney disease, or unspecified chronic kidney disease: Secondary | ICD-10-CM | POA: Diagnosis not present

## 2023-08-17 DIAGNOSIS — M4802 Spinal stenosis, cervical region: Secondary | ICD-10-CM | POA: Diagnosis not present

## 2023-08-17 DIAGNOSIS — I10 Essential (primary) hypertension: Secondary | ICD-10-CM | POA: Diagnosis not present

## 2023-08-17 DIAGNOSIS — F419 Anxiety disorder, unspecified: Secondary | ICD-10-CM | POA: Diagnosis not present

## 2023-08-17 DIAGNOSIS — E1142 Type 2 diabetes mellitus with diabetic polyneuropathy: Secondary | ICD-10-CM | POA: Diagnosis not present

## 2023-08-17 DIAGNOSIS — E1122 Type 2 diabetes mellitus with diabetic chronic kidney disease: Secondary | ICD-10-CM | POA: Diagnosis not present

## 2023-10-05 ENCOUNTER — Other Ambulatory Visit: Payer: Self-pay | Admitting: General Surgery

## 2023-10-05 DIAGNOSIS — Z9889 Other specified postprocedural states: Secondary | ICD-10-CM

## 2023-10-18 ENCOUNTER — Other Ambulatory Visit: Payer: Self-pay | Admitting: Otolaryngology

## 2023-10-18 DIAGNOSIS — K118 Other diseases of salivary glands: Secondary | ICD-10-CM

## 2023-10-20 ENCOUNTER — Ambulatory Visit
Admission: RE | Admit: 2023-10-20 | Discharge: 2023-10-20 | Disposition: A | Discharge status: Home / Self Care | Payer: Medicare Other | Source: Ambulatory Visit | Attending: General Surgery | Admitting: General Surgery

## 2023-10-20 DIAGNOSIS — Z9889 Other specified postprocedural states: Secondary | ICD-10-CM

## 2023-10-26 ENCOUNTER — Other Ambulatory Visit (INDEPENDENT_AMBULATORY_CARE_PROVIDER_SITE_OTHER): Payer: Self-pay | Admitting: Nurse Practitioner

## 2023-10-26 DIAGNOSIS — Z9889 Other specified postprocedural states: Secondary | ICD-10-CM

## 2023-10-26 DIAGNOSIS — I83813 Varicose veins of bilateral lower extremities with pain: Secondary | ICD-10-CM

## 2023-10-27 ENCOUNTER — Encounter (INDEPENDENT_AMBULATORY_CARE_PROVIDER_SITE_OTHER): Payer: Self-pay | Admitting: Nurse Practitioner

## 2023-10-27 ENCOUNTER — Ambulatory Visit (INDEPENDENT_AMBULATORY_CARE_PROVIDER_SITE_OTHER): Payer: Medicare Other

## 2023-10-27 ENCOUNTER — Ambulatory Visit (INDEPENDENT_AMBULATORY_CARE_PROVIDER_SITE_OTHER): Payer: Medicare Other | Admitting: Nurse Practitioner

## 2023-10-27 VITALS — BP 165/84 | HR 73 | Resp 18 | Ht 65.5 in | Wt 136.0 lb

## 2023-10-27 DIAGNOSIS — Z9889 Other specified postprocedural states: Secondary | ICD-10-CM | POA: Diagnosis not present

## 2023-10-27 DIAGNOSIS — E782 Mixed hyperlipidemia: Secondary | ICD-10-CM | POA: Diagnosis not present

## 2023-10-27 DIAGNOSIS — I8310 Varicose veins of unspecified lower extremity with inflammation: Secondary | ICD-10-CM | POA: Diagnosis not present

## 2023-10-27 DIAGNOSIS — I739 Peripheral vascular disease, unspecified: Secondary | ICD-10-CM

## 2023-10-27 DIAGNOSIS — I83813 Varicose veins of bilateral lower extremities with pain: Secondary | ICD-10-CM

## 2023-10-27 DIAGNOSIS — E1169 Type 2 diabetes mellitus with other specified complication: Secondary | ICD-10-CM

## 2023-10-27 NOTE — Progress Notes (Signed)
 Subjective:    Patient ID: Theresa Ferguson, female    DOB: [birth year], 78 y.o.   MRN: [id number] Chief Complaint  Patient presents with   Follow-up    6 month follow up with ABI/LEA & BIL reflux    Theresa Ferguson is a 78 year old female who presents today for noninvasive studies related to her peripheral arterial disease as well as varicose veins.  Since her last visit she has had some significant health issues.  She developed shingles and was subsequently hospitalized for an infection in her spine.  In addition she has also had surgery on her neck as well.  Because of that she has been recovering and thus far is recovering fairly well but is still easily fatigued.  She denies any significant development of claudication or rest pain like symptoms.  Currently there is no open wounds or ulcerations or swelling of her lower extremities.  She does have varicosities more notable in her right lower extremity still but they are not bothersome for her as she has been very sedentary over the last several months during her recovery.  She remains on Plavix and a statin.  Today noninvasive studies show no evidence of DVT or superficial phlebitis bilaterally but there is evidence of superficial venous reflux in the right lower extremity with deep venous insufficiency in the left.  There is also a Baker's cyst noted in the left popliteal space.  In addition the patient's ABI is 0.73 on the right and 0.99 on the left.  This is decreased from previous studies which showed an ABI 1.19 on the right and 1.08 on the left.  Additional arterial duplex of the left lower extremity shows primarily biphasic waveforms throughout with a patent stent but there is stenosis noted at the mid to distal portion of the stent that is greater than 50%.    Review of Systems  Cardiovascular:  Negative for leg swelling.  Neurological:  Positive for numbness.  All other systems reviewed and are negative.      Objective:   Physical  Exam Vitals reviewed.  HENT:     Head: Normocephalic.  Cardiovascular:     Rate and Rhythm: Normal rate.     Pulses: Normal pulses.  Pulmonary:     Effort: Pulmonary effort is normal.  Skin:    General: Skin is warm and dry.  Neurological:     Mental Status: She is alert and oriented to person, place, and time.  Psychiatric:        Mood and Affect: Mood normal.        Behavior: Behavior normal.        Thought Content: Thought content normal.        Judgment: Judgment normal.     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   Seasonal allergies     Social History   Socioeconomic History   Marital status: Married    Spouse name: Fayrene Fearing   Number of children: 2   Years of education: College   Highest education level: Not on file  Occupational History   Occupation: retired    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   Patient has two adult children.   Patient is retired.   Patient has a college education.   Patient is right-handed.   Patient drinks two cups of coffee daily.   Social Drivers of Corporate investment banker Strain: Low Risk  (08/16/2023)   Received from [facility]   Overall Financial Resource Strain (CARDIA)    Difficulty of Paying Living Expenses: Not hard at all  Food Insecurity: No Food Insecurity (08/16/2023)   Received from [facility]   Hunger Vital Sign    Worried About Running Out of Food in the Last Year: Never true    Ran Out of Food in the Last Year: Never true  Transportation Needs: No Transportation Needs (08/16/2023)   Received from [facility] - Transportation    In the past 12 months, has lack of transportation kept you from medical appointments or from getting medications?: No    Lack of Transportation (Non-Medical): No  Physical Activity: Unknown (08/09/2023)   Received from [facility]   Exercise Vital Sign    Days of Exercise per Week: 0 days    Minutes of Exercise per Session: Patient declined  Stress: No Stress Concern Present (08/09/2023)   Received from [facility] - Occupational Stress Questionnaire    Feeling of Stress : Not at all  Social Connections: Socially Integrated (08/09/2023)   Received from [facility]   Social Connection and Isolation Panel [NHANES]    Frequency of Communication with Friends and Family: More than three times a week    Frequency of Social Gatherings with Friends and Family: Once a week    Attends Religious Services: More than 4 times per year    Active Member of [employer] or Organizations: Yes    Attends Engineer, structural: More than 4 times per year    Marital Status: Married  Catering manager Violence: Patient Unable To Answer (07/06/2023)    Humiliation, Afraid, Rape, and Kick questionnaire    Fear of Current or Ex-Partner: Patient unable to answer    Emotionally Abused: Patient unable to answer    Physically Abused: Patient unable to answer    Sexually Abused: Patient unable to answer    Past Surgical History:  Procedure Laterality Date   BREAST LUMPECTOMY Right 08/07/2020   right lumpectomy w/ radiation   BREAST LUMPECTOMY WITH RADIOACTIVE  SEED LOCALIZATION Right 08/07/2020   Procedure: RIGHT BREAST LUMPECTOMY WITH RADIOACTIVE SEED LOCALIZATION;  Surgeon: Emelia Loron, MD;  Location: [facility];  Service: General;  Laterality: Right;   FINGER SURGERY Right 08/31/1996   [facility]   kidney stones  08/31/1978   KNEE SURGERY Bilateral    partial right-2008,left full replacement-2006-Dr [facility]   LAMINECTOMY     l4-5   LOWER EXTREMITY ANGIOGRAPHY Left 12/29/2022   Procedure: Lower Extremity Angiography;  Surgeon: Renford Dills, MD;  Location: [facility];  Service: Cardiovascular;  Laterality: Left;   LUMBAR DISC SURGERY      Family History  Problem Relation Age of Onset   Diabetes Son    Breast cancer Neg Hx     Allergies  Allergen Reactions   Ativan [Lorazepam] Other (See Comments)    confusion   Codeine    Latex    Morphine And Codeine    Nickel    Penicillins    Atorvastatin Other (See Comments)   Cerivastatin Other (See Comments)   Fluvastatin Other (See Comments)       Latest Ref Rng & Units 07/12/2023    3:18 AM 07/11/2023    3:28 AM 07/10/2023    4:22 AM  CBC  WBC 4.0 - 10.5 K/uL 3.5  3.4  2.9   Hemoglobin 12.0 - 15.0 g/dL 8.2  7.9  7.8   Hematocrit 36.0 - 46.0 % 24.1  23.7  22.8   Platelets 150 - 400 K/uL 142  138  131       CMP     Component Value Date/Time   NA 142 07/10/2023 0422   K 3.6 07/10/2023 0422   K 4.0 09/20/2014 1223   CL 113 (H) 07/10/2023 0422   CO2 21 (L) 07/10/2023 0422   GLUCOSE 110 (H) 07/10/2023 0422   BUN 17 07/10/2023  0422   CREATININE 0.88 07/12/2023 0318   CALCIUM 8.8 (L) 07/10/2023 0422   PROT 6.7 07/06/2023 0536   ALBUMIN 3.9 07/06/2023 0536   AST 26 07/06/2023 0536   ALT 13 07/06/2023 0536   ALKPHOS 31 (L) 07/06/2023 0536   BILITOT 1.2 (H) 07/06/2023 0536   GFRNONAA >60 07/12/2023 0318     No results found.     Assessment & Plan:   1. Peripheral arterial disease with history of revascularization (HCC) (Primary) Currently at this time patient is not having any disabling claudication, rest pain or open wounds or ulcerations.  She does have a notable decrease in her ABIs on the right lower extremity.  However she has just had some significant health issues that she is still recovering from.  I feel that because she is currently not having any significant issues caused by her peripheral arterial disease, it would be better for the patient to proceed conservatively versus undergoing intervention at this time.  Will have her return in 6 months with noninvasive studies but she is advised that if she has any significant worsening of issues to contact her office and we will have her seen sooner.  2. Type 2 diabetes mellitus with other specified complication, unspecified whether long term insulin use (HCC) Continue hypoglycemic medications as already ordered, these medications have been reviewed and there are no changes at this time.  Hgb A1C to be monitored as already arranged by primary service  3. Mixed hyperlipidemia Continue statin as ordered and reviewed, no changes at this time   4. Varicose veins with inflammation Currently at this time  she has not been having any significant issues with her veins, especially given her recent recovery.  At this time because they are not giving her significant issues we discussed the use of conservative therapy.   Current Outpatient Medications on File Prior to Visit  Medication Sig Dispense Refill   clopidogrel (PLAVIX) 75 MG tablet Take 1 tablet (75 mg  total) by mouth daily. 30 tablet 11   escitalopram (LEXAPRO) 20 MG tablet TAKE 1 TABLET BY MOUTH ONCE DAILY 90 tablet 0   FARXIGA 10 MG TABS tablet Take 10 mg by mouth daily.     fluticasone (FLONASE ALLERGY RELIEF) 50 MCG/ACT nasal spray Place 2 sprays into both nostrils as needed.     gabapentin (NEURONTIN) 800 MG tablet Take one by mouth at morning . Lunch and 2 at night. 360 tablet 1   losartan (COZAAR) 25 MG tablet Take 25 mg by mouth daily.     metFORMIN (GLUCOPHAGE) 500 MG tablet Take 1,000 mg by mouth 2 (two) times daily. At bedtime     pantoprazole (PROTONIX) 40 MG tablet Take 40 mg by mouth daily.     rosuvastatin (CRESTOR) 20 MG tablet Take 1 tablet by mouth daily.     vitamin B-12 (CYANOCOBALAMIN) 1000 MCG tablet Take 1,000 mcg by mouth daily.     polyethylene glycol (MIRALAX / GLYCOLAX) 17 g packet Take 17 g by mouth daily. 14 each 0   No current facility-administered medications on file prior to visit.    There are no Patient Instructions on file for this visit. No follow-ups on file.   Georgiana Spinner, NP

## 2023-10-28 LAB — VAS US ABI WITH/WO TBI
Left ABI: 0.99
Right ABI: 0.73

## 2023-11-15 NOTE — Patient Instructions (Signed)

## 2023-11-15 NOTE — Progress Notes (Unsigned)
 PATIENT: Theresa Ferguson DOB: 16-Feb-1946  REASON FOR VISIT: follow up HISTORY FROM: patient  No chief complaint on file.   HISTORY OF PRESENT ILLNESS:  11/16/2023 ALL:  Eunice Blase returns for follow up for RLS and neuropathy. She was last seen 01/2022 and doing well on gabapentin 800mg  in am, at lunch and 1600mg  in evenings.   02/25/2022 ALL: Eunice Blase returns for follow up for RLS and neuropathy. She continues 800mg  in am, 800mg  at lunch and 1600mg  in evening. She feels gabapentin really helps with both restless leg sensation and burning stinging pain. She does have intermittent numbness. She does not think she continued alpha lipoic acid but has continued neuropathy cream as needed that helps. She is going well. She has started using a cane for stability. She is followed closely by her care team. She discontinued Arimidex. She could not tolerate side effects. She has routine mammograms and blood work. She continues to have labile BP. She is working closely with PCP. She continues lisinopril-HCTZ. She is staying hydrated.   02/25/2021 ALL: Eunice Blase returns for RLS follow up. She continues gabapentin 800mg  in am, 800mg  early afternoon and 1600mg  at bedtime. She added alpha lipoic acid supplements last year that have helped. She also uses neuropathy cream as needed. She reports that symptoms are stable. She did have a period of worsening about 3 months ago but feels that pain and discomfort has improved back to baseline. She sleeps well.   She had an abnormal mammogram in 05/2020. 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 No adjuvant chemo, she was started on Arimidex. She is tolerating fairly well but has noticed some new symptoms. She started having hot flashes within a week of starting the medication. Her husband has told her that she is a little more moody than normal. She describes an electrical feeling that shoots through  her body from time to time. This sensation is usually associated with feeling a little lightheaded and like she has tunnel vision. Symptoms usually occur when standing, sometimes after sitting but not always. Symptoms last about 30 seconds to a minute and then resolve. No syncopal events. No falls. She had last event yesterday after being outside working in her yard. She was sweating and clothes were soaked. When she went inside to take a break she felt a "shock go through" her body and felt a little lightheaded. After getting inside and resting, symptoms resolved. BP was 106/60. Usually 120's-140/70's. She admits that she does not drink very much water. She denies chest pain or shob. No palpitations. She has follow up with PCP next month and oncology in August.   02/26/2020 ALL: KAYSIE MICHELINI is a 78 y.o. female here today for follow up for RLS and neuropathy. She is doing ok. She continues to have numbness and tingling of bilateral feet. Symptoms wax and wane. She does still have restless leg symptoms as well. Some days are better than others. Neuropathy cream does seem to help. She has noticed tingling of both hands recently. Symptoms seem to have come about over the past 2-3 months. She is s/p internal fixation with titanium rod placement of right wrist in 10/2018 for fractured wrist. She has also had hand surgery for removal of nodules in the past. She has neck "tenderness" but no significant pain. She is followed by Dr Dion Saucier. She is very active. PCP follows labs twice a year. She continues iron, B12 and magnesium  supplements. Labs were last checked in 09/2019. Last A1C was 6.4.   [facility] in [place] is [facility].   HISTORY: (copied from my note on 02/22/2019)  TENIYA FILTER is a 78 y.o. female here today for follow up for RLS and neuropathy. She continues gabapentin 800mg  in the am and noon and 1600mg  at night. She is also using compounded cream as needed. During the summer months  she is able to exercise more and does not need the cream as often. She has a pool at her home that she uses for exercise. She is doing well today and without complaints.   History (copied from [facility] on 06/14/2018)   Ms. Toohey is a 78 year old female with a history of neuropathy and restless leg syndrome.  She returns today for follow-up.  She is taking 800 mg of gabapentin in the morning and at noon and 1600 mg at bedtime.  She reports that she has good days and bad days.  She states that she has about 4 bad days as of this.  She states sometimes been tingling sensation travels up the leg.  She denies any significant changes with her gait or balance.  She reports that she was unable to get the compounded cream from [facility] because of the cost  She states that her pharmacy does compounded cream and is asking that I sent her prescription there.  She returns today for follow-up.   HISTORY 06/14/17 Ms. Sheriff is a 78 year old female with a history of neuropathy and restless leg syndrome. She returns today for follow-up. She is currently taking gabapentin 800 mg in the morning and at noon and 600 mg in the evening. She reports that some weeks her pain is horrible and other weeks she does not even notice it. She states that she has numbness in the feet that extends to the ankle. She describes her discomfort as a burning tingling sensation in the feet. She describes it as if she is being stung by a bee. She states that she did break her left ankle and just recently got out of her boot. She states that she was coming downstairs and her left hip gave out and she fell. She denies any significant changes to her gait or balance due to her neuropathy. She does not use a cane or walker. She returns today for an evaluation.   REVIEW OF SYSTEMS: Out of a complete 14 system review of symptoms, the patient complains only of the following symptoms, paresthesias, lightheadedness, restless legs,  tingling of bilateral hands and nueopathy and all other reviewed systems are negative.   ALLERGIES: Allergies  Allergen Reactions   Ativan [Lorazepam] Other (See Comments)    confusion   Codeine    Latex    Morphine And Codeine    Nickel    Penicillins    Atorvastatin Other (See Comments)   Cerivastatin Other (See Comments)   Fluvastatin Other (See Comments)    HOME MEDICATIONS: Outpatient Medications Prior to Visit  Medication Sig Dispense Refill   clopidogrel (PLAVIX) 75 MG tablet Take 1 tablet (75 mg total) by mouth daily. 30 tablet 11   escitalopram (LEXAPRO) 20 MG tablet TAKE 1 TABLET BY MOUTH ONCE DAILY 90 tablet 0   FARXIGA 10 MG TABS tablet Take 10 mg by mouth daily.     fluticasone (FLONASE ALLERGY RELIEF) 50 MCG/ACT nasal spray Place 2 sprays into both nostrils as needed.     gabapentin (NEURONTIN) 800 MG tablet Take  one by mouth at morning . Lunch and 2 at night. 360 tablet 1   losartan (COZAAR) 25 MG tablet Take 25 mg by mouth daily.     metFORMIN (GLUCOPHAGE) 500 MG tablet Take 1,000 mg by mouth 2 (two) times daily. At bedtime     pantoprazole (PROTONIX) 40 MG tablet Take 40 mg by mouth daily.     polyethylene glycol (MIRALAX / GLYCOLAX) 17 g packet Take 17 g by mouth daily. 14 each 0   rosuvastatin (CRESTOR) 20 MG tablet Take 1 tablet by mouth daily.     vitamin B-12 (CYANOCOBALAMIN) 1000 MCG tablet Take 1,000 mcg by mouth daily.     No facility-administered medications prior to visit.    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   Personal history of radiation therapy    PONV (postoperative nausea and vomiting)    Radiculopathy of lumbosacral region 02/02/2013    RLS (restless legs syndrome) 02/05/2015   Seasonal allergies     PAST SURGICAL HISTORY: Past Surgical History:  Procedure Laterality Date   BREAST LUMPECTOMY Right 08/07/2020   right lumpectomy w/ radiation   BREAST LUMPECTOMY WITH RADIOACTIVE SEED LOCALIZATION Right 08/07/2020   Procedure: RIGHT BREAST LUMPECTOMY WITH RADIOACTIVE SEED LOCALIZATION;  Surgeon: Emelia Loron, MD;  Location: [facility];  Service: General;  Laterality: Right;   FINGER SURGERY Right 08/31/1996   [facility]   kidney stones  08/31/1978   KNEE SURGERY Bilateral    partial right-2008,left full replacement-2006-Dr [facility]   LAMINECTOMY     l4-5   LOWER EXTREMITY ANGIOGRAPHY Left 12/29/2022   Procedure: Lower Extremity Angiography;  Surgeon: Renford Dills, MD;  Location: [facility];  Service: Cardiovascular;  Laterality: Left;   LUMBAR DISC SURGERY      FAMILY HISTORY: Family History  Problem Relation Age of Onset   Diabetes Son    Breast cancer Neg Hx     SOCIAL HISTORY: Social History   Socioeconomic History   Marital status: Married    Spouse name: Fayrene Fearing   Number of children: 2   Years of education: College   Highest education level: Not on file  Occupational History   Occupation: retired    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   Patient has two adult children.   Patient is retired.   Patient has a college education.   Patient is  right-handed.   Patient drinks two cups of coffee daily.   Social Drivers of Health  Financial Resource Strain: Low Risk  (08/16/2023)   Received from [facility]   Overall Financial Resource Strain (CARDIA)    Difficulty of Paying Living Expenses: Not hard at all  Food Insecurity: No Food Insecurity (08/16/2023)   Received from [facility]   Hunger Vital Sign    Worried About Running Out of Food in the Last Year: Never true    Ran Out of Food in the Last Year: Never true  Transportation Needs: No Transportation Needs (08/16/2023)   Received from [facility] - Transportation    In the past 12 months, has lack of transportation kept you from medical appointments or from getting medications?: No    Lack of Transportation (Non-Medical): No  Physical Activity: Unknown (08/09/2023)   Received from [facility]   Exercise Vital Sign    Days of Exercise per Week: 0 days    Minutes of Exercise per Session: Patient declined  Stress: No Stress Concern Present (08/09/2023)   Received from [facility] - Occupational Stress Questionnaire    Feeling of Stress : Not at all  Social Connections: Socially Integrated (08/09/2023)   Received from [facility]   Social Connection and Isolation Panel [NHANES]    Frequency of Communication with Friends and Family: More than three times a week    Frequency of Social Gatherings with Friends and Family: Once a week    Attends Religious Services: More than 4 times per year    Active Member of [employer] or Organizations: Yes    Attends Engineer, structural: More than 4 times per year    Marital Status: Married  Catering manager Violence: Patient Unable To Answer (07/06/2023)   Humiliation, Afraid, Rape, and Kick questionnaire    Fear of Current or Ex-Partner: Patient unable to answer     Emotionally Abused: Patient unable to answer    Physically Abused: Patient unable to answer    Sexually Abused: Patient unable to answer      PHYSICAL EXAM  There were no vitals filed for this visit.    There is no height or weight on file to calculate BMI.  Generalized: Well developed, in no acute distress  Cardiology: normal rate and rhythm, no murmur noted Respiratory: clear to auscultation bilaterally  Neurological examination  Mentation: Alert oriented to time, place, history taking. Follows all commands speech and language fluent Cranial nerve II-XII: Pupils were equal round reactive to light. Extraocular movements were full, visual field were full . Motor: The motor testing reveals 5 over 5 strength of all 4 extremities. Good symmetric motor tone is noted throughout.  Sensory: Sensory testing is intact to soft touch on all 4 extremities. Gait and station: Gait is normal.No assistive device used.      DIAGNOSTIC DATA (LABS, IMAGING, TESTING) - I reviewed patient records, labs, notes, testing and imaging myself where available.      No data to display           Lab Results  Component Value Date   WBC 3.5 (L) 07/12/2023   HGB 8.2 (L) 07/12/2023   HCT 24.1 (L) 07/12/2023   MCV 95.3 07/12/2023   PLT 142 (L) 07/12/2023      Component Value Date/Time   NA 142 07/10/2023 0422   K 3.6 07/10/2023 0422   K 4.0 09/20/2014 1223   CL 113 (H) 07/10/2023 0422  CO2 21 (L) 07/10/2023 0422   GLUCOSE 110 (H) 07/10/2023 0422   BUN 17 07/10/2023 0422   CREATININE 0.88 07/12/2023 0318   CALCIUM 8.8 (L) 07/10/2023 0422   PROT 6.7 07/06/2023 0536   ALBUMIN 3.9 07/06/2023 0536   AST 26 07/06/2023 0536   ALT 13 07/06/2023 0536   ALKPHOS 31 (L) 07/06/2023 0536   BILITOT 1.2 (H) 07/06/2023 0536   GFRNONAA >60 07/12/2023 0318   No results found for: "CHOL", "HDL", "LDLCALC", "LDLDIRECT", "TRIG", "CHOLHDL" Lab Results  Component Value Date   HGBA1C 6.1 (H) 07/06/2023    Lab Results  Component Value Date   VITAMINB12 443 04/19/2023   Lab Results  Component Value Date   TSH 1.181 07/06/2023       ASSESSMENT AND PLAN 78 y.o. year old female  has a past medical history of Anemia, Anxiety, Arthritis, Cancer (HCC) (05/2020), Complication of anesthesia, CTS (carpal tunnel syndrome), GERD (gastroesophageal reflux disease), Hyperlipemia, Hypertension, Neuropathy in diabetes (HCC) (02/02/2013), Personal history of radiation therapy, PONV (postoperative nausea and vomiting), Radiculopathy of lumbosacral region (02/02/2013), RLS (restless legs syndrome) (02/05/2015), and Seasonal allergies. here with   No diagnosis found.   Eunice Blase is doing fairly well today from a neuropathy standpoint. She continues gabapentin 800mg  in am, 800mg  early afternoon and 1600mg  at bedtime. And alpha lipoic acid supplements daily. She also uses neuropathy cream as needed. We will continue current treatment plan. We have reviewed side effects of gabapentin. She feels that it helps neuropathy and RLS symptoms significantly. She has tolerated gabapentin well. May consider reducing dose if she continues to have difficulty with BP. I have advised she continue monitoring water intake. She will follow up closely with PCP and oncology to discuss symptoms. She was encouraged to make position changes slowly. She will follow up annually, sooner if needed. She verbalizes understanding and agreement with this plan.    No orders of the defined types were placed in this encounter.     No orders of the defined types were placed in this encounter.      Shawnie Dapper, FNP-C 11/15/2023, 12:49 PM [facility] [address], [address] [place], [place] [address] [phone]

## 2023-11-16 ENCOUNTER — Encounter: Payer: Self-pay | Admitting: Family Medicine

## 2023-11-16 ENCOUNTER — Ambulatory Visit (INDEPENDENT_AMBULATORY_CARE_PROVIDER_SITE_OTHER): Payer: Medicare Other | Admitting: Family Medicine

## 2023-11-16 VITALS — BP 132/69 | HR 86 | Resp 15 | Ht 65.5 in | Wt 137.0 lb

## 2023-11-16 DIAGNOSIS — G2581 Restless legs syndrome: Secondary | ICD-10-CM

## 2023-11-16 DIAGNOSIS — B0229 Other postherpetic nervous system involvement: Secondary | ICD-10-CM | POA: Diagnosis not present

## 2023-11-16 DIAGNOSIS — G629 Polyneuropathy, unspecified: Secondary | ICD-10-CM

## 2023-11-16 DIAGNOSIS — R29898 Other symptoms and signs involving the musculoskeletal system: Secondary | ICD-10-CM | POA: Diagnosis not present

## 2023-11-16 MED ORDER — GABAPENTIN 800 MG PO TABS: ORAL_TABLET | ORAL | 3 refills | Status: AC

## 2023-11-16 MED ORDER — AMITRIPTYLINE HCL 10 MG PO TABS: 10.0000 mg | ORAL_TABLET | Freq: Every day | ORAL | 3 refills | Status: DC

## 2023-11-17 ENCOUNTER — Telehealth: Payer: Self-pay | Admitting: Family Medicine

## 2023-11-17 NOTE — Telephone Encounter (Signed)
 Referral for physical therapy fax to [facility]. Phone: [phone], [phone]

## 2023-11-17 NOTE — Telephone Encounter (Signed)
 Referral for occupational therapy fax to [facility]. Phone: [phone], Fax: [phone]

## 2023-12-14 ENCOUNTER — Other Ambulatory Visit (INDEPENDENT_AMBULATORY_CARE_PROVIDER_SITE_OTHER): Payer: Self-pay | Admitting: Vascular Surgery

## 2024-01-18 ENCOUNTER — Encounter (INDEPENDENT_AMBULATORY_CARE_PROVIDER_SITE_OTHER): Payer: Self-pay

## 2024-03-16 ENCOUNTER — Other Ambulatory Visit: Payer: Self-pay | Admitting: Neurosurgery

## 2024-03-16 DIAGNOSIS — M4802 Spinal stenosis, cervical region: Secondary | ICD-10-CM

## 2024-03-16 DIAGNOSIS — Z981 Arthrodesis status: Secondary | ICD-10-CM

## 2024-03-20 ENCOUNTER — Ambulatory Visit
Admission: RE | Admit: 2024-03-20 | Discharge: 2024-03-20 | Disposition: A | Discharge status: Home / Self Care | Source: Ambulatory Visit | Attending: Neurosurgery | Admitting: Neurosurgery

## 2024-03-20 DIAGNOSIS — Z981 Arthrodesis status: Secondary | ICD-10-CM | POA: Diagnosis present

## 2024-03-20 DIAGNOSIS — M4802 Spinal stenosis, cervical region: Secondary | ICD-10-CM | POA: Insufficient documentation

## 2024-04-17 ENCOUNTER — Other Ambulatory Visit: Payer: Self-pay

## 2024-04-17 DIAGNOSIS — Z08 Encounter for follow-up examination after completed treatment for malignant neoplasm: Secondary | ICD-10-CM

## 2024-04-18 ENCOUNTER — Inpatient Hospital Stay (HOSPITAL_BASED_OUTPATIENT_CLINIC_OR_DEPARTMENT_OTHER): Payer: Medicare Other | Admitting: Oncology

## 2024-04-18 ENCOUNTER — Inpatient Hospital Stay: Payer: Medicare Other | Attending: Oncology

## 2024-04-18 ENCOUNTER — Encounter: Payer: Self-pay | Admitting: Oncology

## 2024-04-18 VITALS — BP 153/74 | HR 77 | Temp 96.0°F | Resp 18 | Ht 65.5 in | Wt 139.9 lb

## 2024-04-18 DIAGNOSIS — Z87891 Personal history of nicotine dependence: Secondary | ICD-10-CM | POA: Diagnosis not present

## 2024-04-18 DIAGNOSIS — Z08 Encounter for follow-up examination after completed treatment for malignant neoplasm: Secondary | ICD-10-CM

## 2024-04-18 DIAGNOSIS — D61818 Other pancytopenia: Secondary | ICD-10-CM | POA: Diagnosis present

## 2024-04-18 DIAGNOSIS — Z853 Personal history of malignant neoplasm of breast: Secondary | ICD-10-CM | POA: Diagnosis not present

## 2024-04-18 DIAGNOSIS — Z923 Personal history of irradiation: Secondary | ICD-10-CM | POA: Diagnosis not present

## 2024-04-18 LAB — CMP (CANCER CENTER ONLY)
ALT: 9 U/L (ref 0–44)
AST: 19 U/L (ref 15–41)
Albumin: 4.3 g/dL (ref 3.5–5.0)
Alkaline Phosphatase: 42 U/L (ref 38–126)
Anion gap: 11 (ref 5–15)
BUN: 32 mg/dL — ABNORMAL HIGH (ref 8–23)
CO2: 22 mmol/L (ref 22–32)
Calcium: 9.6 mg/dL (ref 8.9–10.3)
Chloride: 104 mmol/L (ref 98–111)
Creatinine: 1.35 mg/dL — ABNORMAL HIGH (ref 0.44–1.00)
GFR, Estimated: 41 mL/min — ABNORMAL LOW (ref >60)
Glucose, Bld: 100 mg/dL — ABNORMAL HIGH (ref 70–99)
Potassium: 4.2 mmol/L (ref 3.5–5.1)
Sodium: 137 mmol/L (ref 135–145)
Total Bilirubin: 0.7 mg/dL (ref 0.0–1.2)
Total Protein: 7.4 g/dL (ref 6.5–8.1)

## 2024-04-18 LAB — CBC WITH DIFFERENTIAL/PLATELET
Abs Immature Granulocytes: 0.01 K/uL (ref 0.00–0.07)
Basophils Absolute: 0 K/uL (ref 0.0–0.1)
Basophils Relative: 1 %
Eosinophils Absolute: 0.1 K/uL (ref 0.0–0.5)
Eosinophils Relative: 2 %
HCT: 31.2 % — ABNORMAL LOW (ref 36.0–46.0)
Hemoglobin: 10.2 g/dL — ABNORMAL LOW (ref 12.0–15.0)
Immature Granulocytes: 0 %
Lymphocytes Relative: 33 %
Lymphs Abs: 1.1 K/uL (ref 0.7–4.0)
MCH: 32.1 pg (ref 26.0–34.0)
MCHC: 32.7 g/dL (ref 30.0–36.0)
MCV: 98.1 fL (ref 80.0–100.0)
Monocytes Absolute: 0.3 K/uL (ref 0.1–1.0)
Monocytes Relative: 9 %
Neutro Abs: 1.8 K/uL (ref 1.7–7.7)
Neutrophils Relative %: 55 %
Platelets: 160 K/uL (ref 150–400)
RBC: 3.18 MIL/uL — ABNORMAL LOW (ref 3.87–5.11)
RDW: 12.7 % (ref 11.5–15.5)
WBC: 3.3 K/uL — ABNORMAL LOW (ref 4.0–10.5)
nRBC: 0 % (ref 0.0–0.2)

## 2024-04-18 NOTE — Progress Notes (Signed)
 Hematology/Oncology Consult note [facility]  [phone] [phone]  Patient Care Team: Vernon Velna SAUNDERS, MD as PCP - General (Internal Medicine) Lenn Aran, MD as Consulting Physician (Radiation Oncology) Melanee Annah BROCKS, MD as Consulting Physician (Oncology) Ebbie Cough, MD as Consulting Physician (General Surgery)   Name of the patient: Theresa Ferguson  [id number]  1946/05/29   Date of visit: 04/18/24  Diagnosis- 1.  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  History of breast cancer  Chief complaint/ Reason for visit-routine follow-up of breast cancer and pancytopenia  Heme/Onc history:  Patient is a 78 year old female who underwent routine bilateral screening mammogram in October 2021 which showed a possible distortion in the right breast.  This was followed by diagnostic mammogram and ultrasound which showed a 1.1 x 1.3 x 1.2 cm mass at the 9:30 position in the right breast.  No adenopathy noted in the right axilla.  Ultrasound-guided biopsy showed grade 1 ER PR positive HER-2 negative tumor with a Ki-67 of 10%.  Patient has met with Dr. Ebbie and plan is for a lumpectomy.  Based on choosing wisely guidelines sentinel lymph node biopsy was deemed to be not necessary.    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  Grade 2 Ki-67 10% ER 95% PR 95% positive and HER-2 negative by FISH.  Oncotype came back at low risk and patient did not require any adjuvant chemotherapy.  She completed adjuvant radiation treatment and started Arimidex  in March 2022.  She held it in July 2020 due to side effects.  She subsequently stopped all hormone therapy in 2022.   Patient has mild pancytopenia which has remained stable since 2021.  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  Presently she feels well and  denies any specific complaints at this time.  ECOG PS- 1 Pain scale- 0   Review of systems- Review of Systems  Constitutional:  Negative for chills, fever, malaise/fatigue and weight loss.  HENT:  Negative for congestion, ear discharge and nosebleeds.   Eyes:  Negative for blurred vision.  Respiratory:  Negative for cough, hemoptysis, sputum production, shortness of breath and wheezing.   Cardiovascular:  Negative for chest pain, palpitations, orthopnea and claudication.  Gastrointestinal:  Negative for abdominal pain, blood in stool, constipation, diarrhea, heartburn, melena, nausea and vomiting.  Genitourinary:  Negative for dysuria, flank pain, frequency, hematuria and urgency.  Musculoskeletal:  Negative for back pain, joint pain and myalgias.  Skin:  Negative for rash.  Neurological:  Negative for dizziness, tingling, focal weakness, seizures, weakness and headaches.  Endo/Heme/Allergies:  Does not bruise/bleed easily.  Psychiatric/Behavioral:  Negative for depression and suicidal ideas. The patient does not have insomnia.       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   Personal history of radiation therapy    PONV (postoperative nausea and vomiting)    Radiculopathy of lumbosacral region 02/02/2013   RLS (restless legs syndrome) 02/05/2015   Seasonal allergies      Past Surgical History:  Procedure Laterality Date   BREAST  LUMPECTOMY Right 08/07/2020   right lumpectomy w/ radiation   BREAST LUMPECTOMY WITH RADIOACTIVE SEED LOCALIZATION Right 08/07/2020   Procedure: RIGHT BREAST LUMPECTOMY WITH RADIOACTIVE SEED LOCALIZATION;  Surgeon: Ebbie Cough, MD;  Location: [facility];  Service: General;  Laterality: Right;   FINGER SURGERY Right 08/31/1996   [facility]   kidney stones  08/31/1978   KNEE SURGERY Bilateral    partial right-2008,left full replacement-2006-Dr [facility]   LAMINECTOMY     l4-5   LOWER EXTREMITY ANGIOGRAPHY Left 12/29/2022   Procedure: Lower Extremity Angiography;  Surgeon: Jama Cordella MATSU, MD;  Location: [facility];  Service: Cardiovascular;  Laterality: Left;   LUMBAR DISC SURGERY      Social History   Socioeconomic History   Marital status: Married    Spouse name: Lynwood   Number of children: 2   Years of education: College   Highest education level: Not on file  Occupational History   Occupation: retired    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   Patient has two adult children.   Patient is retired.   Patient has a college education.   Patient is right-handed.   Patient drinks two cups of coffee daily.   Social Drivers of Corporate investment banker Strain: Low Risk  (08/16/2023)   Received from [facility]   Overall Financial Resource Strain (CARDIA)    Difficulty of Paying Living Expenses: Not hard  at all  Food Insecurity: No Food Insecurity (08/16/2023)   Received from [facility]   Hunger Vital Sign    Within the past 12 months, you worried that your food would run out before you got the money to buy more.: Never true    Within the past 12 months, the food you bought just didn't last and you didn't have money to get more.: Never true  Transportation Needs: No Transportation Needs (08/16/2023)   Received from [facility] - Transportation    In the past 12 months, has lack of transportation kept you from medical appointments or from getting medications?: No    Lack of Transportation (Non-Medical): No  Physical Activity: Unknown (08/09/2023)   Received from [facility]   Exercise Vital Sign    On average, how many days per week do you engage in moderate to strenuous exercise (like a brisk walk)?: 0 days    On average, how many minutes  do you engage in exercise at this level?: Patient declined  Stress: No Stress Concern Present (08/09/2023)   Received from [facility] - Occupational Stress Questionnaire    Feeling of Stress : Not at all  Social Connections: Socially Integrated (08/09/2023)   Received from [facility]   Social Connection and Isolation Panel    In a typical week, how many times do you talk on the phone with family, friends, or neighbors?: More than three times a week    How often do you get together with friends or relatives?: Once a week    How often do you attend church or religious services?: More than 4 times per year    Do you belong to any clubs or organizations such as church groups, unions, fraternal or athletic groups, or school groups?: Yes    How often do you attend meetings of the clubs or organizations you belong to?: More than 4 times per year    Are you married, widowed, divorced, separated, never married, or living  with a partner?: Married  Intimate Partner Violence: Patient Unable To Answer (07/06/2023)   Humiliation, Afraid, Rape, and Kick questionnaire    Fear of Current or Ex-Partner: Patient unable to answer    Emotionally Abused: Patient unable to answer    Physically Abused: Patient unable to answer    Sexually Abused: Patient unable to answer    Family History  Problem Relation Age of Onset   Diabetes Son    Breast cancer Neg Hx      Current Outpatient Medications:    amitriptyline  (ELAVIL ) 10 MG tablet, Take 1 tablet (10 mg total) by mouth at bedtime., Disp: 90 tablet, Rfl: 3   clopidogrel  (PLAVIX ) 75 MG tablet, TAKE 1 TABLET BY MOUTH DAILY, Disp: 30 tablet, Rfl: 11   escitalopram  (LEXAPRO ) 20 MG tablet, TAKE 1 TABLET BY MOUTH ONCE DAILY, Disp: 90 tablet, Rfl: 0   FARXIGA 10 MG TABS tablet, Take 10 mg by mouth daily., Disp: , Rfl:    ferrous sulfate (QC FERROUS SULFATE) 325 (65 FE) MG tablet, Take 325 mg by mouth daily with breakfast., Disp: , Rfl:    fluticasone  (FLONASE  ALLERGY RELIEF) 50 MCG/ACT nasal spray, Place 2 sprays into both nostrils as needed., Disp: , Rfl:    gabapentin  (NEURONTIN ) 800 MG tablet, Take one by mouth at morning . Lunch and 2 at night., Disp: 360 tablet, Rfl: 3   losartan  (COZAAR ) 25 MG tablet, Take 25 mg by mouth daily., Disp: , Rfl:    magnesium oxide (MAG-OX) 400 (240 Mg) MG tablet, Take 400 mg by mouth daily., Disp: , Rfl:    metFORMIN (GLUCOPHAGE) 500 MG tablet, Take 1,000 mg by mouth 2 (two) times daily. At bedtime, Disp: , Rfl:    pantoprazole  (PROTONIX ) 40 MG tablet, Take 40 mg by mouth daily., Disp: , Rfl:    rosuvastatin  (CRESTOR ) 20 MG tablet, Take 1 tablet by mouth daily., Disp: , Rfl:    vitamin B-12 (CYANOCOBALAMIN ) 1000 MCG tablet, Take 1,000 mcg by mouth daily., Disp: , Rfl:   Physical exam:  Vitals:   04/18/24 1029  BP: (!) 153/74  Pulse: 77  Resp: 18  Temp: (!) 96 F (35.6 C)  TempSrc: Tympanic  SpO2: 100%  Weight: 139 lb 14.4 oz (63.5  kg)  Height: 5' 5.5 (1.664 m)   Physical Exam Cardiovascular:     Rate and Rhythm: Normal rate and regular  rhythm.     Heart sounds: Normal heart sounds.  Pulmonary:     Effort: Pulmonary effort is normal.     Breath sounds: Normal breath sounds.  Skin:    General: Skin is warm and dry.  Neurological:     Mental Status: She is alert and oriented to person, place, and time.    Breast exam was performed in seated and lying down position. Patient is status post right lumpectomy with a well-healed surgical scar. No evidence of any palpable masses. No evidence of axillary adenopathy. No evidence of any palpable masses or lumps in the left breast. No evidence of leftt axillary adenopathy   I have personally reviewed labs listed below:    Latest Ref Rng & Units 04/18/2024   10:07 AM  CMP  Glucose 70 - 99 mg/dL 899   BUN 8 - 23 mg/dL 32   Creatinine 9.55 - 1.00 mg/dL 8.64   Sodium 864 - 854 mmol/L 137   Potassium 3.5 - 5.1 mmol/L 4.2   Chloride 98 - 111 mmol/L 104   CO2 22 - 32 mmol/L 22   Calcium  8.9 - 10.3 mg/dL 9.6   Total Protein 6.5 - 8.1 g/dL 7.4   Total Bilirubin 0.0 - 1.2 mg/dL 0.7   Alkaline Phos 38 - 126 U/L 42   AST 15 - 41 U/L 19   ALT 0 - 44 U/L 9       Latest Ref Rng & Units 04/18/2024   10:07 AM  CBC  WBC 4.0 - 10.5 K/uL 3.3   Hemoglobin 12.0 - 15.0 g/dL 89.7   Hematocrit 63.9 - 46.0 % 31.2   Platelets 150 - 400 K/uL 160    I have personally reviewed Radiology images listed below: No images are attached to the encounter.  MR CERVICAL SPINE WO CONTRAST Result Date: 03/23/2024 CLINICAL DATA:  Left arm numbness.  Prior cervical spine surgery. EXAM: MRI CERVICAL SPINE WITHOUT CONTRAST TECHNIQUE: Multiplanar, multisequence MR imaging of the cervical spine was performed. No intravenous contrast was administered. COMPARISON:  07/08/2023 FINDINGS: Alignment: 2 mm degenerative retrolisthesis at C3-4. Vertebrae: Interval ACDF at C4-C5-C6. Possible Schmorl's node or  slight subsidence along the anterior superior endplate of C4. Type 1 degenerative endplate findings at C6-7. Short pedicles in the cervical spine. Cord: No significant abnormal spinal cord signal is observed. Posterior Fossa, vertebral arteries, paraspinal tissues: Unremarkable Disc levels: C2-3: No impingement.  Degenerative left facet arthropathy. C3-4: Moderate to prominent central stenosis along with moderate left and borderline right foraminal stenosis due to short pedicles, disc bulge, subluxation, and facet arthropathy. C4-5: No impingement.  Fused level. C5-6: Moderate left foraminal stenosis due to intervertebral and facet spurring. Fused level. C6-7: Moderate central stenosis and moderate right foraminal stenosis due to disc osteophyte complex facet arthropathy. Similar to prior. C7-T1: No impingement.  Bilateral degenerative facet arthropathy. IMPRESSION: 1. Interval ACDF at C4-C5-C6. 2. Cervical spondylosis and degenerative disc disease, causing moderate to prominent impingement at C3-4 and moderate impingement at C5-6 and C6-7. 3. Short pedicles in the cervical spine. Electronically Signed   By: Ryan Salvage M.D.   On: 03/23/2024 16:32     Assessment and plan- Patient is a 78 y.o. female who is here for follow-up of following issues:  Pancytopenia: Patient has mild leukopenia with a white cell count that fluctuates between 3-4 for the last 3 years.  Neutrophils remain more than 1.  Hemoglobin has remained stable around 10 and platelet counts fluctuate between 130-150.  There is no consistent downward trend in her counts and therefore her pancytopenia can be monitored conservatively without the need for bone marrow biopsy.  History of stage I year positive right breast cancer: This was diagnosed in 2021 s/p lumpectomy and adjuvant radiation therapy.  She took endocrine therapy for 2 years.  She has now completed nearly 4 years of surveillance.  Clinically she is doing well with no  concerning signs and symptoms of recurrence based on today's exam.  Labs including CBC with differential ferritin and iron  studies in 6 months in 1 year and I will see her back in 1 year   Visit Diagnosis 1. Encounter for follow-up surveillance of breast cancer   2. Other pancytopenia (HCC)      Dr. Annah Skene, MD, MPH [facility] [phone] 04/18/2024 1:00 PM

## 2024-04-19 ENCOUNTER — Other Ambulatory Visit: Payer: Self-pay | Admitting: Internal Medicine

## 2024-04-19 DIAGNOSIS — M8588 Other specified disorders of bone density and structure, other site: Secondary | ICD-10-CM

## 2024-04-20 ENCOUNTER — Other Ambulatory Visit (INDEPENDENT_AMBULATORY_CARE_PROVIDER_SITE_OTHER): Payer: Self-pay | Admitting: Nurse Practitioner

## 2024-04-20 DIAGNOSIS — Z9889 Other specified postprocedural states: Secondary | ICD-10-CM

## 2024-04-25 ENCOUNTER — Ambulatory Visit (INDEPENDENT_AMBULATORY_CARE_PROVIDER_SITE_OTHER): Payer: Medicare Other | Admitting: Nurse Practitioner

## 2024-04-25 ENCOUNTER — Encounter (INDEPENDENT_AMBULATORY_CARE_PROVIDER_SITE_OTHER): Payer: Medicare Other

## 2024-05-02 ENCOUNTER — Ambulatory Visit (INDEPENDENT_AMBULATORY_CARE_PROVIDER_SITE_OTHER)

## 2024-05-02 ENCOUNTER — Ambulatory Visit (INDEPENDENT_AMBULATORY_CARE_PROVIDER_SITE_OTHER): Admitting: Nurse Practitioner

## 2024-05-02 ENCOUNTER — Encounter (INDEPENDENT_AMBULATORY_CARE_PROVIDER_SITE_OTHER): Payer: Self-pay | Admitting: Nurse Practitioner

## 2024-05-02 VITALS — BP 122/78 | HR 80 | Ht 65.5 in | Wt 140.0 lb

## 2024-05-02 DIAGNOSIS — I8310 Varicose veins of unspecified lower extremity with inflammation: Secondary | ICD-10-CM | POA: Diagnosis not present

## 2024-05-02 DIAGNOSIS — I739 Peripheral vascular disease, unspecified: Secondary | ICD-10-CM | POA: Diagnosis not present

## 2024-05-02 DIAGNOSIS — E1169 Type 2 diabetes mellitus with other specified complication: Secondary | ICD-10-CM

## 2024-05-02 DIAGNOSIS — Z9889 Other specified postprocedural states: Secondary | ICD-10-CM | POA: Diagnosis not present

## 2024-05-02 DIAGNOSIS — E782 Mixed hyperlipidemia: Secondary | ICD-10-CM | POA: Diagnosis not present

## 2024-05-02 NOTE — Progress Notes (Signed)
 Subjective:    Patient ID: Theresa Ferguson, female    DOB: Mar 09, 1946, 78 y.o.   MRN: [id number] Chief Complaint  Patient presents with   Follow-up    6 month ABI + R LEA , discuss when she can d/c her blood thinner she was told that she could stop them in year.     Theresa Ferguson is a 78 year old female who presents today for noninvasive studies related to her peripheral arterial disease as well as varicose veins.  At her last visit she had just been discharged for some significant health issues such as developing shingles and then she was hospitalized for an infection in her spine.  She also just underwent surgery open as well.  She was still in the early phases of the recovery process and was still fairly easily fatigued.  Today, the patient notes that she is feeling much better and has resumed much of her everyday activities before her hospitalization.  She denies any significant claudication symptoms.  She denies any pain or discomfort with her varicosities currently.  She denies any rest pain and there are no open wounds or ulcerations.  Previously she had a ABI of 0.73 on the right and 0.99 on the left.  Today there are ABI of 1.11 bilaterally.  Right lower extremity arterial duplex shows multiphasic waveforms throughout.  He has had intervention on the left.  Previous studies show that there was a slight stenosis in the mid to distal portion of her stent.     Review of Systems  Cardiovascular:  Negative for leg swelling.  Neurological:  Positive for numbness.  All other systems reviewed and are negative.      Objective:   Physical Exam Vitals reviewed.  HENT:     Head: Normocephalic.  Cardiovascular:     Rate and Rhythm: Normal rate.     Pulses:          Dorsalis pedis pulses are 1+ on the right side and 1+ on the left side.  Pulmonary:     Effort: Pulmonary effort is normal.  Skin:    General: Skin is warm and dry.  Neurological:     Mental Status: She is alert and  oriented to person, place, and time.  Psychiatric:        Mood and Affect: Mood normal.        Behavior: Behavior normal.        Thought Content: Thought content normal.        Judgment: Judgment normal.     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   Personal history of radiation therapy    PONV (postoperative nausea and vomiting)    Radiculopathy of lumbosacral region 02/02/2013   RLS (restless legs syndrome) 02/05/2015   Seasonal allergies     Social History   Socioeconomic History   Marital status: Married    Spouse name: Lynwood   Number of children: 2   Years of education: [employer]  Highest education level: Not on file  Occupational History   Occupation: retired    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   Patient has two adult children.   Patient is retired.   Patient has a college education.   Patient is right-handed.   Patient drinks two cups of coffee daily.   Social Drivers of Corporate investment banker Strain: Low Risk  (08/16/2023)   Received from [facility]    Overall Financial Resource Strain (CARDIA)    Difficulty of Paying Living Expenses: Not hard at all  Food Insecurity: No Food Insecurity (08/16/2023)   Received from [facility]   Hunger Vital Sign    Within the past 12 months, you worried that your food would run out before you got the money to buy more.: Never true    Within the past 12 months, the food you bought just didn't last and you didn't have money to get more.: Never true  Transportation Needs: No Transportation Needs (08/16/2023)   Received from [facility] - Transportation    In the past 12 months, has lack of transportation kept you from medical appointments or from getting medications?: No    Lack of Transportation (Non-Medical): No  Physical Activity: Unknown (08/09/2023)   Received from [facility]   Exercise Vital Sign    On average, how many days per week do you engage in moderate to strenuous exercise (like a brisk walk)?: 0 days    On average, how many minutes do you engage in exercise at this level?: Patient declined  Stress: No Stress Concern Present (08/09/2023)   Received from [facility] - Occupational Stress Questionnaire    Feeling of Stress : Not at all  Social Connections: Socially Integrated (08/09/2023)   Received from [facility]   Social Connection and Isolation Panel    In a typical week, how many times do you talk on the phone with family, friends, or neighbors?: More than three times a week    How often do you get together with friends or relatives?: Once a week    How often do you attend church or religious services?: More than 4 times per year    Do you belong to any clubs or organizations such as church groups, unions, fraternal or athletic groups, or school groups?: Yes    How often do you attend meetings of the clubs or organizations you belong to?: More than  4 times per year    Are you married, widowed, divorced, separated, never married, or living with a partner?: Married  Intimate Partner Violence: Patient Unable To Answer (07/06/2023)   Humiliation, Afraid, Rape, and Kick questionnaire    Fear of Current or Ex-Partner: Patient unable to answer  Emotionally Abused: Patient unable to answer    Physically Abused: Patient unable to answer    Sexually Abused: Patient unable to answer    Past Surgical History:  Procedure Laterality Date   BREAST LUMPECTOMY Right 08/07/2020   right lumpectomy w/ radiation   BREAST LUMPECTOMY WITH RADIOACTIVE SEED LOCALIZATION Right 08/07/2020   Procedure: RIGHT BREAST LUMPECTOMY WITH RADIOACTIVE SEED LOCALIZATION;  Surgeon: Ebbie Cough, MD;  Location: [facility];  Service: General;  Laterality: Right;   FINGER SURGERY Right 08/31/1996   [facility]   kidney stones  08/31/1978   KNEE SURGERY Bilateral    partial right-2008,left full replacement-2006-Dr [facility]   LAMINECTOMY     l4-5   LOWER EXTREMITY ANGIOGRAPHY Left 12/29/2022   Procedure: Lower Extremity Angiography;  Surgeon: Jama Cordella MATSU, MD;  Location: [facility];  Service: Cardiovascular;  Laterality: Left;   LUMBAR DISC SURGERY      Family History  Problem Relation Age of Onset   Diabetes Son    Breast cancer Neg Hx     Allergies  Allergen Reactions   Ativan  [Lorazepam ] Other (See Comments)    confusion   Codeine    Latex    Morphine And Codeine    Nickel    Penicillins    Atorvastatin Other (See Comments)   Cerivastatin Other (See Comments)   Fluvastatin Other (See Comments)       Latest Ref Rng & Units 04/18/2024   10:07 AM 07/12/2023    3:18 AM 07/11/2023    3:28 AM  CBC  WBC 4.0 - 10.5 K/uL 3.3  3.5  3.4   Hemoglobin 12.0 - 15.0 g/dL 89.7  8.2  7.9   Hematocrit 36.0 - 46.0 % 31.2  24.1  23.7   Platelets 150 - 400 K/uL 160  142  138       CMP     Component Value Date/Time   NA  137 04/18/2024 1007   K 4.2 04/18/2024 1007   K 4.0 09/20/2014 1223   CL 104 04/18/2024 1007   CO2 22 04/18/2024 1007   GLUCOSE 100 (H) 04/18/2024 1007   BUN 32 (H) 04/18/2024 1007   CREATININE 1.35 (H) 04/18/2024 1007   CALCIUM  9.6 04/18/2024 1007   PROT 7.4 04/18/2024 1007   ALBUMIN 4.3 04/18/2024 1007   AST 19 04/18/2024 1007   ALT 9 04/18/2024 1007   ALKPHOS 42 04/18/2024 1007   BILITOT 0.7 04/18/2024 1007   GFRNONAA 41 (L) 04/18/2024 1007     No results found.     Assessment & Plan:   1. Peripheral arterial disease with history of revascularization (HCC) (Primary) Currently at this time patient is not having any disabling claudication, rest pain or open wounds or ulcerations.  Her ABIs appear to have recovered and this may be possible from her increased activity.  Given that she does not have any significant issues or symptoms I feel it is prudent for her to follow-up on an annual basis or sooner if she begins to experience worsening issues such as claudication or rest pain.  2. Type 2 diabetes mellitus with other specified complication, unspecified whether long term insulin  use (HCC) Continue hypoglycemic medications as already ordered, these medications have been reviewed and there are no changes at this time.  Hgb A1C to be monitored as already arranged by primary service  3. Mixed hyperlipidemia Continue statin as ordered and reviewed, no changes at this time   4. Varicose veins with inflammation  Currently at this time she has not been having any significant issues with her veins, especially given her recent recovery.  At this time because they are not giving her significant issues we discussed the use of conservative therapy.   Current Outpatient Medications on File Prior to Visit  Medication Sig Dispense Refill   clopidogrel  (PLAVIX ) 75 MG tablet TAKE 1 TABLET BY MOUTH DAILY 30 tablet 11   escitalopram  (LEXAPRO ) 20 MG tablet TAKE 1 TABLET BY MOUTH ONCE DAILY 90  tablet 0   FARXIGA 10 MG TABS tablet Take 10 mg by mouth daily.     ferrous sulfate (QC FERROUS SULFATE) 325 (65 FE) MG tablet Take 325 mg by mouth daily with breakfast.     fluticasone  (FLONASE  ALLERGY RELIEF) 50 MCG/ACT nasal spray Place 2 sprays into both nostrils as needed.     gabapentin  (NEURONTIN ) 800 MG tablet Take one by mouth at morning . Lunch and 2 at night. 360 tablet 3   magnesium oxide (MAG-OX) 400 (240 Mg) MG tablet Take 400 mg by mouth daily.     metFORMIN (GLUCOPHAGE) 500 MG tablet Take 1,000 mg by mouth 2 (two) times daily. At bedtime     pantoprazole  (PROTONIX ) 40 MG tablet Take 40 mg by mouth daily.     rosuvastatin  (CRESTOR ) 20 MG tablet Take 1 tablet by mouth daily.     vitamin B-12 (CYANOCOBALAMIN ) 1000 MCG tablet Take 1,000 mcg by mouth daily.     No current facility-administered medications on file prior to visit.    There are no Patient Instructions on file for this visit. No follow-ups on file.   Pinchas Reither E Lunell Robart, NP

## 2024-05-05 LAB — VAS US ABI WITH/WO TBI
Left ABI: 1.11
Right ABI: 1.11

## 2024-05-23 NOTE — Patient Instructions (Incomplete)
 Below is our plan:  We will continue gabapentin  800mg  in the mornings, 800 at lunch and 1600mg  in the evenings. Try wearing a wrist brace for carpal tunnel for the next couple of weeks. Talk to your orthopedic doctor about possible carpal tunnel. We will refer you to PT is you wish.   Please make sure you are staying well hydrated. I recommend 50-60 ounces daily. Well balanced diet and regular exercise encouraged. Consistent sleep schedule with 6-8 hours recommended.   Please continue follow up with care team as directed.   Follow up with me in 6 months   You may receive a survey regarding today's visit. I encourage you to leave honest feed back as I do use this information to improve patient care. Thank you for seeing me today!

## 2024-05-23 NOTE — Progress Notes (Unsigned)
 PATIENT: Theresa Ferguson DOB: 05/21/1946  REASON FOR VISIT: follow up HISTORY FROM: patient  No chief complaint on file.   HISTORY OF PRESENT ILLNESS:  05/24/2024 ALL:  Theresa Ferguson returns for follow up for RLS and neuropathy. She was last seen 10/2023 and we continued gabapentin  and added amitriptyline  for post herpetic pain. Referrals placed to PT/OT. Since,   Weakness of right hand?  11/16/2023 ALL:  Theresa Ferguson returns for follow up for RLS and neuropathy. She was last seen 01/2022 and doing well on gabapentin  800mg  in am, at lunch and 1600mg  in evenings.   She continues to do well from a standpoint of RLS and neuropathy of bilateral feet. She is toelrating gabapentin  without any obvious adverse effects.   She is s/p C4C5, C5C6 ACDF 08/16/2023. Prior to that she was hospitalized with herpes zoster with possible infection, neurological involvement. Rash started on left  first finger and hand and moved up the entire arm. She continues to have numbness of anterior aspect of left arm. Occasional burning. She describes a deep achy feeling of left index finger and thumb. She reports not being able to grip with left hand. She is dropping items when trying to hold with left hand. Symptoms started after shingles but before surgery. After surgery she feels it has worsened. She has follow up with NS next month.   02/25/2022 ALL: Theresa Ferguson returns for follow up for RLS and neuropathy. She continues 800mg  in am, 800mg  at lunch and 1600mg  in evening. She feels gabapentin  really helps with both restless leg sensation and burning stinging pain. She does have intermittent numbness. She does not think she continued alpha lipoic acid but has continued neuropathy cream as needed that helps. She is going well. She has started using a cane for stability. She is followed closely by her care team. She discontinued Arimidex . She could not tolerate side effects. She has routine mammograms and blood work. She continues to have  labile BP. She is working closely with PCP. She continues lisinopril-HCTZ. She is staying hydrated.   02/25/2021 ALL: Theresa Ferguson returns for RLS follow up. She continues gabapentin  800mg  in am, 800mg  early afternoon and 1600mg  at bedtime. She added alpha lipoic acid supplements last year that have helped. She also uses neuropathy cream as needed. She reports that symptoms are stable. She did have a period of worsening about 3 months ago but feels that pain and discomfort has improved back to baseline. She sleeps well.   She had an abnormal mammogram in 05/2020. 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 No adjuvant chemo, she was started on Arimidex . She is tolerating fairly well but has noticed some new symptoms. She started having hot flashes within a week of starting the medication. Her husband has told her that she is a little more moody than normal. She describes an electrical feeling that shoots through her body from time to time. This sensation is usually associated with feeling a little lightheaded and like she has tunnel vision. Symptoms usually occur when standing, sometimes after sitting but not always. Symptoms last about 30 seconds to a minute and then resolve. No syncopal events. No falls. She had last event yesterday after being outside working in her yard. She was sweating and clothes were soaked. When she went inside to take a break she felt a shock go through her body and felt a little lightheaded. After getting inside and resting, symptoms resolved. BP was  106/60. Usually 120's-140/70's. She admits that she does not drink very much water. She denies chest pain or shob. No palpitations. She has follow up with PCP next month and oncology in August.   02/26/2020 ALL: Theresa Ferguson is a 78 y.o. female here today for follow up for RLS and neuropathy. She is doing ok. She continues to have numbness and tingling of  bilateral feet. Symptoms wax and wane. She does still have restless leg symptoms as well. Some days are better than others. Neuropathy cream does seem to help. She has noticed tingling of both hands recently. Symptoms seem to have come about over the past 2-3 months. She is s/p internal fixation with titanium rod placement of right wrist in 10/2018 for fractured wrist. She has also had hand surgery for removal of nodules in the past. She has neck tenderness but no significant pain. She is followed by Dr Josefina. She is very active. PCP follows labs twice a year. She continues iron , B12 and magnesium supplements. Labs were last checked in 09/2019. Last A1C was 6.4.   Theresa Ferguson.   HISTORY: (copied from my note on 02/22/2019)  Theresa Ferguson is a 78 y.o. female here today for follow up for RLS and neuropathy. She continues gabapentin  800mg  in the am and noon and 1600mg  at night. She is also using compounded cream as needed. During the summer months she is able to exercise more and does not need the cream as often. She has a pool at her home that she uses for exercise. She is doing well today and without complaints.   History (copied from [facility] on 06/14/2018)   Ms. Dralle is a 78 year old female with a history of neuropathy and restless leg syndrome.  She returns today for follow-up.  She is taking 800 mg of gabapentin  in the morning and at noon and 1600 mg at bedtime.  She reports that she has good days and bad days.  She states that she has about 4 bad days as of this.  She states sometimes been tingling sensation travels up the leg.  She denies any significant changes with her gait or balance.  She reports that she was unable to get the compounded cream from [facility] because of the cost  She states that her pharmacy does compounded cream and is asking that I sent her prescription there.  She returns today for follow-up.   HISTORY  06/14/17 Ms. Friebel is a 78 year old female with a history of neuropathy and restless leg syndrome. She returns today for follow-up. She is currently taking gabapentin  800 mg in the morning and at noon and 600 mg in the evening. She reports that some weeks her pain is horrible and other weeks she does not even notice it. She states that she has numbness in the feet that extends to the ankle. She describes her discomfort as a burning tingling sensation in the feet. She describes it as if she is being stung by a bee. She states that she did break her left ankle and just recently got out of her boot. She states that she was coming downstairs and her left hip gave out and she fell. She denies any significant changes to her gait or balance due to her neuropathy. She does not use a cane or walker. She returns today for an evaluation.   REVIEW OF SYSTEMS: Out of a complete 14 system review of symptoms, the patient complains only of  the following symptoms, paresthesias, lightheadedness, restless legs, tingling of bilateral hands and nueopathy and all other reviewed systems are negative.   ALLERGIES: Allergies  Allergen Reactions   Ativan  [Lorazepam ] Other (See Comments)    confusion   Codeine    Latex    Morphine And Codeine    Nickel    Penicillins    Atorvastatin Other (See Comments)   Cerivastatin Other (See Comments)   Fluvastatin Other (See Comments)    HOME MEDICATIONS: Outpatient Medications Prior to Visit  Medication Sig Dispense Refill   clopidogrel  (PLAVIX ) 75 MG tablet TAKE 1 TABLET BY MOUTH DAILY 30 tablet 11   escitalopram  (LEXAPRO ) 20 MG tablet TAKE 1 TABLET BY MOUTH ONCE DAILY 90 tablet 0   FARXIGA 10 MG TABS tablet Take 10 mg by mouth daily.     ferrous sulfate (QC FERROUS SULFATE) 325 (65 FE) MG tablet Take 325 mg by mouth daily with breakfast.     fluticasone  (FLONASE  ALLERGY RELIEF) 50 MCG/ACT nasal spray Place 2 sprays into both nostrils as needed.     gabapentin  (NEURONTIN )  800 MG tablet Take one by mouth at morning . Lunch and 2 at night. 360 tablet 3   magnesium oxide (MAG-OX) 400 (240 Mg) MG tablet Take 400 mg by mouth daily.     metFORMIN (GLUCOPHAGE) 500 MG tablet Take 1,000 mg by mouth 2 (two) times daily. At bedtime     pantoprazole  (PROTONIX ) 40 MG tablet Take 40 mg by mouth daily.     rosuvastatin  (CRESTOR ) 20 MG tablet Take 1 tablet by mouth daily.     vitamin B-12 (CYANOCOBALAMIN ) 1000 MCG tablet Take 1,000 mcg by mouth daily.     No facility-administered medications prior to visit.    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   Personal history of radiation therapy    PONV (postoperative nausea and vomiting)    Radiculopathy of lumbosacral region 02/02/2013   RLS (restless legs syndrome) 02/05/2015   Seasonal allergies     PAST SURGICAL HISTORY: Past Surgical History:  Procedure Laterality Date   BREAST LUMPECTOMY Right 08/07/2020   right lumpectomy w/ radiation   BREAST LUMPECTOMY WITH RADIOACTIVE SEED LOCALIZATION Right 08/07/2020   Procedure: RIGHT BREAST LUMPECTOMY WITH RADIOACTIVE SEED LOCALIZATION;  Surgeon: Ebbie Cough, MD;  Location: [facility];  Service: General;  Laterality: Right;   FINGER SURGERY Right 08/31/1996   [facility]   kidney stones  08/31/1978   KNEE SURGERY Bilateral    partial right-2008,left full replacement-2006-Dr [facility]   LAMINECTOMY     l4-5   LOWER EXTREMITY ANGIOGRAPHY Left 12/29/2022   Procedure: Lower Extremity Angiography;  Surgeon: Jama Cordella MATSU, MD;  Location: [facility];  Service: Cardiovascular;  Laterality: Left;   LUMBAR DISC SURGERY      FAMILY  HISTORY: Family History  Problem Relation Age of Onset   Diabetes Son    Breast cancer Neg Hx     SOCIAL HISTORY: Social History   Socioeconomic History   Marital status: Married    Spouse name: Lynwood   Number of children: 2   Years  of education: College   Highest education level: Not on file  Occupational History   Occupation: retired    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   Patient has two adult children.   Patient is retired.   Patient has a college education.   Patient is right-handed.   Patient drinks two cups of coffee daily.   Social Drivers of Corporate investment banker Strain: Low Risk  (08/16/2023)   Received from [facility]   Overall Financial Resource Strain (CARDIA)    Difficulty of Paying Living Expenses: Not hard at all  Food Insecurity: No Food Insecurity (08/16/2023)   Received from [facility]   Hunger Vital Sign    Within the past 12 months, you worried that your food would run out before you got the money to buy more.: Never true    Within the past 12 months, the food you bought just didn't last and you didn't have money to get more.: Never true  Transportation Needs: No Transportation Needs (08/16/2023)   Received from [facility] - Transportation    In the past 12 months, has lack of transportation kept you from medical appointments or from getting medications?:  No    Lack of Transportation (Non-Medical): No  Physical Activity: Unknown (08/09/2023)   Received from [facility]   Exercise Vital Sign    On average, how many days per week do you engage in moderate to strenuous exercise (like a brisk walk)?: 0 days    On average, how many minutes do you engage in exercise at this level?: Patient declined  Stress: No Stress Concern Present (08/09/2023)   Received from [facility] - Occupational Stress Questionnaire    Feeling of Stress : Not at all  Social Connections: Socially Integrated (08/09/2023)   Received from [facility]   Social Connection and Isolation Panel    In a typical week, how many times do you talk on the phone with family, friends, or neighbors?: More than three times a week    How often do you get together with friends or relatives?: Once a week    How often do you attend church or religious services?: More than 4 times per year    Do you belong to any clubs or organizations such as church groups, unions, fraternal or athletic groups, or school groups?: Yes    How often do you attend meetings of the clubs or organizations you belong to?: More than 4 times per year    Are you married, widowed, divorced, separated, never married, or living with a partner?: Married  Intimate Partner Violence: Patient Unable To Answer (07/06/2023)   Humiliation, Afraid, Rape, and Kick questionnaire    Fear of Current or Ex-Partner:  Patient unable to answer    Emotionally Abused: Patient unable to answer    Physically Abused: Patient unable to answer    Sexually Abused: Patient unable to answer      PHYSICAL EXAM  There were no vitals filed for this visit.     There is no height or weight on file to calculate BMI.  Generalized: Well developed, in no acute distress  Cardiology: normal rate and rhythm, no murmur noted Respiratory: clear to auscultation  bilaterally  Neurological examination  Mentation: Alert oriented to time, place, history taking. Follows all commands speech and language fluent Cranial nerve II-XII: Pupils were equal round reactive to light. Extraocular movements were full, visual field were full . Motor: The motor testing reveals 5 over 5 strength of all 4 extremities with exception of 3+/5 left hand grip. Good symmetric motor tone is noted throughout.  Sensory: Sensory testing is intact to soft touch on all 4 extremities with exception of increased sensitivity to left anterior arm.  Gait and station: Gait is normal. No assistive device used.      DIAGNOSTIC DATA (LABS, IMAGING, TESTING) - I reviewed patient records, labs, notes, testing and imaging myself where available.      No data to display           Lab Results  Component Value Date   WBC 3.3 (L) 04/18/2024   HGB 10.2 (L) 04/18/2024   HCT 31.2 (L) 04/18/2024   MCV 98.1 04/18/2024   PLT 160 04/18/2024      Component Value Date/Time   NA 137 04/18/2024 1007   K 4.2 04/18/2024 1007   K 4.0 09/20/2014 1223   CL 104 04/18/2024 1007   CO2 22 04/18/2024 1007   GLUCOSE 100 (H) 04/18/2024 1007   BUN 32 (H) 04/18/2024 1007   CREATININE 1.35 (H) 04/18/2024 1007   CALCIUM  9.6 04/18/2024 1007   PROT 7.4 04/18/2024 1007   ALBUMIN 4.3 04/18/2024 1007   AST 19 04/18/2024 1007   ALT 9 04/18/2024 1007   ALKPHOS 42 04/18/2024 1007   BILITOT 0.7 04/18/2024 1007   GFRNONAA 41 (L) 04/18/2024 1007   No results found for: CHOL, HDL, LDLCALC, LDLDIRECT, TRIG, CHOLHDL Lab Results  Component Value Date   HGBA1C 6.1 (H) 07/06/2023   Lab Results  Component Value Date   VITAMINB12 443 04/19/2023   Lab Results  Component Value Date   TSH 1.181 07/06/2023       ASSESSMENT AND PLAN 78 y.o. year old female  has a past medical history of Anemia, Anxiety, Arthritis, Cancer (HCC) (05/2020), Complication of anesthesia, CTS (carpal tunnel syndrome),  GERD (gastroesophageal reflux disease), Hyperlipemia, Hypertension, Neuropathy in diabetes (HCC) (02/02/2013), Personal history of radiation therapy, PONV (postoperative nausea and vomiting), Radiculopathy of lumbosacral region (02/02/2013), RLS (restless legs syndrome) (02/05/2015), and Seasonal allergies. here with   No diagnosis found.   Theresa Ferguson is doing fairly well today from a neuropathy standpoint. She continues gabapentin  800mg  in am, 800mg  early afternoon and 1600mg  at bedtime. And alpha lipoic acid supplements daily. She also uses neuropathy cream as needed. We will continue current treatment plan. We have reviewed side effects of gabapentin . She feels that it helps neuropathy and RLS symptoms significantly. She has tolerated gabapentin  well. May consider reducing dose in future. We have discussed NCS/EMG for post herpetic numbness and pain. We will consider in future, if needed. I will have her start amitriptyline   I have advised starting 10mg  at bedtime. We discussed concerns with  taking while taking escitalopram  and she will monitor closely for adverse effects. I will refer her to PT/OT for eval and treatment of right hand weakness. She will follow up closely with care team. She was encouraged to make position changes slowly. She will follow up in 6 months, sooner if needed. She verbalizes understanding and agreement with this plan.    No orders of the defined types were placed in this encounter.     No orders of the defined types were placed in this encounter.      Greig Forbes, FNP-C 05/23/2024, 4:26 PM [facility] [address] [place], [place] [address] [phone]

## 2024-05-24 ENCOUNTER — Encounter: Payer: Self-pay | Admitting: Family Medicine

## 2024-05-24 ENCOUNTER — Ambulatory Visit (INDEPENDENT_AMBULATORY_CARE_PROVIDER_SITE_OTHER): Admitting: Family Medicine

## 2024-05-24 VITALS — BP 132/85 | HR 78 | Wt 140.0 lb

## 2024-05-24 DIAGNOSIS — R29898 Other symptoms and signs involving the musculoskeletal system: Secondary | ICD-10-CM

## 2024-05-24 DIAGNOSIS — G629 Polyneuropathy, unspecified: Secondary | ICD-10-CM | POA: Diagnosis not present

## 2024-05-24 DIAGNOSIS — G2581 Restless legs syndrome: Secondary | ICD-10-CM | POA: Diagnosis not present

## 2024-05-24 DIAGNOSIS — N1831 Chronic kidney disease, stage 3a: Secondary | ICD-10-CM | POA: Insufficient documentation

## 2024-05-24 DIAGNOSIS — M8580 Other specified disorders of bone density and structure, unspecified site: Secondary | ICD-10-CM | POA: Insufficient documentation

## 2024-05-24 DIAGNOSIS — C4490 Unspecified malignant neoplasm of skin, unspecified: Secondary | ICD-10-CM | POA: Insufficient documentation

## 2024-05-24 DIAGNOSIS — B0229 Other postherpetic nervous system involvement: Secondary | ICD-10-CM

## 2024-05-24 DIAGNOSIS — F39 Unspecified mood [affective] disorder: Secondary | ICD-10-CM | POA: Insufficient documentation

## 2024-10-19 ENCOUNTER — Other Ambulatory Visit

## 2024-12-25 ENCOUNTER — Ambulatory Visit: Admitting: Family Medicine

## 2025-04-17 ENCOUNTER — Other Ambulatory Visit

## 2025-04-17 ENCOUNTER — Ambulatory Visit: Admitting: Oncology

## 2025-05-02 ENCOUNTER — Encounter (INDEPENDENT_AMBULATORY_CARE_PROVIDER_SITE_OTHER)

## 2025-05-02 ENCOUNTER — Ambulatory Visit (INDEPENDENT_AMBULATORY_CARE_PROVIDER_SITE_OTHER): Admitting: Nurse Practitioner
